# Patient Record
Sex: Male | Born: 1940 | Race: Black or African American | Hispanic: No | Marital: Single | Smoking: Never smoker
Health system: Southern US, Community
[De-identification: ages and names within clinical notes are randomized; demographics above are authoritative.]

## PROBLEM LIST (undated history)

## (undated) DIAGNOSIS — I10 Essential (primary) hypertension: Secondary | ICD-10-CM

## (undated) DIAGNOSIS — K3184 Gastroparesis: Secondary | ICD-10-CM

## (undated) DIAGNOSIS — E1143 Type 2 diabetes mellitus with diabetic autonomic (poly)neuropathy: Secondary | ICD-10-CM

## (undated) DIAGNOSIS — K5790 Diverticulosis of intestine, part unspecified, without perforation or abscess without bleeding: Secondary | ICD-10-CM

## (undated) DIAGNOSIS — K219 Gastro-esophageal reflux disease without esophagitis: Secondary | ICD-10-CM

## (undated) DIAGNOSIS — I864 Gastric varices: Secondary | ICD-10-CM

## (undated) DIAGNOSIS — K859 Acute pancreatitis without necrosis or infection, unspecified: Secondary | ICD-10-CM

## (undated) DIAGNOSIS — K2970 Gastritis, unspecified, without bleeding: Secondary | ICD-10-CM

## (undated) DIAGNOSIS — B1920 Unspecified viral hepatitis C without hepatic coma: Secondary | ICD-10-CM

## (undated) DIAGNOSIS — C189 Malignant neoplasm of colon, unspecified: Secondary | ICD-10-CM

## (undated) DIAGNOSIS — D649 Anemia, unspecified: Secondary | ICD-10-CM

## (undated) DIAGNOSIS — B370 Candidal stomatitis: Secondary | ICD-10-CM

## (undated) DIAGNOSIS — K635 Polyp of colon: Secondary | ICD-10-CM

## (undated) DIAGNOSIS — C801 Malignant (primary) neoplasm, unspecified: Secondary | ICD-10-CM

## (undated) DIAGNOSIS — H269 Unspecified cataract: Secondary | ICD-10-CM

## (undated) DIAGNOSIS — K7460 Unspecified cirrhosis of liver: Secondary | ICD-10-CM

## (undated) DIAGNOSIS — K469 Unspecified abdominal hernia without obstruction or gangrene: Secondary | ICD-10-CM

## (undated) DIAGNOSIS — K8020 Calculus of gallbladder without cholecystitis without obstruction: Secondary | ICD-10-CM

## (undated) DIAGNOSIS — K922 Gastrointestinal hemorrhage, unspecified: Secondary | ICD-10-CM

## (undated) HISTORY — DX: Polyp of colon: K63.5

## (undated) HISTORY — DX: Unspecified abdominal hernia without obstruction or gangrene: K46.9

## (undated) HISTORY — DX: Acute pancreatitis without necrosis or infection, unspecified: K85.90

## (undated) HISTORY — DX: Gastroparesis: K31.84

## (undated) HISTORY — DX: Candidal stomatitis: B37.0

## (undated) HISTORY — PX: HERNIA REPAIR: SHX51

## (undated) HISTORY — DX: Malignant neoplasm of colon, unspecified: C18.9

## (undated) HISTORY — DX: Type 2 diabetes mellitus with diabetic autonomic (poly)neuropathy: E11.43

## (undated) HISTORY — DX: Unspecified cirrhosis of liver: K74.60

## (undated) HISTORY — DX: Gastrointestinal hemorrhage, unspecified: K92.2

## (undated) HISTORY — DX: Gastro-esophageal reflux disease without esophagitis: K21.9

## (undated) HISTORY — PX: COLON SURGERY: SHX602

## (undated) HISTORY — DX: Gastritis, unspecified, without bleeding: K29.70

## (undated) HISTORY — DX: Diverticulosis of intestine, part unspecified, without perforation or abscess without bleeding: K57.90

## (undated) HISTORY — DX: Gastric varices: I86.4

## (undated) HISTORY — DX: Essential (primary) hypertension: I10

## (undated) HISTORY — DX: Calculus of gallbladder without cholecystitis without obstruction: K80.20

## (undated) HISTORY — DX: Unspecified cataract: H26.9

## (undated) HISTORY — DX: Unspecified viral hepatitis C without hepatic coma: B19.20

## (undated) HISTORY — DX: Anemia, unspecified: D64.9

---

## 1998-04-25 ENCOUNTER — Emergency Department (HOSPITAL_COMMUNITY): Admission: EM | Admit: 1998-04-25 | Discharge: 1998-04-25 | Payer: Self-pay | Admitting: Emergency Medicine

## 1998-05-04 ENCOUNTER — Encounter: Admission: RE | Admit: 1998-05-04 | Discharge: 1998-05-04 | Payer: Self-pay | Admitting: Hematology and Oncology

## 1998-06-15 ENCOUNTER — Encounter: Admission: RE | Admit: 1998-06-15 | Discharge: 1998-06-15 | Payer: Self-pay | Admitting: Internal Medicine

## 1998-09-26 ENCOUNTER — Emergency Department (HOSPITAL_COMMUNITY): Admission: EM | Admit: 1998-09-26 | Discharge: 1998-09-26 | Payer: Self-pay | Admitting: Emergency Medicine

## 1998-10-20 ENCOUNTER — Encounter: Admission: RE | Admit: 1998-10-20 | Discharge: 1998-10-20 | Payer: Self-pay | Admitting: Internal Medicine

## 1999-01-16 ENCOUNTER — Encounter: Admission: RE | Admit: 1999-01-16 | Discharge: 1999-01-16 | Payer: Self-pay | Admitting: Internal Medicine

## 1999-01-19 ENCOUNTER — Ambulatory Visit (HOSPITAL_COMMUNITY): Admission: RE | Admit: 1999-01-19 | Discharge: 1999-01-19 | Payer: Self-pay | Admitting: Internal Medicine

## 1999-02-10 ENCOUNTER — Ambulatory Visit (HOSPITAL_BASED_OUTPATIENT_CLINIC_OR_DEPARTMENT_OTHER): Admission: RE | Admit: 1999-02-10 | Discharge: 1999-02-10 | Payer: Self-pay | Admitting: Surgery

## 1999-05-08 ENCOUNTER — Encounter: Admission: RE | Admit: 1999-05-08 | Discharge: 1999-05-08 | Payer: Self-pay | Admitting: Internal Medicine

## 1999-11-28 ENCOUNTER — Encounter: Admission: RE | Admit: 1999-11-28 | Discharge: 1999-11-28 | Payer: Self-pay | Admitting: Internal Medicine

## 1999-12-11 ENCOUNTER — Encounter: Payer: Self-pay | Admitting: Emergency Medicine

## 1999-12-11 ENCOUNTER — Encounter: Payer: Self-pay | Admitting: *Deleted

## 1999-12-11 ENCOUNTER — Encounter (INDEPENDENT_AMBULATORY_CARE_PROVIDER_SITE_OTHER): Payer: Self-pay | Admitting: *Deleted

## 1999-12-11 ENCOUNTER — Inpatient Hospital Stay (HOSPITAL_COMMUNITY): Admission: EM | Admit: 1999-12-11 | Discharge: 1999-12-14 | Payer: Self-pay | Admitting: Emergency Medicine

## 1999-12-19 ENCOUNTER — Encounter: Admission: RE | Admit: 1999-12-19 | Discharge: 1999-12-19 | Payer: Self-pay | Admitting: Internal Medicine

## 2000-01-18 ENCOUNTER — Encounter: Admission: RE | Admit: 2000-01-18 | Discharge: 2000-01-18 | Payer: Self-pay | Admitting: Internal Medicine

## 2000-10-07 ENCOUNTER — Encounter: Admission: RE | Admit: 2000-10-07 | Discharge: 2000-10-07 | Payer: Self-pay | Admitting: Internal Medicine

## 2001-02-01 ENCOUNTER — Emergency Department (HOSPITAL_COMMUNITY): Admission: EM | Admit: 2001-02-01 | Discharge: 2001-02-01 | Payer: Self-pay | Admitting: Emergency Medicine

## 2001-02-03 ENCOUNTER — Encounter: Admission: RE | Admit: 2001-02-03 | Discharge: 2001-02-03 | Payer: Self-pay | Admitting: Internal Medicine

## 2001-02-06 ENCOUNTER — Emergency Department (HOSPITAL_COMMUNITY): Admission: EM | Admit: 2001-02-06 | Discharge: 2001-02-06 | Payer: Self-pay | Admitting: Emergency Medicine

## 2001-02-25 ENCOUNTER — Encounter: Payer: Self-pay | Admitting: Urology

## 2001-02-25 ENCOUNTER — Ambulatory Visit: Admission: RE | Admit: 2001-02-25 | Discharge: 2001-02-25 | Payer: Self-pay | Admitting: Urology

## 2001-03-13 ENCOUNTER — Encounter: Payer: Self-pay | Admitting: Oncology

## 2001-03-13 ENCOUNTER — Ambulatory Visit (HOSPITAL_COMMUNITY): Admission: RE | Admit: 2001-03-13 | Discharge: 2001-03-13 | Payer: Self-pay | Admitting: Oncology

## 2001-04-06 ENCOUNTER — Encounter: Payer: Self-pay | Admitting: Emergency Medicine

## 2001-04-06 ENCOUNTER — Emergency Department (HOSPITAL_COMMUNITY): Admission: EM | Admit: 2001-04-06 | Discharge: 2001-04-06 | Payer: Self-pay | Admitting: *Deleted

## 2001-04-07 ENCOUNTER — Inpatient Hospital Stay (HOSPITAL_COMMUNITY): Admission: AD | Admit: 2001-04-07 | Discharge: 2001-04-11 | Payer: Self-pay | Admitting: Internal Medicine

## 2001-04-07 ENCOUNTER — Encounter: Admission: RE | Admit: 2001-04-07 | Discharge: 2001-04-07 | Payer: Self-pay | Admitting: Internal Medicine

## 2001-04-07 ENCOUNTER — Encounter: Payer: Self-pay | Admitting: Internal Medicine

## 2001-04-08 ENCOUNTER — Encounter: Payer: Self-pay | Admitting: Internal Medicine

## 2001-04-29 ENCOUNTER — Inpatient Hospital Stay (HOSPITAL_COMMUNITY): Admission: RE | Admit: 2001-04-29 | Discharge: 2001-04-30 | Payer: Self-pay | Admitting: Urology

## 2001-04-29 ENCOUNTER — Encounter (INDEPENDENT_AMBULATORY_CARE_PROVIDER_SITE_OTHER): Payer: Self-pay

## 2001-05-06 ENCOUNTER — Encounter: Admission: RE | Admit: 2001-05-06 | Discharge: 2001-05-06 | Payer: Self-pay | Admitting: Internal Medicine

## 2001-05-07 ENCOUNTER — Encounter: Admission: RE | Admit: 2001-05-07 | Discharge: 2001-05-07 | Payer: Self-pay | Admitting: Hematology and Oncology

## 2001-05-08 ENCOUNTER — Encounter: Admission: RE | Admit: 2001-05-08 | Discharge: 2001-05-08 | Payer: Self-pay | Admitting: Hematology and Oncology

## 2001-11-12 ENCOUNTER — Encounter: Admission: RE | Admit: 2001-11-12 | Discharge: 2001-11-12 | Payer: Self-pay

## 2001-12-14 ENCOUNTER — Encounter (INDEPENDENT_AMBULATORY_CARE_PROVIDER_SITE_OTHER): Payer: Self-pay | Admitting: *Deleted

## 2001-12-14 ENCOUNTER — Inpatient Hospital Stay (HOSPITAL_COMMUNITY): Admission: EM | Admit: 2001-12-14 | Discharge: 2001-12-16 | Payer: Self-pay | Admitting: Emergency Medicine

## 2001-12-18 ENCOUNTER — Emergency Department (HOSPITAL_COMMUNITY): Admission: EM | Admit: 2001-12-18 | Discharge: 2001-12-18 | Payer: Self-pay

## 2001-12-19 ENCOUNTER — Emergency Department (HOSPITAL_COMMUNITY): Admission: EM | Admit: 2001-12-19 | Discharge: 2001-12-19 | Payer: Self-pay

## 2001-12-20 ENCOUNTER — Emergency Department (HOSPITAL_COMMUNITY): Admission: EM | Admit: 2001-12-20 | Discharge: 2001-12-20 | Payer: Self-pay | Admitting: Emergency Medicine

## 2001-12-22 ENCOUNTER — Encounter (INDEPENDENT_AMBULATORY_CARE_PROVIDER_SITE_OTHER): Payer: Self-pay | Admitting: *Deleted

## 2001-12-23 ENCOUNTER — Inpatient Hospital Stay (HOSPITAL_COMMUNITY): Admission: EM | Admit: 2001-12-23 | Discharge: 2001-12-24 | Payer: Self-pay | Admitting: Emergency Medicine

## 2001-12-23 ENCOUNTER — Encounter (INDEPENDENT_AMBULATORY_CARE_PROVIDER_SITE_OTHER): Payer: Self-pay | Admitting: *Deleted

## 2002-01-22 ENCOUNTER — Encounter: Admission: RE | Admit: 2002-01-22 | Discharge: 2002-01-22 | Payer: Self-pay | Admitting: Internal Medicine

## 2002-03-03 ENCOUNTER — Encounter: Admission: RE | Admit: 2002-03-03 | Discharge: 2002-03-03 | Payer: Self-pay | Admitting: Internal Medicine

## 2002-03-06 ENCOUNTER — Encounter: Admission: RE | Admit: 2002-03-06 | Discharge: 2002-03-06 | Payer: Self-pay

## 2002-04-16 ENCOUNTER — Encounter: Admission: RE | Admit: 2002-04-16 | Discharge: 2002-04-16 | Payer: Self-pay

## 2002-05-24 ENCOUNTER — Inpatient Hospital Stay (HOSPITAL_COMMUNITY): Admission: EM | Admit: 2002-05-24 | Discharge: 2002-05-30 | Payer: Self-pay

## 2002-05-25 ENCOUNTER — Encounter (INDEPENDENT_AMBULATORY_CARE_PROVIDER_SITE_OTHER): Payer: Self-pay | Admitting: *Deleted

## 2002-06-04 ENCOUNTER — Emergency Department (HOSPITAL_COMMUNITY): Admission: EM | Admit: 2002-06-04 | Discharge: 2002-06-04 | Payer: Self-pay | Admitting: Emergency Medicine

## 2002-06-05 ENCOUNTER — Encounter: Payer: Self-pay | Admitting: Emergency Medicine

## 2002-06-06 ENCOUNTER — Inpatient Hospital Stay (HOSPITAL_COMMUNITY): Admission: EM | Admit: 2002-06-06 | Discharge: 2002-06-09 | Payer: Self-pay | Admitting: Emergency Medicine

## 2002-06-06 ENCOUNTER — Encounter (INDEPENDENT_AMBULATORY_CARE_PROVIDER_SITE_OTHER): Payer: Self-pay | Admitting: *Deleted

## 2002-06-12 ENCOUNTER — Encounter: Admission: RE | Admit: 2002-06-12 | Discharge: 2002-06-12 | Payer: Self-pay | Admitting: Internal Medicine

## 2002-07-17 ENCOUNTER — Encounter: Admission: RE | Admit: 2002-07-17 | Discharge: 2002-07-17 | Payer: Self-pay | Admitting: Internal Medicine

## 2002-10-30 ENCOUNTER — Encounter: Admission: RE | Admit: 2002-10-30 | Discharge: 2002-10-30 | Payer: Self-pay | Admitting: Internal Medicine

## 2003-03-27 ENCOUNTER — Emergency Department (HOSPITAL_COMMUNITY): Admission: EM | Admit: 2003-03-27 | Discharge: 2003-03-27 | Payer: Self-pay | Admitting: Emergency Medicine

## 2003-03-27 ENCOUNTER — Encounter: Payer: Self-pay | Admitting: Emergency Medicine

## 2003-04-06 ENCOUNTER — Encounter: Admission: RE | Admit: 2003-04-06 | Discharge: 2003-04-06 | Payer: Self-pay | Admitting: Internal Medicine

## 2003-05-19 ENCOUNTER — Encounter: Admission: RE | Admit: 2003-05-19 | Discharge: 2003-05-19 | Payer: Self-pay | Admitting: Internal Medicine

## 2003-05-26 ENCOUNTER — Encounter: Admission: RE | Admit: 2003-05-26 | Discharge: 2003-05-26 | Payer: Self-pay | Admitting: Internal Medicine

## 2004-03-11 ENCOUNTER — Inpatient Hospital Stay (HOSPITAL_COMMUNITY): Admission: EM | Admit: 2004-03-11 | Discharge: 2004-03-13 | Payer: Self-pay

## 2004-03-13 ENCOUNTER — Encounter: Payer: Self-pay | Admitting: Internal Medicine

## 2004-04-18 ENCOUNTER — Encounter: Admission: RE | Admit: 2004-04-18 | Discharge: 2004-04-18 | Payer: Self-pay | Admitting: Internal Medicine

## 2004-04-26 ENCOUNTER — Ambulatory Visit (HOSPITAL_COMMUNITY): Admission: RE | Admit: 2004-04-26 | Discharge: 2004-04-26 | Payer: Self-pay | Admitting: Nephrology

## 2004-04-26 ENCOUNTER — Encounter (INDEPENDENT_AMBULATORY_CARE_PROVIDER_SITE_OTHER): Payer: Self-pay | Admitting: *Deleted

## 2004-04-27 ENCOUNTER — Encounter: Admission: RE | Admit: 2004-04-27 | Discharge: 2004-04-27 | Payer: Self-pay | Admitting: Internal Medicine

## 2004-05-09 ENCOUNTER — Encounter: Admission: RE | Admit: 2004-05-09 | Discharge: 2004-05-09 | Payer: Self-pay | Admitting: Internal Medicine

## 2004-05-10 ENCOUNTER — Encounter: Admission: RE | Admit: 2004-05-10 | Discharge: 2004-05-10 | Payer: Self-pay | Admitting: Internal Medicine

## 2004-05-15 ENCOUNTER — Encounter: Admission: RE | Admit: 2004-05-15 | Discharge: 2004-08-13 | Payer: Self-pay | Admitting: Hospitalist

## 2004-05-23 ENCOUNTER — Encounter: Admission: RE | Admit: 2004-05-23 | Discharge: 2004-05-23 | Payer: Self-pay | Admitting: Internal Medicine

## 2004-10-29 ENCOUNTER — Ambulatory Visit: Payer: Self-pay | Admitting: Internal Medicine

## 2004-10-29 ENCOUNTER — Inpatient Hospital Stay (HOSPITAL_COMMUNITY): Admission: EM | Admit: 2004-10-29 | Discharge: 2004-11-01 | Payer: Self-pay | Admitting: *Deleted

## 2004-10-30 ENCOUNTER — Encounter (INDEPENDENT_AMBULATORY_CARE_PROVIDER_SITE_OTHER): Payer: Self-pay | Admitting: *Deleted

## 2004-10-31 ENCOUNTER — Encounter (INDEPENDENT_AMBULATORY_CARE_PROVIDER_SITE_OTHER): Payer: Self-pay | Admitting: *Deleted

## 2004-11-01 ENCOUNTER — Encounter (INDEPENDENT_AMBULATORY_CARE_PROVIDER_SITE_OTHER): Payer: Self-pay | Admitting: *Deleted

## 2004-11-08 ENCOUNTER — Ambulatory Visit: Payer: Self-pay | Admitting: Internal Medicine

## 2004-11-17 ENCOUNTER — Ambulatory Visit: Payer: Self-pay | Admitting: Internal Medicine

## 2004-12-14 ENCOUNTER — Ambulatory Visit: Payer: Self-pay | Admitting: Internal Medicine

## 2005-04-12 ENCOUNTER — Inpatient Hospital Stay (HOSPITAL_COMMUNITY): Admission: EM | Admit: 2005-04-12 | Discharge: 2005-04-14 | Payer: Self-pay | Admitting: Emergency Medicine

## 2005-04-12 ENCOUNTER — Ambulatory Visit: Payer: Self-pay | Admitting: Internal Medicine

## 2005-04-14 ENCOUNTER — Encounter (INDEPENDENT_AMBULATORY_CARE_PROVIDER_SITE_OTHER): Payer: Self-pay | Admitting: *Deleted

## 2005-04-25 ENCOUNTER — Ambulatory Visit: Payer: Self-pay | Admitting: Internal Medicine

## 2005-05-15 ENCOUNTER — Emergency Department (HOSPITAL_COMMUNITY): Admission: EM | Admit: 2005-05-15 | Discharge: 2005-05-16 | Payer: Self-pay | Admitting: Emergency Medicine

## 2005-05-15 ENCOUNTER — Encounter (INDEPENDENT_AMBULATORY_CARE_PROVIDER_SITE_OTHER): Payer: Self-pay | Admitting: *Deleted

## 2005-05-17 ENCOUNTER — Ambulatory Visit: Payer: Self-pay | Admitting: Internal Medicine

## 2005-05-17 ENCOUNTER — Inpatient Hospital Stay (HOSPITAL_COMMUNITY): Admission: AD | Admit: 2005-05-17 | Discharge: 2005-05-20 | Payer: Self-pay | Admitting: Infectious Diseases

## 2005-05-17 ENCOUNTER — Ambulatory Visit: Payer: Self-pay | Admitting: Infectious Diseases

## 2005-06-13 ENCOUNTER — Ambulatory Visit: Payer: Self-pay | Admitting: Internal Medicine

## 2005-07-14 ENCOUNTER — Encounter (INDEPENDENT_AMBULATORY_CARE_PROVIDER_SITE_OTHER): Payer: Self-pay | Admitting: *Deleted

## 2005-11-20 ENCOUNTER — Ambulatory Visit: Payer: Self-pay | Admitting: Internal Medicine

## 2006-02-15 ENCOUNTER — Ambulatory Visit: Payer: Self-pay | Admitting: Internal Medicine

## 2006-02-21 ENCOUNTER — Ambulatory Visit: Payer: Self-pay | Admitting: Internal Medicine

## 2006-03-27 ENCOUNTER — Ambulatory Visit: Payer: Self-pay | Admitting: Internal Medicine

## 2006-10-31 ENCOUNTER — Ambulatory Visit: Payer: Self-pay | Admitting: Internal Medicine

## 2006-11-07 ENCOUNTER — Ambulatory Visit: Payer: Self-pay | Admitting: Hospitalist

## 2006-11-18 ENCOUNTER — Ambulatory Visit: Payer: Self-pay | Admitting: Hospitalist

## 2006-12-02 ENCOUNTER — Ambulatory Visit: Payer: Self-pay | Admitting: Internal Medicine

## 2006-12-02 ENCOUNTER — Inpatient Hospital Stay (HOSPITAL_COMMUNITY): Admission: EM | Admit: 2006-12-02 | Discharge: 2006-12-04 | Payer: Self-pay | Admitting: Emergency Medicine

## 2006-12-04 ENCOUNTER — Encounter (INDEPENDENT_AMBULATORY_CARE_PROVIDER_SITE_OTHER): Payer: Self-pay | Admitting: *Deleted

## 2006-12-06 ENCOUNTER — Ambulatory Visit: Payer: Self-pay | Admitting: Internal Medicine

## 2006-12-08 ENCOUNTER — Inpatient Hospital Stay (HOSPITAL_COMMUNITY): Admission: EM | Admit: 2006-12-08 | Discharge: 2006-12-16 | Payer: Self-pay | Admitting: Emergency Medicine

## 2006-12-09 ENCOUNTER — Encounter (INDEPENDENT_AMBULATORY_CARE_PROVIDER_SITE_OTHER): Payer: Self-pay | Admitting: *Deleted

## 2006-12-12 ENCOUNTER — Encounter: Payer: Self-pay | Admitting: Internal Medicine

## 2006-12-12 ENCOUNTER — Encounter (INDEPENDENT_AMBULATORY_CARE_PROVIDER_SITE_OTHER): Payer: Self-pay | Admitting: Specialist

## 2006-12-13 ENCOUNTER — Encounter (INDEPENDENT_AMBULATORY_CARE_PROVIDER_SITE_OTHER): Payer: Self-pay | Admitting: *Deleted

## 2006-12-16 ENCOUNTER — Encounter (INDEPENDENT_AMBULATORY_CARE_PROVIDER_SITE_OTHER): Payer: Self-pay | Admitting: *Deleted

## 2006-12-18 ENCOUNTER — Ambulatory Visit: Payer: Self-pay | Admitting: Internal Medicine

## 2006-12-19 ENCOUNTER — Encounter (INDEPENDENT_AMBULATORY_CARE_PROVIDER_SITE_OTHER): Payer: Self-pay | Admitting: Internal Medicine

## 2006-12-19 ENCOUNTER — Ambulatory Visit: Payer: Self-pay | Admitting: *Deleted

## 2006-12-19 LAB — CONVERTED CEMR LAB
BUN: 2 mg/dL — ABNORMAL LOW (ref 6–23)
Chloride: 106 meq/L (ref 96–112)
Creatinine, Ser: 1 mg/dL (ref 0.40–1.50)
HCT: 34 % — ABNORMAL LOW (ref 41.0–49.0)
Hemoglobin: 11.3 g/dL — ABNORMAL LOW (ref 13.9–16.8)
MCHC: 33.3 g/dL (ref 33.1–35.4)
MCV: 102.2 fL — ABNORMAL HIGH (ref 78.8–100.0)
RDW: 14.4 % (ref 11.5–15.3)

## 2007-01-08 ENCOUNTER — Encounter (INDEPENDENT_AMBULATORY_CARE_PROVIDER_SITE_OTHER): Payer: Self-pay | Admitting: Internal Medicine

## 2007-01-08 ENCOUNTER — Ambulatory Visit: Payer: Self-pay | Admitting: Internal Medicine

## 2007-01-08 LAB — CONVERTED CEMR LAB
Calcium: 9.6 mg/dL (ref 8.4–10.5)
Glucose, Bld: 249 mg/dL — ABNORMAL HIGH (ref 70–99)
Magnesium: 1.4 mg/dL — ABNORMAL LOW (ref 1.5–2.5)
Sodium: 132 meq/L — ABNORMAL LOW (ref 135–145)

## 2007-01-17 ENCOUNTER — Ambulatory Visit: Payer: Self-pay | Admitting: Internal Medicine

## 2007-01-17 ENCOUNTER — Encounter (INDEPENDENT_AMBULATORY_CARE_PROVIDER_SITE_OTHER): Payer: Self-pay | Admitting: Internal Medicine

## 2007-01-27 ENCOUNTER — Encounter (INDEPENDENT_AMBULATORY_CARE_PROVIDER_SITE_OTHER): Payer: Self-pay | Admitting: Internal Medicine

## 2007-02-13 ENCOUNTER — Encounter (INDEPENDENT_AMBULATORY_CARE_PROVIDER_SITE_OTHER): Payer: Self-pay | Admitting: Internal Medicine

## 2007-02-13 ENCOUNTER — Ambulatory Visit: Payer: Self-pay | Admitting: Hospitalist

## 2007-03-25 ENCOUNTER — Encounter (INDEPENDENT_AMBULATORY_CARE_PROVIDER_SITE_OTHER): Payer: Self-pay | Admitting: *Deleted

## 2007-03-25 ENCOUNTER — Ambulatory Visit: Payer: Self-pay | Admitting: Infectious Diseases

## 2007-03-25 ENCOUNTER — Inpatient Hospital Stay (HOSPITAL_COMMUNITY): Admission: EM | Admit: 2007-03-25 | Discharge: 2007-03-27 | Payer: Self-pay | Admitting: Emergency Medicine

## 2007-03-26 ENCOUNTER — Encounter (INDEPENDENT_AMBULATORY_CARE_PROVIDER_SITE_OTHER): Payer: Self-pay | Admitting: *Deleted

## 2007-03-27 ENCOUNTER — Encounter (INDEPENDENT_AMBULATORY_CARE_PROVIDER_SITE_OTHER): Payer: Self-pay | Admitting: Internal Medicine

## 2007-03-27 ENCOUNTER — Encounter (INDEPENDENT_AMBULATORY_CARE_PROVIDER_SITE_OTHER): Payer: Self-pay | Admitting: *Deleted

## 2007-03-27 ENCOUNTER — Telehealth: Payer: Self-pay | Admitting: *Deleted

## 2007-03-28 DIAGNOSIS — E1149 Type 2 diabetes mellitus with other diabetic neurological complication: Secondary | ICD-10-CM

## 2007-03-28 DIAGNOSIS — B1710 Acute hepatitis C without hepatic coma: Secondary | ICD-10-CM

## 2007-03-28 DIAGNOSIS — K7460 Unspecified cirrhosis of liver: Secondary | ICD-10-CM | POA: Insufficient documentation

## 2007-03-28 DIAGNOSIS — F1010 Alcohol abuse, uncomplicated: Secondary | ICD-10-CM | POA: Insufficient documentation

## 2007-03-28 DIAGNOSIS — K861 Other chronic pancreatitis: Secondary | ICD-10-CM

## 2007-03-30 ENCOUNTER — Emergency Department (HOSPITAL_COMMUNITY): Admission: EM | Admit: 2007-03-30 | Discharge: 2007-03-30 | Payer: Self-pay | Admitting: Emergency Medicine

## 2007-04-17 ENCOUNTER — Ambulatory Visit: Payer: Self-pay | Admitting: Internal Medicine

## 2007-04-17 DIAGNOSIS — K8020 Calculus of gallbladder without cholecystitis without obstruction: Secondary | ICD-10-CM | POA: Insufficient documentation

## 2007-04-17 DIAGNOSIS — K2990 Gastroduodenitis, unspecified, without bleeding: Secondary | ICD-10-CM

## 2007-04-17 DIAGNOSIS — K2970 Gastritis, unspecified, without bleeding: Secondary | ICD-10-CM | POA: Insufficient documentation

## 2007-05-15 ENCOUNTER — Telehealth (INDEPENDENT_AMBULATORY_CARE_PROVIDER_SITE_OTHER): Payer: Self-pay | Admitting: *Deleted

## 2007-07-21 ENCOUNTER — Telehealth (INDEPENDENT_AMBULATORY_CARE_PROVIDER_SITE_OTHER): Payer: Self-pay | Admitting: *Deleted

## 2007-08-07 ENCOUNTER — Ambulatory Visit: Payer: Self-pay | Admitting: Internal Medicine

## 2007-09-08 ENCOUNTER — Ambulatory Visit: Payer: Self-pay | Admitting: Internal Medicine

## 2007-09-08 ENCOUNTER — Encounter (INDEPENDENT_AMBULATORY_CARE_PROVIDER_SITE_OTHER): Payer: Self-pay | Admitting: *Deleted

## 2007-09-08 LAB — CONVERTED CEMR LAB: Vitamin B-12: 514 pg/mL (ref 211–911)

## 2007-09-19 ENCOUNTER — Telehealth: Payer: Self-pay | Admitting: *Deleted

## 2007-10-10 ENCOUNTER — Telehealth: Payer: Self-pay | Admitting: *Deleted

## 2007-10-22 ENCOUNTER — Telehealth: Payer: Self-pay | Admitting: *Deleted

## 2007-10-23 ENCOUNTER — Encounter (INDEPENDENT_AMBULATORY_CARE_PROVIDER_SITE_OTHER): Payer: Self-pay | Admitting: *Deleted

## 2007-10-23 ENCOUNTER — Ambulatory Visit: Payer: Self-pay | Admitting: Internal Medicine

## 2007-10-23 LAB — CONVERTED CEMR LAB
BUN: 10 mg/dL (ref 6–23)
Chloride: 101 meq/L (ref 96–112)
Glucose, Bld: 270 mg/dL — ABNORMAL HIGH (ref 70–99)
Potassium: 4.2 meq/L (ref 3.5–5.3)

## 2008-01-05 ENCOUNTER — Encounter (INDEPENDENT_AMBULATORY_CARE_PROVIDER_SITE_OTHER): Payer: Self-pay | Admitting: Internal Medicine

## 2008-01-05 ENCOUNTER — Ambulatory Visit: Payer: Self-pay | Admitting: Internal Medicine

## 2008-01-05 LAB — CONVERTED CEMR LAB: Hgb A1c MFr Bld: 8.6 %

## 2008-01-13 ENCOUNTER — Encounter (INDEPENDENT_AMBULATORY_CARE_PROVIDER_SITE_OTHER): Payer: Self-pay | Admitting: Internal Medicine

## 2008-01-13 ENCOUNTER — Ambulatory Visit: Payer: Self-pay | Admitting: Internal Medicine

## 2008-01-13 LAB — CONVERTED CEMR LAB: Hgb A1c MFr Bld: 8.5 %

## 2008-01-14 ENCOUNTER — Ambulatory Visit (HOSPITAL_COMMUNITY): Admission: RE | Admit: 2008-01-14 | Discharge: 2008-01-14 | Payer: Self-pay | Admitting: Internal Medicine

## 2008-01-14 ENCOUNTER — Encounter (INDEPENDENT_AMBULATORY_CARE_PROVIDER_SITE_OTHER): Payer: Self-pay | Admitting: *Deleted

## 2008-01-14 LAB — CONVERTED CEMR LAB
AST: 46 units/L — ABNORMAL HIGH (ref 0–37)
Albumin: 3.5 g/dL (ref 3.5–5.2)
BUN: 8 mg/dL (ref 6–23)
Calcium: 9.4 mg/dL (ref 8.4–10.5)
Chloride: 104 meq/L (ref 96–112)
Eosinophils Relative: 5 % (ref 0–5)
Glucose, Bld: 222 mg/dL — ABNORMAL HIGH (ref 70–99)
HCT: 40.2 % (ref 39.0–52.0)
Hemoglobin: 13.3 g/dL (ref 13.0–17.0)
Lymphocytes Relative: 34 % (ref 12–46)
Lymphs Abs: 2 10*3/uL (ref 0.7–4.0)
Monocytes Absolute: 0.6 10*3/uL (ref 0.1–1.0)
Monocytes Relative: 10 % (ref 3–12)
Potassium: 4.6 meq/L (ref 3.5–5.3)
WBC: 5.9 10*3/uL (ref 4.0–10.5)

## 2008-01-15 ENCOUNTER — Ambulatory Visit: Payer: Self-pay | Admitting: Internal Medicine

## 2008-01-15 ENCOUNTER — Encounter (INDEPENDENT_AMBULATORY_CARE_PROVIDER_SITE_OTHER): Payer: Self-pay | Admitting: Internal Medicine

## 2008-01-15 ENCOUNTER — Encounter (INDEPENDENT_AMBULATORY_CARE_PROVIDER_SITE_OTHER): Payer: Self-pay | Admitting: Otolaryngology

## 2008-01-15 ENCOUNTER — Other Ambulatory Visit: Admission: RE | Admit: 2008-01-15 | Discharge: 2008-01-15 | Payer: Self-pay | Admitting: Otolaryngology

## 2008-01-15 DIAGNOSIS — R220 Localized swelling, mass and lump, head: Secondary | ICD-10-CM

## 2008-01-15 DIAGNOSIS — R221 Localized swelling, mass and lump, neck: Secondary | ICD-10-CM

## 2008-01-20 ENCOUNTER — Ambulatory Visit: Admission: RE | Admit: 2008-01-20 | Discharge: 2008-04-19 | Payer: Self-pay | Admitting: Radiation Oncology

## 2008-01-21 ENCOUNTER — Encounter (INDEPENDENT_AMBULATORY_CARE_PROVIDER_SITE_OTHER): Payer: Self-pay | Admitting: *Deleted

## 2008-01-23 ENCOUNTER — Ambulatory Visit (HOSPITAL_COMMUNITY): Admission: RE | Admit: 2008-01-23 | Discharge: 2008-01-23 | Payer: Self-pay | Admitting: Family Medicine

## 2008-01-26 ENCOUNTER — Ambulatory Visit: Payer: Self-pay | Admitting: Dentistry

## 2008-01-26 ENCOUNTER — Encounter: Admission: AD | Admit: 2008-01-26 | Discharge: 2008-01-26 | Payer: Self-pay | Admitting: Dentistry

## 2008-01-26 ENCOUNTER — Encounter (INDEPENDENT_AMBULATORY_CARE_PROVIDER_SITE_OTHER): Payer: Self-pay | Admitting: *Deleted

## 2008-01-27 LAB — COMPREHENSIVE METABOLIC PANEL
Alkaline Phosphatase: 146 U/L — ABNORMAL HIGH (ref 39–117)
BUN: 13 mg/dL (ref 6–23)
CO2: 23 mEq/L (ref 19–32)
Creatinine, Ser: 1.08 mg/dL (ref 0.40–1.50)
Glucose, Bld: 340 mg/dL — ABNORMAL HIGH (ref 70–99)
Sodium: 130 mEq/L — ABNORMAL LOW (ref 135–145)
Total Bilirubin: 0.7 mg/dL (ref 0.3–1.2)

## 2008-01-27 LAB — CBC WITH DIFFERENTIAL/PLATELET
Basophils Absolute: 0 10*3/uL (ref 0.0–0.1)
Eosinophils Absolute: 0.2 10*3/uL (ref 0.0–0.5)
HCT: 39.3 % (ref 38.7–49.9)
LYMPH%: 26.6 % (ref 14.0–48.0)
MCV: 90.2 fL (ref 81.6–98.0)
MONO#: 0.5 10*3/uL (ref 0.1–0.9)
MONO%: 10.3 % (ref 0.0–13.0)
NEUT#: 2.8 10*3/uL (ref 1.5–6.5)
NEUT%: 57.9 % (ref 40.0–75.0)
Platelets: 116 10*3/uL — ABNORMAL LOW (ref 145–400)
RBC: 4.35 10*6/uL (ref 4.20–5.71)

## 2008-01-27 LAB — LACTATE DEHYDROGENASE: LDH: 135 U/L (ref 94–250)

## 2008-01-30 ENCOUNTER — Ambulatory Visit: Payer: Self-pay | Admitting: Internal Medicine

## 2008-01-30 ENCOUNTER — Encounter (INDEPENDENT_AMBULATORY_CARE_PROVIDER_SITE_OTHER): Payer: Self-pay | Admitting: Internal Medicine

## 2008-01-30 DIAGNOSIS — C090 Malignant neoplasm of tonsillar fossa: Secondary | ICD-10-CM | POA: Insufficient documentation

## 2008-02-05 ENCOUNTER — Ambulatory Visit (HOSPITAL_COMMUNITY): Admission: RE | Admit: 2008-02-05 | Discharge: 2008-02-05 | Payer: Self-pay | Admitting: Radiation Oncology

## 2008-02-05 ENCOUNTER — Encounter (INDEPENDENT_AMBULATORY_CARE_PROVIDER_SITE_OTHER): Payer: Self-pay | Admitting: *Deleted

## 2008-02-06 ENCOUNTER — Encounter (INDEPENDENT_AMBULATORY_CARE_PROVIDER_SITE_OTHER): Payer: Self-pay | Admitting: *Deleted

## 2008-02-06 LAB — COMPREHENSIVE METABOLIC PANEL
ALT: 27 U/L (ref 0–53)
AST: 24 U/L (ref 0–37)
CO2: 23 mEq/L (ref 19–32)
Calcium: 9.2 mg/dL (ref 8.4–10.5)
Chloride: 99 mEq/L (ref 96–112)
Potassium: 4.6 mEq/L (ref 3.5–5.3)
Sodium: 134 mEq/L — ABNORMAL LOW (ref 135–145)
Total Protein: 8 g/dL (ref 6.0–8.3)

## 2008-02-06 LAB — CBC WITH DIFFERENTIAL/PLATELET
BASO%: 0.2 % (ref 0.0–2.0)
EOS%: 1 % (ref 0.0–7.0)
HCT: 41.1 % (ref 38.7–49.9)
MCH: 29.2 pg (ref 28.0–33.4)
MCHC: 32.1 g/dL (ref 32.0–35.9)
MONO#: 0.6 10*3/uL (ref 0.1–0.9)
NEUT%: 71.1 % (ref 40.0–75.0)
RBC: 4.53 10*6/uL (ref 4.20–5.71)
RDW: 14.1 % (ref 11.2–14.6)
WBC: 9.8 10*3/uL (ref 4.0–10.0)
lymph#: 2.1 10*3/uL (ref 0.9–3.3)

## 2008-02-06 LAB — MAGNESIUM: Magnesium: 1.6 mg/dL (ref 1.5–2.5)

## 2008-02-16 LAB — COMPREHENSIVE METABOLIC PANEL
Albumin: 3.8 g/dL (ref 3.5–5.2)
BUN: 10 mg/dL (ref 6–23)
Calcium: 9.4 mg/dL (ref 8.4–10.5)
Chloride: 100 mEq/L (ref 96–112)
Glucose, Bld: 262 mg/dL — ABNORMAL HIGH (ref 70–99)
Potassium: 4.5 mEq/L (ref 3.5–5.3)
Total Protein: 7.5 g/dL (ref 6.0–8.3)

## 2008-02-16 LAB — CBC WITH DIFFERENTIAL/PLATELET
BASO%: 1.4 % (ref 0.0–2.0)
Basophils Absolute: 0.1 10*3/uL (ref 0.0–0.1)
EOS%: 1.2 % (ref 0.0–7.0)
HGB: 13.3 g/dL (ref 13.0–17.1)
MCH: 30.9 pg (ref 28.0–33.4)
MCHC: 34.3 g/dL (ref 32.0–35.9)
MCV: 90 fL (ref 81.6–98.0)
MONO%: 9.3 % (ref 0.0–13.0)
NEUT%: 61.5 % (ref 40.0–75.0)
RDW: 12.7 % (ref 11.2–14.6)
lymph#: 1.7 10*3/uL (ref 0.9–3.3)

## 2008-02-19 ENCOUNTER — Encounter (INDEPENDENT_AMBULATORY_CARE_PROVIDER_SITE_OTHER): Payer: Self-pay | Admitting: *Deleted

## 2008-02-23 ENCOUNTER — Encounter (INDEPENDENT_AMBULATORY_CARE_PROVIDER_SITE_OTHER): Payer: Self-pay | Admitting: *Deleted

## 2008-02-23 LAB — COMPREHENSIVE METABOLIC PANEL
AST: 40 U/L — ABNORMAL HIGH (ref 0–37)
Albumin: 3.8 g/dL (ref 3.5–5.2)
Alkaline Phosphatase: 124 U/L — ABNORMAL HIGH (ref 39–117)
BUN: 16 mg/dL (ref 6–23)
Potassium: 4.4 mEq/L (ref 3.5–5.3)
Sodium: 138 mEq/L (ref 135–145)
Total Bilirubin: 0.9 mg/dL (ref 0.3–1.2)

## 2008-02-23 LAB — CBC WITH DIFFERENTIAL/PLATELET
BASO%: 0.3 % (ref 0.0–2.0)
Basophils Absolute: 0 10*3/uL (ref 0.0–0.1)
EOS%: 0.6 % (ref 0.0–7.0)
HCT: 36.2 % — ABNORMAL LOW (ref 38.7–49.9)
HGB: 12.4 g/dL — ABNORMAL LOW (ref 13.0–17.1)
LYMPH%: 14.6 % (ref 14.0–48.0)
MCH: 30.7 pg (ref 28.0–33.4)
MCHC: 34.2 g/dL (ref 32.0–35.9)
MONO#: 0.5 10*3/uL (ref 0.1–0.9)
NEUT%: 75.5 % — ABNORMAL HIGH (ref 40.0–75.0)
Platelets: 114 10*3/uL — ABNORMAL LOW (ref 145–400)

## 2008-02-26 ENCOUNTER — Ambulatory Visit: Payer: Self-pay | Admitting: Internal Medicine

## 2008-03-08 ENCOUNTER — Telehealth (INDEPENDENT_AMBULATORY_CARE_PROVIDER_SITE_OTHER): Payer: Self-pay | Admitting: *Deleted

## 2008-03-08 LAB — CBC WITH DIFFERENTIAL/PLATELET
BASO%: 0.7 % (ref 0.0–2.0)
LYMPH%: 20.4 % (ref 14.0–48.0)
MCHC: 34.8 g/dL (ref 32.0–35.9)
MONO#: 0.4 10*3/uL (ref 0.1–0.9)
Platelets: 48 10*3/uL — ABNORMAL LOW (ref 145–400)
RBC: 4 10*6/uL — ABNORMAL LOW (ref 4.20–5.71)
WBC: 3.3 10*3/uL — ABNORMAL LOW (ref 4.0–10.0)

## 2008-03-11 LAB — CBC WITH DIFFERENTIAL/PLATELET
BASO%: 0.1 % (ref 0.0–2.0)
HCT: 34.8 % — ABNORMAL LOW (ref 38.7–49.9)
MCHC: 34.3 g/dL (ref 32.0–35.9)
MONO#: 0.3 10*3/uL (ref 0.1–0.9)
NEUT%: 77.8 % — ABNORMAL HIGH (ref 40.0–75.0)
RBC: 3.86 10*6/uL — ABNORMAL LOW (ref 4.20–5.71)
RDW: 13.7 % (ref 11.2–14.6)
WBC: 3.9 10*3/uL — ABNORMAL LOW (ref 4.0–10.0)
lymph#: 0.6 10*3/uL — ABNORMAL LOW (ref 0.9–3.3)

## 2008-03-11 LAB — COMPREHENSIVE METABOLIC PANEL
Albumin: 3.8 g/dL (ref 3.5–5.2)
BUN: 18 mg/dL (ref 6–23)
CO2: 33 mEq/L — ABNORMAL HIGH (ref 19–32)
Calcium: 7.8 mg/dL — ABNORMAL LOW (ref 8.4–10.5)
Chloride: 90 mEq/L — ABNORMAL LOW (ref 96–112)
Glucose, Bld: 337 mg/dL — ABNORMAL HIGH (ref 70–99)
Potassium: 3.5 mEq/L (ref 3.5–5.3)
Total Protein: 6.6 g/dL (ref 6.0–8.3)

## 2008-03-11 LAB — MAGNESIUM: Magnesium: 1 mg/dL — ABNORMAL LOW (ref 1.5–2.5)

## 2008-03-12 ENCOUNTER — Encounter (INDEPENDENT_AMBULATORY_CARE_PROVIDER_SITE_OTHER): Payer: Self-pay | Admitting: *Deleted

## 2008-03-16 ENCOUNTER — Encounter (INDEPENDENT_AMBULATORY_CARE_PROVIDER_SITE_OTHER): Payer: Self-pay | Admitting: *Deleted

## 2008-03-17 ENCOUNTER — Ambulatory Visit: Payer: Self-pay | Admitting: Internal Medicine

## 2008-03-17 ENCOUNTER — Inpatient Hospital Stay (HOSPITAL_COMMUNITY): Admission: EM | Admit: 2008-03-17 | Discharge: 2008-03-19 | Payer: Self-pay | Admitting: Emergency Medicine

## 2008-03-22 ENCOUNTER — Encounter (INDEPENDENT_AMBULATORY_CARE_PROVIDER_SITE_OTHER): Payer: Self-pay | Admitting: *Deleted

## 2008-03-22 LAB — CBC WITH DIFFERENTIAL/PLATELET
BASO%: 4.3 % — ABNORMAL HIGH (ref 0.0–2.0)
Eosinophils Absolute: 0 10*3/uL (ref 0.0–0.5)
MCHC: 32.7 g/dL (ref 32.0–35.9)
MONO#: 0.5 10*3/uL (ref 0.1–0.9)
MONO%: 13 % (ref 0.0–13.0)
NEUT#: 2.8 10*3/uL (ref 1.5–6.5)
RBC: 3.38 10*6/uL — ABNORMAL LOW (ref 4.20–5.71)
RDW: 15.4 % — ABNORMAL HIGH (ref 11.2–14.6)
WBC: 4.2 10*3/uL (ref 4.0–10.0)

## 2008-03-22 LAB — COMPREHENSIVE METABOLIC PANEL
ALT: 33 U/L (ref 0–53)
AST: 29 U/L (ref 0–37)
CO2: 25 mEq/L (ref 19–32)
Chloride: 98 mEq/L (ref 96–112)
Sodium: 136 mEq/L (ref 135–145)
Total Bilirubin: 0.6 mg/dL (ref 0.3–1.2)
Total Protein: 6.7 g/dL (ref 6.0–8.3)

## 2008-03-22 LAB — TECHNOLOGIST REVIEW: Technologist Review: 1

## 2008-03-23 ENCOUNTER — Telehealth (INDEPENDENT_AMBULATORY_CARE_PROVIDER_SITE_OTHER): Payer: Self-pay | Admitting: *Deleted

## 2008-03-23 LAB — BASIC METABOLIC PANEL
Calcium: 8.6 mg/dL (ref 8.4–10.5)
Creatinine, Ser: 1.48 mg/dL (ref 0.40–1.50)
Glucose, Bld: 457 mg/dL — ABNORMAL HIGH (ref 70–99)
Sodium: 134 mEq/L — ABNORMAL LOW (ref 135–145)

## 2008-03-25 ENCOUNTER — Telehealth (INDEPENDENT_AMBULATORY_CARE_PROVIDER_SITE_OTHER): Payer: Self-pay | Admitting: *Deleted

## 2008-03-26 ENCOUNTER — Telehealth (INDEPENDENT_AMBULATORY_CARE_PROVIDER_SITE_OTHER): Payer: Self-pay | Admitting: Hospitalist

## 2008-03-26 ENCOUNTER — Ambulatory Visit: Payer: Self-pay | Admitting: Hospitalist

## 2008-03-26 ENCOUNTER — Inpatient Hospital Stay (HOSPITAL_COMMUNITY): Admission: AD | Admit: 2008-03-26 | Discharge: 2008-03-31 | Payer: Self-pay | Admitting: Internal Medicine

## 2008-03-26 ENCOUNTER — Ambulatory Visit: Payer: Self-pay | Admitting: Internal Medicine

## 2008-03-26 DIAGNOSIS — T50901A Poisoning by unspecified drugs, medicaments and biological substances, accidental (unintentional), initial encounter: Secondary | ICD-10-CM | POA: Insufficient documentation

## 2008-03-26 LAB — CONVERTED CEMR LAB: Blood Glucose, Fingerstick: 257

## 2008-03-28 ENCOUNTER — Encounter (INDEPENDENT_AMBULATORY_CARE_PROVIDER_SITE_OTHER): Payer: Self-pay | Admitting: *Deleted

## 2008-03-31 ENCOUNTER — Encounter (INDEPENDENT_AMBULATORY_CARE_PROVIDER_SITE_OTHER): Payer: Self-pay | Admitting: Internal Medicine

## 2008-03-31 ENCOUNTER — Encounter (INDEPENDENT_AMBULATORY_CARE_PROVIDER_SITE_OTHER): Payer: Self-pay | Admitting: *Deleted

## 2008-04-08 ENCOUNTER — Encounter (INDEPENDENT_AMBULATORY_CARE_PROVIDER_SITE_OTHER): Payer: Self-pay | Admitting: *Deleted

## 2008-04-13 ENCOUNTER — Encounter (INDEPENDENT_AMBULATORY_CARE_PROVIDER_SITE_OTHER): Payer: Self-pay | Admitting: *Deleted

## 2008-04-13 ENCOUNTER — Ambulatory Visit: Payer: Self-pay | Admitting: Internal Medicine

## 2008-04-13 LAB — CONVERTED CEMR LAB: Hgb A1c MFr Bld: 9.7 %

## 2008-04-14 ENCOUNTER — Encounter (INDEPENDENT_AMBULATORY_CARE_PROVIDER_SITE_OTHER): Payer: Self-pay | Admitting: *Deleted

## 2008-04-15 LAB — CONVERTED CEMR LAB
BUN: 28 mg/dL — ABNORMAL HIGH (ref 6–23)
Chloride: 95 meq/L — ABNORMAL LOW (ref 96–112)
Creatinine, Ser: 1.07 mg/dL (ref 0.40–1.50)
Glucose, Bld: 251 mg/dL — ABNORMAL HIGH (ref 70–99)

## 2008-04-19 ENCOUNTER — Encounter (INDEPENDENT_AMBULATORY_CARE_PROVIDER_SITE_OTHER): Payer: Self-pay | Admitting: *Deleted

## 2008-04-21 ENCOUNTER — Encounter (INDEPENDENT_AMBULATORY_CARE_PROVIDER_SITE_OTHER): Payer: Self-pay | Admitting: *Deleted

## 2008-04-28 ENCOUNTER — Encounter (INDEPENDENT_AMBULATORY_CARE_PROVIDER_SITE_OTHER): Payer: Self-pay | Admitting: *Deleted

## 2008-04-28 ENCOUNTER — Ambulatory Visit: Payer: Self-pay | Admitting: Infectious Disease

## 2008-04-29 ENCOUNTER — Telehealth (INDEPENDENT_AMBULATORY_CARE_PROVIDER_SITE_OTHER): Payer: Self-pay | Admitting: *Deleted

## 2008-04-29 DIAGNOSIS — E871 Hypo-osmolality and hyponatremia: Secondary | ICD-10-CM | POA: Insufficient documentation

## 2008-04-29 LAB — CONVERTED CEMR LAB
Calcium: 9.7 mg/dL (ref 8.4–10.5)
Creatinine, Ser: 1.08 mg/dL (ref 0.40–1.50)
Glucose, Bld: 303 mg/dL — ABNORMAL HIGH (ref 70–99)
Magnesium: 1.5 mg/dL (ref 1.5–2.5)
Sodium: 134 meq/L — ABNORMAL LOW (ref 135–145)

## 2008-05-03 ENCOUNTER — Encounter (INDEPENDENT_AMBULATORY_CARE_PROVIDER_SITE_OTHER): Payer: Self-pay | Admitting: *Deleted

## 2008-05-03 ENCOUNTER — Ambulatory Visit: Payer: Self-pay | Admitting: Internal Medicine

## 2008-05-03 DIAGNOSIS — B3781 Candidal esophagitis: Secondary | ICD-10-CM

## 2008-05-04 ENCOUNTER — Telehealth (INDEPENDENT_AMBULATORY_CARE_PROVIDER_SITE_OTHER): Payer: Self-pay | Admitting: *Deleted

## 2008-05-17 ENCOUNTER — Ambulatory Visit: Payer: Self-pay | Admitting: Dentistry

## 2008-05-19 ENCOUNTER — Telehealth (INDEPENDENT_AMBULATORY_CARE_PROVIDER_SITE_OTHER): Payer: Self-pay | Admitting: *Deleted

## 2008-05-20 ENCOUNTER — Ambulatory Visit: Payer: Self-pay | Admitting: Internal Medicine

## 2008-05-21 ENCOUNTER — Ambulatory Visit: Admission: RE | Admit: 2008-05-21 | Discharge: 2008-05-21 | Payer: Self-pay | Admitting: Radiation Oncology

## 2008-05-26 ENCOUNTER — Ambulatory Visit (HOSPITAL_COMMUNITY): Admission: RE | Admit: 2008-05-26 | Discharge: 2008-05-26 | Payer: Self-pay | Admitting: Internal Medicine

## 2008-05-27 ENCOUNTER — Ambulatory Visit: Payer: Self-pay | Admitting: Internal Medicine

## 2008-05-27 ENCOUNTER — Encounter (INDEPENDENT_AMBULATORY_CARE_PROVIDER_SITE_OTHER): Payer: Self-pay | Admitting: *Deleted

## 2008-05-27 LAB — CONVERTED CEMR LAB
AST: 58 units/L — ABNORMAL HIGH (ref 0–37)
Albumin: 3.9 g/dL (ref 3.5–5.2)
Alkaline Phosphatase: 143 units/L — ABNORMAL HIGH (ref 39–117)
BUN: 33 mg/dL — ABNORMAL HIGH (ref 6–23)
Cortisol, Plasma: 9.4 ug/dL
Creatinine, Ser: 1.17 mg/dL (ref 0.40–1.50)
Potassium: 5.9 meq/L — ABNORMAL HIGH (ref 3.5–5.3)
Total Bilirubin: 0.7 mg/dL (ref 0.3–1.2)

## 2008-05-30 LAB — CONVERTED CEMR LAB
CO2: 22 meq/L (ref 19–32)
Calcium: 9.6 mg/dL (ref 8.4–10.5)
Chloride: 101 meq/L (ref 96–112)
Creatinine, Ser: 1.21 mg/dL (ref 0.40–1.50)
Eosinophils Relative: 2 % (ref 0–5)
Glucose, Bld: 226 mg/dL — ABNORMAL HIGH (ref 70–99)
HCT: 34.4 % — ABNORMAL LOW (ref 39.0–52.0)
Hemoglobin: 10.9 g/dL — ABNORMAL LOW (ref 13.0–17.0)
Lymphocytes Relative: 28 % (ref 12–46)
Lymphs Abs: 1.3 10*3/uL (ref 0.7–4.0)
Monocytes Absolute: 0.7 10*3/uL (ref 0.1–1.0)
Monocytes Relative: 16 % — ABNORMAL HIGH (ref 3–12)
Neutro Abs: 2.5 10*3/uL (ref 1.7–7.7)
RBC: 3.41 M/uL — ABNORMAL LOW (ref 4.22–5.81)
WBC: 4.6 10*3/uL (ref 4.0–10.5)

## 2008-05-31 ENCOUNTER — Encounter (INDEPENDENT_AMBULATORY_CARE_PROVIDER_SITE_OTHER): Payer: Self-pay | Admitting: *Deleted

## 2008-06-10 ENCOUNTER — Encounter (INDEPENDENT_AMBULATORY_CARE_PROVIDER_SITE_OTHER): Payer: Self-pay | Admitting: *Deleted

## 2008-06-11 ENCOUNTER — Encounter (INDEPENDENT_AMBULATORY_CARE_PROVIDER_SITE_OTHER): Payer: Self-pay | Admitting: *Deleted

## 2008-07-01 ENCOUNTER — Encounter (INDEPENDENT_AMBULATORY_CARE_PROVIDER_SITE_OTHER): Payer: Self-pay | Admitting: *Deleted

## 2008-07-19 ENCOUNTER — Ambulatory Visit (HOSPITAL_COMMUNITY): Admission: RE | Admit: 2008-07-19 | Discharge: 2008-07-19 | Payer: Self-pay | Admitting: Internal Medicine

## 2008-07-21 ENCOUNTER — Encounter (INDEPENDENT_AMBULATORY_CARE_PROVIDER_SITE_OTHER): Payer: Self-pay | Admitting: *Deleted

## 2008-07-21 ENCOUNTER — Telehealth (INDEPENDENT_AMBULATORY_CARE_PROVIDER_SITE_OTHER): Payer: Self-pay | Admitting: *Deleted

## 2008-07-21 ENCOUNTER — Ambulatory Visit: Payer: Self-pay | Admitting: Internal Medicine

## 2008-07-22 ENCOUNTER — Encounter (INDEPENDENT_AMBULATORY_CARE_PROVIDER_SITE_OTHER): Payer: Self-pay | Admitting: *Deleted

## 2008-07-27 ENCOUNTER — Ambulatory Visit (HOSPITAL_COMMUNITY): Admission: RE | Admit: 2008-07-27 | Discharge: 2008-07-27 | Payer: Self-pay | Admitting: Urology

## 2008-08-02 ENCOUNTER — Encounter (INDEPENDENT_AMBULATORY_CARE_PROVIDER_SITE_OTHER): Payer: Self-pay | Admitting: *Deleted

## 2008-08-02 LAB — COMPREHENSIVE METABOLIC PANEL
ALT: 45 U/L (ref 0–53)
CO2: 27 mEq/L (ref 19–32)
Calcium: 9.9 mg/dL (ref 8.4–10.5)
Chloride: 103 mEq/L (ref 96–112)
Potassium: 4.8 mEq/L (ref 3.5–5.3)
Sodium: 137 mEq/L (ref 135–145)
Total Protein: 7.7 g/dL (ref 6.0–8.3)

## 2008-08-02 LAB — CBC WITH DIFFERENTIAL/PLATELET
BASO%: 0.3 % (ref 0.0–2.0)
MCHC: 33.7 g/dL (ref 32.0–35.9)
MONO#: 0.8 10*3/uL (ref 0.1–0.9)
RBC: 3.57 10*6/uL — ABNORMAL LOW (ref 4.20–5.71)
WBC: 4.5 10*3/uL (ref 4.0–10.0)
lymph#: 0.8 10*3/uL — ABNORMAL LOW (ref 0.9–3.3)

## 2008-08-05 ENCOUNTER — Telehealth (INDEPENDENT_AMBULATORY_CARE_PROVIDER_SITE_OTHER): Payer: Self-pay | Admitting: *Deleted

## 2008-08-17 ENCOUNTER — Ambulatory Visit: Admission: RE | Admit: 2008-08-17 | Discharge: 2008-11-15 | Payer: Self-pay | Admitting: Radiation Oncology

## 2008-08-17 ENCOUNTER — Telehealth (INDEPENDENT_AMBULATORY_CARE_PROVIDER_SITE_OTHER): Payer: Self-pay | Admitting: *Deleted

## 2008-08-18 ENCOUNTER — Encounter (INDEPENDENT_AMBULATORY_CARE_PROVIDER_SITE_OTHER): Payer: Self-pay | Admitting: *Deleted

## 2008-09-01 ENCOUNTER — Telehealth (INDEPENDENT_AMBULATORY_CARE_PROVIDER_SITE_OTHER): Payer: Self-pay | Admitting: *Deleted

## 2008-09-09 ENCOUNTER — Encounter (INDEPENDENT_AMBULATORY_CARE_PROVIDER_SITE_OTHER): Payer: Self-pay | Admitting: *Deleted

## 2008-09-29 ENCOUNTER — Telehealth (INDEPENDENT_AMBULATORY_CARE_PROVIDER_SITE_OTHER): Payer: Self-pay | Admitting: *Deleted

## 2008-10-14 ENCOUNTER — Encounter (INDEPENDENT_AMBULATORY_CARE_PROVIDER_SITE_OTHER): Payer: Self-pay | Admitting: *Deleted

## 2008-10-18 ENCOUNTER — Ambulatory Visit: Payer: Self-pay | Admitting: Dentistry

## 2008-10-20 ENCOUNTER — Encounter (INDEPENDENT_AMBULATORY_CARE_PROVIDER_SITE_OTHER): Payer: Self-pay | Admitting: *Deleted

## 2008-10-22 ENCOUNTER — Ambulatory Visit: Payer: Self-pay | Admitting: Internal Medicine

## 2008-10-26 ENCOUNTER — Ambulatory Visit (HOSPITAL_COMMUNITY): Admission: RE | Admit: 2008-10-26 | Discharge: 2008-10-26 | Payer: Self-pay | Admitting: Internal Medicine

## 2008-10-26 LAB — CBC WITH DIFFERENTIAL/PLATELET
BASO%: 0.5 % (ref 0.0–2.0)
Eosinophils Absolute: 0.2 10*3/uL (ref 0.0–0.5)
HCT: 36.2 % — ABNORMAL LOW (ref 38.7–49.9)
MCHC: 33.4 g/dL (ref 32.0–35.9)
MONO#: 0.6 10*3/uL (ref 0.1–0.9)
NEUT#: 2.5 10*3/uL (ref 1.5–6.5)
NEUT%: 61.5 % (ref 40.0–75.0)
WBC: 4.1 10*3/uL (ref 4.0–10.0)
lymph#: 0.7 10*3/uL — ABNORMAL LOW (ref 0.9–3.3)

## 2008-10-26 LAB — COMPREHENSIVE METABOLIC PANEL
ALT: 64 U/L — ABNORMAL HIGH (ref 0–53)
CO2: 26 mEq/L (ref 19–32)
Calcium: 9.9 mg/dL (ref 8.4–10.5)
Chloride: 100 mEq/L (ref 96–112)
Creatinine, Ser: 1.11 mg/dL (ref 0.40–1.50)
Glucose, Bld: 170 mg/dL — ABNORMAL HIGH (ref 70–99)
Sodium: 135 mEq/L (ref 135–145)
Total Protein: 7.8 g/dL (ref 6.0–8.3)

## 2008-11-08 ENCOUNTER — Telehealth (INDEPENDENT_AMBULATORY_CARE_PROVIDER_SITE_OTHER): Payer: Self-pay | Admitting: *Deleted

## 2008-11-11 ENCOUNTER — Telehealth (INDEPENDENT_AMBULATORY_CARE_PROVIDER_SITE_OTHER): Payer: Self-pay | Admitting: *Deleted

## 2008-11-16 ENCOUNTER — Ambulatory Visit: Admission: RE | Admit: 2008-11-16 | Discharge: 2008-12-20 | Payer: Self-pay | Admitting: Radiation Oncology

## 2008-11-17 ENCOUNTER — Telehealth (INDEPENDENT_AMBULATORY_CARE_PROVIDER_SITE_OTHER): Payer: Self-pay | Admitting: *Deleted

## 2008-11-23 ENCOUNTER — Ambulatory Visit: Payer: Self-pay | Admitting: Dentistry

## 2008-11-24 DIAGNOSIS — C61 Malignant neoplasm of prostate: Secondary | ICD-10-CM

## 2008-12-08 ENCOUNTER — Telehealth (INDEPENDENT_AMBULATORY_CARE_PROVIDER_SITE_OTHER): Payer: Self-pay | Admitting: *Deleted

## 2008-12-10 ENCOUNTER — Encounter (INDEPENDENT_AMBULATORY_CARE_PROVIDER_SITE_OTHER): Payer: Self-pay | Admitting: *Deleted

## 2008-12-14 ENCOUNTER — Telehealth (INDEPENDENT_AMBULATORY_CARE_PROVIDER_SITE_OTHER): Payer: Self-pay | Admitting: *Deleted

## 2008-12-14 ENCOUNTER — Ambulatory Visit (HOSPITAL_COMMUNITY): Admission: RE | Admit: 2008-12-14 | Discharge: 2008-12-14 | Payer: Self-pay | Admitting: Radiation Oncology

## 2008-12-14 ENCOUNTER — Encounter (INDEPENDENT_AMBULATORY_CARE_PROVIDER_SITE_OTHER): Payer: Self-pay | Admitting: *Deleted

## 2009-01-02 ENCOUNTER — Encounter (INDEPENDENT_AMBULATORY_CARE_PROVIDER_SITE_OTHER): Payer: Self-pay | Admitting: *Deleted

## 2009-01-03 ENCOUNTER — Ambulatory Visit: Payer: Self-pay | Admitting: Internal Medicine

## 2009-01-03 LAB — CONVERTED CEMR LAB: Blood Glucose, Fingerstick: 94

## 2009-01-06 ENCOUNTER — Encounter (INDEPENDENT_AMBULATORY_CARE_PROVIDER_SITE_OTHER): Payer: Self-pay | Admitting: *Deleted

## 2009-01-06 ENCOUNTER — Ambulatory Visit: Payer: Self-pay | Admitting: Internal Medicine

## 2009-01-06 LAB — CONVERTED CEMR LAB
ALT: 52 units/L (ref 0–53)
CO2: 22 meq/L (ref 19–32)
Calcium: 9.4 mg/dL (ref 8.4–10.5)
Chloride: 105 meq/L (ref 96–112)
Cholesterol: 167 mg/dL (ref 0–200)
Glucose, Bld: 128 mg/dL — ABNORMAL HIGH (ref 70–99)
HCT: 36.6 % — ABNORMAL LOW (ref 39.0–52.0)
MCV: 97.1 fL (ref 78.0–100.0)
Microalb Creat Ratio: 8.1 mg/g (ref 0.0–30.0)
Platelets: 67 10*3/uL — ABNORMAL LOW (ref 150–400)
RBC: 3.77 M/uL — ABNORMAL LOW (ref 4.22–5.81)
Sodium: 141 meq/L (ref 135–145)
Total Bilirubin: 0.7 mg/dL (ref 0.3–1.2)
Total Protein: 7.2 g/dL (ref 6.0–8.3)
Triglycerides: 77 mg/dL (ref <150)
WBC: 2.6 10*3/uL — ABNORMAL LOW (ref 4.0–10.5)

## 2009-01-12 ENCOUNTER — Ambulatory Visit: Payer: Self-pay | Admitting: Dentistry

## 2009-01-13 ENCOUNTER — Telehealth (INDEPENDENT_AMBULATORY_CARE_PROVIDER_SITE_OTHER): Payer: Self-pay | Admitting: *Deleted

## 2009-01-13 ENCOUNTER — Encounter (INDEPENDENT_AMBULATORY_CARE_PROVIDER_SITE_OTHER): Payer: Self-pay | Admitting: *Deleted

## 2009-01-17 ENCOUNTER — Ambulatory Visit: Payer: Self-pay | Admitting: Internal Medicine

## 2009-01-17 DIAGNOSIS — E162 Hypoglycemia, unspecified: Secondary | ICD-10-CM

## 2009-01-17 LAB — CONVERTED CEMR LAB: Blood Glucose, Fingerstick: 100

## 2009-02-24 ENCOUNTER — Encounter (INDEPENDENT_AMBULATORY_CARE_PROVIDER_SITE_OTHER): Payer: Self-pay | Admitting: *Deleted

## 2009-03-08 ENCOUNTER — Telehealth (INDEPENDENT_AMBULATORY_CARE_PROVIDER_SITE_OTHER): Payer: Self-pay | Admitting: *Deleted

## 2009-03-20 ENCOUNTER — Encounter (INDEPENDENT_AMBULATORY_CARE_PROVIDER_SITE_OTHER): Payer: Self-pay | Admitting: *Deleted

## 2009-03-29 ENCOUNTER — Encounter (INDEPENDENT_AMBULATORY_CARE_PROVIDER_SITE_OTHER): Payer: Self-pay | Admitting: *Deleted

## 2009-03-29 ENCOUNTER — Ambulatory Visit: Payer: Self-pay | Admitting: Internal Medicine

## 2009-03-29 LAB — CONVERTED CEMR LAB
Blood Glucose, Fingerstick: 66
Hgb A1c MFr Bld: 4.9 %

## 2009-04-13 ENCOUNTER — Encounter (INDEPENDENT_AMBULATORY_CARE_PROVIDER_SITE_OTHER): Payer: Self-pay | Admitting: *Deleted

## 2009-04-14 ENCOUNTER — Encounter (INDEPENDENT_AMBULATORY_CARE_PROVIDER_SITE_OTHER): Payer: Self-pay | Admitting: *Deleted

## 2009-04-26 ENCOUNTER — Ambulatory Visit: Payer: Self-pay | Admitting: Internal Medicine

## 2009-04-28 ENCOUNTER — Ambulatory Visit (HOSPITAL_COMMUNITY): Admission: RE | Admit: 2009-04-28 | Discharge: 2009-04-28 | Payer: Self-pay | Admitting: Internal Medicine

## 2009-04-28 LAB — COMPREHENSIVE METABOLIC PANEL
ALT: 44 U/L (ref 0–53)
BUN: 11 mg/dL (ref 6–23)
CO2: 26 mEq/L (ref 19–32)
Calcium: 9.3 mg/dL (ref 8.4–10.5)
Chloride: 106 mEq/L (ref 96–112)
Creatinine, Ser: 1.03 mg/dL (ref 0.40–1.50)
Glucose, Bld: 184 mg/dL — ABNORMAL HIGH (ref 70–99)
Total Bilirubin: 1.1 mg/dL (ref 0.3–1.2)

## 2009-04-28 LAB — CBC WITH DIFFERENTIAL/PLATELET
BASO%: 0.4 % (ref 0.0–2.0)
Basophils Absolute: 0 10*3/uL (ref 0.0–0.1)
Eosinophils Absolute: 0.1 10*3/uL (ref 0.0–0.5)
HCT: 34.5 % — ABNORMAL LOW (ref 38.4–49.9)
HGB: 11.6 g/dL — ABNORMAL LOW (ref 13.0–17.1)
LYMPH%: 19 % (ref 14.0–49.0)
MONO#: 0.3 10*3/uL (ref 0.1–0.9)
NEUT%: 61 % (ref 39.0–75.0)
Platelets: 68 10*3/uL — ABNORMAL LOW (ref 140–400)
WBC: 2.3 10*3/uL — ABNORMAL LOW (ref 4.0–10.3)
lymph#: 0.4 10*3/uL — ABNORMAL LOW (ref 0.9–3.3)

## 2009-05-04 ENCOUNTER — Telehealth (INDEPENDENT_AMBULATORY_CARE_PROVIDER_SITE_OTHER): Payer: Self-pay | Admitting: *Deleted

## 2009-06-06 ENCOUNTER — Encounter (INDEPENDENT_AMBULATORY_CARE_PROVIDER_SITE_OTHER): Payer: Self-pay | Admitting: *Deleted

## 2009-06-23 ENCOUNTER — Telehealth (INDEPENDENT_AMBULATORY_CARE_PROVIDER_SITE_OTHER): Payer: Self-pay | Admitting: *Deleted

## 2009-06-27 ENCOUNTER — Encounter (INDEPENDENT_AMBULATORY_CARE_PROVIDER_SITE_OTHER): Payer: Self-pay | Admitting: *Deleted

## 2009-07-06 ENCOUNTER — Telehealth: Payer: Self-pay | Admitting: *Deleted

## 2009-07-11 ENCOUNTER — Ambulatory Visit: Payer: Self-pay | Admitting: Infectious Diseases

## 2009-07-11 ENCOUNTER — Encounter (INDEPENDENT_AMBULATORY_CARE_PROVIDER_SITE_OTHER): Payer: Self-pay | Admitting: Internal Medicine

## 2009-07-11 LAB — CONVERTED CEMR LAB
Blood Glucose, Fingerstick: 172
Hgb A1c MFr Bld: 5.1 %

## 2009-07-13 ENCOUNTER — Telehealth (INDEPENDENT_AMBULATORY_CARE_PROVIDER_SITE_OTHER): Payer: Self-pay | Admitting: Internal Medicine

## 2009-07-13 LAB — CONVERTED CEMR LAB
ALT: 35 units/L (ref 0–53)
AST: 50 units/L — ABNORMAL HIGH (ref 0–37)
Alkaline Phosphatase: 79 units/L (ref 39–117)
Creatinine, Ser: 1.09 mg/dL (ref 0.40–1.50)
HCT: 34.3 % — ABNORMAL LOW (ref 39.0–52.0)
MCHC: 32.1 g/dL (ref 30.0–36.0)
MCV: 96.9 fL (ref 78.0–100.0)
Platelets: 103 10*3/uL — ABNORMAL LOW (ref 150–400)
RDW: 14.3 % (ref 11.5–15.5)
Sodium: 140 meq/L (ref 135–145)
Total Bilirubin: 0.8 mg/dL (ref 0.3–1.2)
Total Protein: 7.3 g/dL (ref 6.0–8.3)
WBC: 3.1 10*3/uL — ABNORMAL LOW (ref 4.0–10.5)

## 2009-07-27 ENCOUNTER — Telehealth: Payer: Self-pay | Admitting: Internal Medicine

## 2009-08-15 ENCOUNTER — Ambulatory Visit: Payer: Self-pay | Admitting: Dentistry

## 2009-09-01 ENCOUNTER — Ambulatory Visit: Payer: Self-pay | Admitting: Internal Medicine

## 2009-09-01 DIAGNOSIS — K219 Gastro-esophageal reflux disease without esophagitis: Secondary | ICD-10-CM

## 2009-09-12 ENCOUNTER — Encounter: Payer: Self-pay | Admitting: Internal Medicine

## 2009-09-12 ENCOUNTER — Ambulatory Visit: Payer: Self-pay | Admitting: Internal Medicine

## 2009-09-12 ENCOUNTER — Ambulatory Visit (HOSPITAL_COMMUNITY): Admission: RE | Admit: 2009-09-12 | Discharge: 2009-09-12 | Payer: Self-pay | Admitting: Internal Medicine

## 2009-09-13 ENCOUNTER — Encounter: Payer: Self-pay | Admitting: Internal Medicine

## 2009-09-13 DIAGNOSIS — C184 Malignant neoplasm of transverse colon: Secondary | ICD-10-CM

## 2009-09-14 ENCOUNTER — Ambulatory Visit: Payer: Self-pay | Admitting: Internal Medicine

## 2009-09-15 ENCOUNTER — Ambulatory Visit (HOSPITAL_COMMUNITY): Admission: RE | Admit: 2009-09-15 | Discharge: 2009-09-15 | Payer: Self-pay | Admitting: Internal Medicine

## 2009-09-23 ENCOUNTER — Encounter: Payer: Self-pay | Admitting: Internal Medicine

## 2009-09-30 ENCOUNTER — Encounter: Payer: Self-pay | Admitting: Internal Medicine

## 2009-10-19 ENCOUNTER — Encounter: Payer: Self-pay | Admitting: Internal Medicine

## 2009-11-02 ENCOUNTER — Ambulatory Visit: Payer: Self-pay | Admitting: Internal Medicine

## 2009-11-02 LAB — CONVERTED CEMR LAB: Hgb A1c MFr Bld: 6 %

## 2009-11-17 ENCOUNTER — Encounter: Payer: Self-pay | Admitting: Internal Medicine

## 2009-11-21 ENCOUNTER — Ambulatory Visit: Admission: RE | Admit: 2009-11-21 | Discharge: 2009-12-14 | Payer: Self-pay | Admitting: Radiation Oncology

## 2009-11-28 ENCOUNTER — Telehealth (INDEPENDENT_AMBULATORY_CARE_PROVIDER_SITE_OTHER): Payer: Self-pay | Admitting: Internal Medicine

## 2009-12-14 ENCOUNTER — Encounter: Payer: Self-pay | Admitting: Internal Medicine

## 2009-12-21 ENCOUNTER — Encounter: Payer: Self-pay | Admitting: Internal Medicine

## 2010-01-04 ENCOUNTER — Inpatient Hospital Stay (HOSPITAL_COMMUNITY): Admission: EM | Admit: 2010-01-04 | Discharge: 2010-01-06 | Payer: Self-pay | Admitting: Emergency Medicine

## 2010-01-04 ENCOUNTER — Ambulatory Visit: Payer: Self-pay | Admitting: Internal Medicine

## 2010-01-04 ENCOUNTER — Encounter (INDEPENDENT_AMBULATORY_CARE_PROVIDER_SITE_OTHER): Payer: Self-pay | Admitting: Internal Medicine

## 2010-01-05 ENCOUNTER — Ambulatory Visit: Payer: Self-pay | Admitting: Internal Medicine

## 2010-01-05 ENCOUNTER — Encounter: Payer: Self-pay | Admitting: *Deleted

## 2010-01-05 ENCOUNTER — Encounter: Payer: Self-pay | Admitting: Internal Medicine

## 2010-01-05 LAB — CONVERTED CEMR LAB
Cholesterol: 116 mg/dL
Hgb A1c MFr Bld: 5.5 %
Triglycerides: 66 mg/dL

## 2010-01-06 ENCOUNTER — Encounter (INDEPENDENT_AMBULATORY_CARE_PROVIDER_SITE_OTHER): Payer: Self-pay | Admitting: Internal Medicine

## 2010-01-18 ENCOUNTER — Encounter (INDEPENDENT_AMBULATORY_CARE_PROVIDER_SITE_OTHER): Payer: Self-pay | Admitting: Internal Medicine

## 2010-01-23 ENCOUNTER — Ambulatory Visit: Payer: Self-pay | Admitting: Internal Medicine

## 2010-01-23 ENCOUNTER — Encounter (INDEPENDENT_AMBULATORY_CARE_PROVIDER_SITE_OTHER): Payer: Self-pay | Admitting: Internal Medicine

## 2010-01-23 ENCOUNTER — Encounter: Payer: Self-pay | Admitting: *Deleted

## 2010-01-23 DIAGNOSIS — E1165 Type 2 diabetes mellitus with hyperglycemia: Secondary | ICD-10-CM

## 2010-01-23 DIAGNOSIS — E1121 Type 2 diabetes mellitus with diabetic nephropathy: Secondary | ICD-10-CM

## 2010-01-23 LAB — CONVERTED CEMR LAB
Blood Glucose, Fingerstick: 144
Creatinine, Urine: 180.9 mg/dL
HCT: 32.6 % — ABNORMAL LOW (ref 39.0–52.0)
Hemoglobin: 11 g/dL — ABNORMAL LOW (ref 13.0–17.0)
MCHC: 33.8 g/dL (ref 30.0–36.0)
MCV: 91.2 fL (ref >=78.0)
RBC: 3.58 M/uL — ABNORMAL LOW (ref 4.22–5.81)

## 2010-03-02 ENCOUNTER — Telehealth (INDEPENDENT_AMBULATORY_CARE_PROVIDER_SITE_OTHER): Payer: Self-pay | Admitting: Internal Medicine

## 2010-03-23 ENCOUNTER — Telehealth (INDEPENDENT_AMBULATORY_CARE_PROVIDER_SITE_OTHER): Payer: Self-pay | Admitting: Internal Medicine

## 2010-03-28 ENCOUNTER — Telehealth (INDEPENDENT_AMBULATORY_CARE_PROVIDER_SITE_OTHER): Payer: Self-pay | Admitting: Internal Medicine

## 2010-05-01 ENCOUNTER — Ambulatory Visit: Payer: Self-pay | Admitting: Internal Medicine

## 2010-05-01 DIAGNOSIS — R1033 Periumbilical pain: Secondary | ICD-10-CM | POA: Insufficient documentation

## 2010-05-01 LAB — CONVERTED CEMR LAB: Blood Glucose, Fingerstick: 169

## 2010-07-07 ENCOUNTER — Encounter: Payer: Self-pay | Admitting: Ophthalmology

## 2010-07-10 ENCOUNTER — Telehealth: Payer: Self-pay | Admitting: *Deleted

## 2010-08-02 ENCOUNTER — Ambulatory Visit: Payer: Self-pay | Admitting: Internal Medicine

## 2010-08-02 LAB — CONVERTED CEMR LAB
BUN: 13 mg/dL (ref 6–23)
Creatinine, Ser: 1.1 mg/dL (ref 0.4–1.5)

## 2010-08-04 ENCOUNTER — Encounter: Payer: Self-pay | Admitting: Internal Medicine

## 2010-08-07 ENCOUNTER — Ambulatory Visit: Payer: Self-pay | Admitting: Cardiovascular Disease

## 2010-08-09 ENCOUNTER — Ambulatory Visit: Payer: Self-pay | Admitting: Internal Medicine

## 2010-08-09 DIAGNOSIS — I10 Essential (primary) hypertension: Secondary | ICD-10-CM | POA: Insufficient documentation

## 2010-08-10 ENCOUNTER — Ambulatory Visit: Payer: Self-pay | Admitting: Internal Medicine

## 2010-08-17 ENCOUNTER — Encounter: Payer: Self-pay | Admitting: Ophthalmology

## 2010-08-17 ENCOUNTER — Encounter: Payer: Self-pay | Admitting: Internal Medicine

## 2010-08-17 LAB — CBC WITH DIFFERENTIAL/PLATELET
BASO%: 1.2 % (ref 0.0–2.0)
EOS%: 9 % — ABNORMAL HIGH (ref 0.0–7.0)
HCT: 37.3 % — ABNORMAL LOW (ref 38.4–49.9)
LYMPH%: 23.7 % (ref 14.0–49.0)
MCH: 33 pg (ref 27.2–33.4)
MCHC: 33.7 g/dL (ref 32.0–36.0)
MCV: 97.8 fL (ref 79.3–98.0)
MONO#: 0.4 10*3/uL (ref 0.1–0.9)
MONO%: 14.2 % — ABNORMAL HIGH (ref 0.0–14.0)
NEUT%: 51.9 % (ref 39.0–75.0)
Platelets: 71 10*3/uL — ABNORMAL LOW (ref 140–400)

## 2010-08-17 LAB — COMPREHENSIVE METABOLIC PANEL
ALT: 42 U/L (ref 0–53)
CO2: 24 mEq/L (ref 19–32)
Creatinine, Ser: 1.17 mg/dL (ref 0.40–1.50)
Total Bilirubin: 0.8 mg/dL (ref 0.3–1.2)

## 2010-08-17 LAB — LACTATE DEHYDROGENASE: LDH: 196 U/L (ref 94–250)

## 2010-08-21 ENCOUNTER — Ambulatory Visit: Payer: Self-pay | Admitting: Dentistry

## 2010-08-22 ENCOUNTER — Encounter: Payer: Self-pay | Admitting: Internal Medicine

## 2010-08-22 ENCOUNTER — Ambulatory Visit: Payer: Self-pay | Admitting: Internal Medicine

## 2010-08-22 LAB — CONVERTED CEMR LAB
Blood Glucose, Fingerstick: 162
Hgb A1c MFr Bld: 6.9 %

## 2010-08-30 ENCOUNTER — Ambulatory Visit: Payer: Self-pay | Admitting: Internal Medicine

## 2010-08-31 ENCOUNTER — Ambulatory Visit: Payer: Self-pay | Admitting: Internal Medicine

## 2010-09-15 ENCOUNTER — Telehealth: Payer: Self-pay | Admitting: Internal Medicine

## 2010-09-18 ENCOUNTER — Encounter: Payer: Self-pay | Admitting: Internal Medicine

## 2010-09-18 ENCOUNTER — Telehealth: Payer: Self-pay | Admitting: Internal Medicine

## 2010-10-05 ENCOUNTER — Telehealth: Payer: Self-pay | Admitting: Internal Medicine

## 2010-10-06 ENCOUNTER — Ambulatory Visit: Payer: Self-pay | Admitting: Internal Medicine

## 2010-10-06 DIAGNOSIS — K439 Ventral hernia without obstruction or gangrene: Secondary | ICD-10-CM | POA: Insufficient documentation

## 2010-10-06 LAB — CONVERTED CEMR LAB
AST: 52 units/L — ABNORMAL HIGH (ref 0–37)
BUN: 16 mg/dL (ref 6–23)
Calcium: 9.5 mg/dL (ref 8.4–10.5)
Chloride: 103 meq/L (ref 96–112)
Creatinine, Ser: 1.13 mg/dL (ref 0.40–1.50)
HCT: 39.7 % (ref 39.0–52.0)
Hemoglobin: 12.9 g/dL — ABNORMAL LOW (ref 13.0–17.0)
MCV: 95.4 fL (ref >=78.0)
RDW: 14.3 % (ref 11.5–15.5)
Total Bilirubin: 0.9 mg/dL (ref 0.3–1.2)

## 2010-10-12 ENCOUNTER — Telehealth (INDEPENDENT_AMBULATORY_CARE_PROVIDER_SITE_OTHER): Payer: Self-pay | Admitting: *Deleted

## 2010-10-16 ENCOUNTER — Telehealth: Payer: Self-pay | Admitting: Internal Medicine

## 2010-11-01 ENCOUNTER — Telehealth: Payer: Self-pay | Admitting: Internal Medicine

## 2010-11-02 ENCOUNTER — Ambulatory Visit: Payer: Self-pay | Admitting: Internal Medicine

## 2011-01-19 ENCOUNTER — Other Ambulatory Visit: Payer: Self-pay | Admitting: Internal Medicine

## 2011-01-19 DIAGNOSIS — C61 Malignant neoplasm of prostate: Secondary | ICD-10-CM

## 2011-02-01 NOTE — Assessment & Plan Note (Signed)
Summary: PNEUM VAC/MHH  Nurse Visit   Allergies: No Known Drug Allergies  Immunizations Administered:  Pneumonia Vaccine:    Vaccine Type: Pneumovax    Site: left deltoid    Mfr: Merck    Dose: 0.5 ml    Route: IM    Given by: Clarise Cruz (AAMA)    Exp. Date: 12/15/2010    Lot #: [id number]  Orders Added: 1)  State-Pneumococcal Vaccine [90732S] 2)  Admin 1st Vaccine [60454]

## 2011-02-01 NOTE — Medication Information (Signed)
Summary: [facility]: RX History  [facility]: RX History   Imported By: Florinda Marker 01/26/2010 12:02:44  _____________________________________________________________________  External Attachment:    Type:   Image     Comment:   External Document

## 2011-02-01 NOTE — Progress Notes (Signed)
Summary: prior authorization-Omeprazole/gp  Phone Note Outgoing Call   Summary of Call: Received fax from [facility] pharmacy stating the pt. has met  the allowed quantity for Omeprazole 40mg  which is #90/365 days. I called [employer] [phone]) and received approval for Omeprazole 40mg  daily x 1 yr; from  07/10/2010 to 07/112012.  [facility] made awared. Initial call taken by: Chinita Pester RN,  July 10, 2010 10:45 AM

## 2011-02-01 NOTE — Procedures (Signed)
Summary: Upper Endoscopy  Patient: [name] Note: All result statuses are Final unless otherwise noted.  Tests: (1) Upper Endoscopy (EGD)   EGD Upper Endoscopy       DONE     [facility]     [address]     [place], [place]  [address]           ENDOSCOPY PROCEDURE REPORT           PATIENT:  [name], Eddie  MR#:  [id number]     BIRTHDATE:  09/21/1941, 68 yrs. old  GENDER:  male           ENDOSCOPIST:  Iva Boop, MD, [facility]           PROCEDURE DATE:  01/05/2010     PROCEDURE:  EGD with lesion ablation, EGD with snare polypectomy,     EGD with Submucosal Injection     ASA CLASS:  Class III     INDICATIONS:  hematemesis           MEDICATIONS:   Fentanyl 60 mcg IV, Versed mg IV     TOPICAL ANESTHETIC:  Cetacaine Spray           DESCRIPTION OF PROCEDURE:   After the risks benefits and     alternatives of the procedure were thoroughly explained, informed     consent was obtained.  The EG-[id number] ([id number]) endoscope was     introduced through the mouth and advanced to the second portion of     the duodenum, without limitations.  The instrument was slowly     withdrawn as the mucosa was fully examined.     <<PROCEDUREIMAGES>>           Multiple erosions were found in the distal esophagus.large (1-2 cm     long), from  30-36 cm.  A hiatal hernia was found. It was 4 cm in     size. 36-40 cm  A sessile polyp was found in the antrum. It was 1     cm in size.After  epinephrine injection the polyp was snared, then     cauterized with monopolar cautery. Polyp was retrieved with Lucina Mellow     net and sent to pathology. two clips applied at base to reduce     risk of bleeding afterwards.  AVM in the antrum. It was 2 mm in     size. Argon plasma coagulation ablation was performed to ablate     it.  Otherwise the examination was normal.    Retroflexed views     revealed a hiatal hernia.    The scope was then withdrawn from the     patient and the procedure  completed.           COMPLICATIONS:  None           ENDOSCOPIC IMPRESSION:     1) Erosions, multiple in the distal esophagus - GERD from 30-36     cm     2) 4 cm hiatal hernia     3) 1 cm sessile polyp in the antrum     4) 2 mm AVM in the antrum     5) Otherwise normal examination - no stignata of portal     hypertension seen     RECOMMENDATIONS:     bid PPI (was not on prior to admit and has known recurrent GERD           I will follow-up  pathology and coordinate GI follow-up after dc           if ok tomorrow could go home           REPEAT EXAM:: given cirrhosis, consider repeat EGD in 1 year to     screen for varices (he has well-compensated disease). Will defer     to Dr. Marina Goodell.           Iva Boop, MD, [name]           CC:  Yancey Flemings, MD           n.     Rosalie Doctor:   Iva Boop at 01/05/2010 05:59 PM           Marquita Palms, [id number]  Note: An exclamation mark (!) indicates a result that was not dispersed into the flowsheet. Document Creation Date: 01/05/2010 6:01 PM _______________________________________________________________________  (1) Order result status: Final Collection or observation date-time: 01/05/2010 17:43 Requested date-time:  Receipt date-time:  Reported date-time:  Referring Physician:   Ordering Physician: Stan Head [id number]) Specimen Source:  Source: Launa Grill Order Number: [id number] Lab site:

## 2011-02-01 NOTE — Progress Notes (Signed)
Summary: Colonoscopy questions  Phone Note Other Incoming Call back at [phone]   Caller: Marquita Palms sister-Thomasena Moat-Woods Reason for Call: Confirm/change Appt Summary of Call: Mr. Copeman Procedure was changed to 11/02/10.  His instructions were mailed to his home address and his sister states she will go over with patient to be sure he understands.  She was concerned about pt. having a Ventral Hernia and wants to know if it was still ok for patient to have colonoscopy.  I stated I would ask Dr. Marina Goodell to confirm it is ok and call her back.   Initial call taken by: Milford Cage [place],  October 16, 2010 2:45 PM  Follow-up for Phone Call        yes it is fine. make sure that he takes only 1/2 his pm lantus insulin the night before. make sure no ther meds for DM since his August appt Follow-up by: Hilarie Fredrickson MD,  October 16, 2010 8:39 PM  Additional Follow-up for Phone Call Additional follow up Details #1::        Called patient's sister and left message on voicemail that it is ok to continue with the colonoscopy as scheduled.  Asked her to call me if she has any questions regarding the procedure.   Additional Follow-up by: Milford Cage NCMA,  October 17, 2010 8:04 AM

## 2011-02-01 NOTE — Letter (Signed)
Summary: Diabetic Instructions  [facility]  [address] [place], [place] [address]   Phone: [phone]  Fax: [phone]    SHI BLANKENSHIP November 07, 1941 MRN: [id number]   _  _   ORAL DIABETIC MEDICATION INSTRUCTIONS  The day before your procedure:   Take your diabetic pill as you do normally  The day of your procedure:   Do not take your diabetic pill    We will check your blood sugar levels during the admission process and again in Recovery before discharging you home  ________________________________________________________________________  X  INSULIN (LONG ACTING) MEDICATION INSTRUCTIONS (Lantus, NPH, 70/30, Humulin, Novolin-N)   The day before your procedure:   Take  your regular evening dose    The day of your procedure:   Do not take your morning dose    _  _   INSULIN (SHORT ACTING) MEDICATION INSTRUCTIONS (Regular, Humulog, Novolog)   The day before your procedure:   Do not take your evening dose   The day of your procedure:   Do not take your morning dose   _  _   INSULIN PUMP MEDICATION INSTRUCTIONS  We will contact the physician managing your diabetic care for written dosage instructions for the day before your procedure and the day of your procedure.  Once we have received the instructions, we will contact you.

## 2011-02-01 NOTE — Progress Notes (Signed)
Summary: resend rx  Phone Note Call from Patient Call back at Home Phone [phone]   Caller: Patient Call For: Dr Marina Goodell Reason for Call: Talk to Nurse Summary of Call: Patient states that the movi prep is not in his pharmacy [facility] and he is scheduled for his procedure Monday. Initial call taken by: Tawni Levy,  September 15, 2010 3:08 PM    New/Updated Medications: MOVIPREP 100 GM  SOLR (PEG-KCL-NACL-NASULF-NA ASC-C) As per prep instructions. Prescriptions: MOVIPREP 100 GM  SOLR (PEG-KCL-NACL-NASULF-NA ASC-C) As per prep instructions.  #1 x 0   Entered by:   Milford Cage NCMA   Authorized by:   Hilarie Fredrickson MD   Signed by:   Milford Cage NCMA on 09/15/2010   Method used:   Electronically to        [facility] (retail)       [address]       [place], [place]  [id number]       Ph: [phone]       Fax: [phone]   RxID:   [id number]

## 2011-02-01 NOTE — Medication Information (Signed)
Summary: [facility]: Diabetic Testing Supplies  [facility]: Diabetic Testing Supplies   Imported By: Florinda Marker 01/24/2010 14:33:26  _____________________________________________________________________  External Attachment:    Type:   Image     Comment:   External Document

## 2011-02-01 NOTE — Letter (Signed)
Summary: [facility] Instructions  [facility]  [address] [place], [place] [address]   Phone: [phone]  Fax: [phone]       Eddie [name]    Apr 17, 1941    MRN: [id number]        Procedure Day /Date:MONDAY, 09/18/10     Arrival Time:10:00 AM     Procedure Time:11:00 AM     Location of Procedure:                    X  [facility] (4th Floor)                        PREPARATION FOR COLONOSCOPY WITH MOVIPREP   Starting 5 days prior to your procedure 9/14/11do not eat nuts, seeds, popcorn, corn, beans, peas,  salads, or any raw vegetables.  Do not take any fiber supplements (e.g. Metamucil, Citrucel, and Benefiber).  THE DAY BEFORE YOUR PROCEDURE         DATE: 09/17/10  DAY: SUNDAY  1.  Drink clear liquids the entire day-NO SOLID FOOD  2.  Do not drink anything colored red or purple.  Avoid juices with pulp.  No orange juice.  3.  Drink at least 64 oz. (8 glasses) of fluid/clear liquids during the day to prevent dehydration and help the prep work efficiently.  CLEAR LIQUIDS INCLUDE: Water Jello Ice Popsicles Tea (sugar ok, no milk/cream) Powdered fruit flavored drinks Coffee (sugar ok, no milk/cream) Gatorade Juice: apple, white grape, white cranberry  Lemonade Clear bullion, consomm, broth Carbonated beverages (any kind) Strained chicken noodle soup Hard Candy                             4.  In the morning, mix first dose of MoviPrep solution:    Empty 1 Pouch A and 1 Pouch B into the disposable container    Add lukewarm drinking water to the top line of the container. Mix to dissolve    Refrigerate (mixed solution should be used within 24 hrs)  5.  Begin drinking the prep at 5:00 p.m. The MoviPrep container is divided by 4 marks.   Every 15 minutes drink the solution down to the next mark (approximately 8 oz) until the full liter is complete.   6.  Follow completed prep with 16 oz of clear liquid of your choice (Nothing red  or purple).  Continue to drink clear liquids until bedtime.  7.  Before going to bed, mix second dose of MoviPrep solution:    Empty 1 Pouch A and 1 Pouch B into the disposable container    Add lukewarm drinking water to the top line of the container. Mix to dissolve    Refrigerate  THE DAY OF YOUR PROCEDURE      DATE: 09/18/10 DAY: MONDAY Beginning at 6:00 a.m. (5 hours before procedure):         1. Every 15 minutes, drink the solution down to the next mark (approx 8 oz) until the full liter is complete.  2. Follow completed prep with 16 oz. of clear liquid of your choice.    3. You may drink clear liquids until 9:00 AM (2 HOURS BEFORE PROCEDURE).   MEDICATION INSTRUCTIONS  Unless otherwise instructed, you should take regular prescription medications with a small sip of water   as early as possible the morning of your procedure.  Diabetic patients -  see separate instructions.         OTHER INSTRUCTIONS  You will need a responsible adult at least 68 years of age to accompany you and drive you home.   This person must remain in the waiting room during your procedure.  Wear loose fitting clothing that is easily removed.  Leave jewelry and other valuables at home.  However, you may wish to bring a book to read or  an iPod/MP3 player to listen to music as you wait for your procedure to start.  Remove all body piercing jewelry and leave at home.  Total time from sign-in until discharge is approximately 2-3 hours.  You should go home directly after your procedure and rest.  You can resume normal activities the  day after your procedure.  The day of your procedure you should not:   Drive   Make legal decisions   Operate machinery   Drink alcohol   Return to work  You will receive specific instructions about eating, activities and medications before you leave.    The above instructions have been reviewed and explained to me by   _______________________    I  fully understand and can verbalize these instructions _____________________________ Date _________

## 2011-02-01 NOTE — Assessment & Plan Note (Signed)
Summary: twinrix #2/bs  Nurse Visit   Allergies: No Known Drug Allergies  Immunizations Administered:  TwinRix # 2:    Vaccine Type: TwinRix    Site: left deltoid    Mfr: GlaxoSmithKline    Dose: 1.0 ml    Route: IM    Given by: Merri Ray CMA (AAMA)    Exp. Date: 10/06/2012    Lot #: [id number]    VIS given: 09/18/07 version given August 09, 2010.  Orders Added: 1)  TwinRix 1ml ( Hep A&B Adult dose) [90636] 2)  Admin 1st Vaccine [90471]

## 2011-02-01 NOTE — Letter (Signed)
Summary[facility]  [facility]   Imported By: Margie Billet 08/31/2010 15:03:07  _____________________________________________________________________  External Attachment:    Type:   Image     Comment:   External Document

## 2011-02-01 NOTE — Assessment & Plan Note (Signed)
Summary: EST-CK/FU/MEDS/CFB   Vital Signs:  Patient profile:   70 year old male Height:      72 inches Weight:      174.6 pounds BMI:     23.77 Temp:     97.7 degrees F oral Pulse rate:   69 / minute BP sitting:   110 / 66  Vitals Entered By: Filomena Jungling NT II (May 01, 2010 8:39 AM) CC: checkup/  need to talk about his little stomach pain doe not hurt Is Patient Diabetic? Yes Nutritional Status BMI of 19 -24 = normal CBG Result 169  Have you ever been in a relationship where you felt threatened, hurt or afraid?No   Does patient need assistance? Functional Status Self care Ambulation Normal   Primary Care Provider:  Elby Showers, MD  CC:  checkup/  need to talk about his little stomach pain doe not hurt.  History of Present Illness: 69yom with pmh as outlined in chart here for diabetes check up.  He reports that he has been feeling  well.  He does have a little needling pain in the left abdomen and wonders if he could have some cancer in his stomach.  No no n/v/d/constipation.  Never any hematochezia, occasional dark black stools.  No night sweats, engergy level is unchanged.  no pain anywhere else.  Pain is not realy bad,  just occasional, not assciated with food or bowel movement.  Preventive Screening-Counseling & Management  Alcohol-Tobacco     Alcohol drinks/day: 0     Alcohol type: beer,wine,liquor     Smoking Status: never  Caffeine-Diet-Exercise     Does Patient Exercise: no  Current Medications (verified): 1)  Lantus 100 Unit/ml  Soln (Insulin Glargine) .... Take 20 Units of Lantus Each Night 2)  Omeprazole 40 Mg Cpdr (Omeprazole) .... Take One Tablet Daily For Reflux. 3)  Monoject Insulin Safety Syr 29g X 1/2" 1 Ml Misc (Insulin Syringe-Needle U-100) .... Use As Directed. 4)  Folic Acid 1 Mg  Tabs (Folic Acid) .... Take 1 Tablet By Mouth Once A Day 5)  Lancets   Misc (Lancets) .... Use To Test Blood Glucose 3x Daily 6)  Accu-Chek Aviva  Strp (Glucose Blood)  .... To Test Blood Glucose 3x  Daily 7)  Propranolol Hcl 10 Mg Tabs (Propranolol Hcl) .... Take 1 Tablet By Mouth Three Times A Day  Allergies (verified): No Known Drug Allergies  Past History:  Past Medical History:   Hospitalized for hematemesis in Jan. 2011 - negative EGD. On two times a day PPI now.    History of thrombocytopenia.   Diabetes.  Hypertension.   History of tonsillar cancer.   History of colon cancer status post left hemicolectomy at [facility] in November 2010.   History of cirrhosis, diagnosed in 2009.   Anemia, baseline hemoglobin 11.   History of gastroparesis.  History of hepatitis C.  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  Cholelithiasis with chronic thickening of gallbladder.  History of alcoholism.  History of inguinal hernia repair.  History of tuberculosis, treated for 6 months back in 1997.  History of thrush.  History of esophageal stricture.   history of hiatal hernia.   Review of Systems       per hpi  Physical Exam  General:  alert and underweight appearing.   Head:  normocephalic and atraumatic.   Eyes:  vision grossly intact, pupils equal, pupils round, and pupils reactive to light.   Mouth:  pharynx pink and moist.  Lungs:  normal respiratory effort and normal breath sounds.   Heart:  normal rate, regular rhythm, and no murmur.   Abdomen:  soft, non-tender, normal bowel sounds, no distention, no masses, no guarding, no rigidity, no hepatomegaly, and no splenomegaly.   Pulses:  2+ Extremities:  no edema Neurologic:  alert & oriented X3, cranial nerves II-XII intact, strength normal in all extremities, and gait normal.   Cervical Nodes:  no anterior cervical adenopathy and no posterior cervical adenopathy.   Psych:  Oriented X3, memory intact for recent and remote, good eye contact, and not anxious appearing.     Impression & Recommendations:  Problem # 1:  ABDOMINAL PAIN, PERIUMBILIC (ICD-789.05) New periumbelical pain, to the  left of the umbelicus.  Concerning with hx of colon cancer, but discription is not as worrysome (occasional, fleeting sharp pains).  May be gas.  No other gi symptoms, no night sweats or weight loss (has acutally gained).  Will get records from [facility] to see where he is in  treatment/followup for colon cancer. Have reviewed the gi note from 1/11 regarding his surgery by GI and seems that the plan is for a CT in the near future.  I'd like to be sure that he has a fu apt with them in the near future.  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  No changes to day.  Discussed signs of hypoglycemia, he denied having any events.  Asked him to check his cbg's once in a while and if he notices that they get <70 a lot he could consider titrating down on the lantus.   His updated medication list for this problem includes:    Lantus 100 Unit/ml Soln (Insulin glargine) .Marland Kitchen... Take 20 units of lantus each night  Orders: T- Capillary Blood Glucose (30865) T-Hgb A1C (in-house) (78469GE)  Labs Reviewed: Creat: 1.1 (09/14/2009)    Reviewed HgBA1c results: 6.1 (05/01/2010)  5.5 (01/05/2010)  Problem # 3:  GERD (ICD-530.81) Assessment: Unchanged Reports that symptoms are controled with omeprazole.  His updated medication list for this problem includes:    Omeprazole 40 Mg Cpdr (Omeprazole) .Marland Kitchen... Take one tablet daily for reflux.  Complete Medication List: 1)  Lantus 100 Unit/ml Soln (Insulin glargine) .... Take 20 units of lantus each night 2)  Omeprazole 40 Mg Cpdr (Omeprazole) .... Take one tablet daily for reflux. 3)  Monoject Insulin Safety Syr 29g X 1/2" 1 Ml Misc (Insulin syringe-needle u-100) .... Use as directed. 4)  Folic Acid 1 Mg Tabs (Folic acid) .... Take 1 tablet by mouth once a day 5)  Lancets Misc (Lancets) .... Use to test blood glucose 3x daily 6)  Accu-chek Aviva Strp (Glucose blood) .... To test blood glucose 3x  daily 7)  Propranolol Hcl 10  Mg Tabs (Propranolol hcl) .... Take 1 tablet by mouth three times a day  Patient Instructions: 1)  We will get your records from [facility] to see if more testing is needed for your stomach pain. 2)  Your diabetes is very well controled.  If you are having low blood sugars, you could decrease your lantus dose to 15 units daily. 3)  Please schedule a follow-up appointment in 3 months.    Prevention & Chronic Care Immunizations   Influenza vaccine: Fluvax Non-MCR  (10/23/2007)   Influenza vaccine deferral: Not indicated  (01/23/2010)    Tetanus booster: Not documented   Td booster deferral: Not indicated  (11/02/2009)  Pneumococcal vaccine: Not documented   Pneumococcal vaccine deferral: Not indicated  (01/23/2010)    H. zoster vaccine: Not documented   H. zoster vaccine deferral: Not indicated  (11/02/2009)  Colorectal Screening   Hemoccult: Not documented   Hemoccult action/deferral: Not indicated  (11/02/2009)    Colonoscopy: Location:  [facility].    (09/12/2009)  Other Screening   PSA: Not documented   PSA action/deferral: Not indicated  (11/02/2009)   Smoking status: never  (05/01/2010)  Diabetes Mellitus   HgbA1C: 6.1  (05/01/2010)    Eye exam: Not documented   Diabetic eye exam action/deferral: Not indicated  (01/23/2010)   Eye exam due: 03/31/2010    Foot exam: yes  (11/02/2009)   High risk foot: Not documented   Foot care education: Not documented   Foot exam due: 11/02/2010    Urine microalbumin/creatinine ratio: 11.4  (01/23/2010)   Urine microalbumin action/deferral: Ordered    Diabetes flowsheet reviewed?: Yes   Progress toward A1C goal: Unchanged  Lipids   Total Cholesterol: 116  (01/05/2010)   LDL: 60  (01/05/2010)   LDL Direct: Not documented   HDL: 43  (01/05/2010)   Triglycerides: 66  (01/05/2010)   Lipid panel due: 02/06/2009  Self-Management Support :   Personal Goals (by the next clinic visit) :     Personal A1C goal: 7   (01/23/2010)     Personal blood pressure goal: 130/80  (01/23/2010)     Personal LDL goal: 100  (01/23/2010)    Patient will work on the following items until the next clinic visit to reach self-care goals:     Medications and monitoring: take my medicines every day, check my blood sugar, examine my feet every day  (05/01/2010)     Eating: drink diet soda or water instead of juice or soda, eat fruit for snacks and desserts  (05/01/2010)     Other: trying to gain weight so eats "what I can get or feel like eating - do avoid sweets" - does not want to start any exercise - does walk up and down 3 flights of stairs where he lives - did not bring meter but willing to do so next visit  (01/23/2010)    Diabetes self-management support: Education handout, Resources for patients handout  (05/01/2010)   Diabetes education handout printed      Resource handout printed.  Laboratory Results   Blood Tests   Date/Time Received: May 01, 2010 9:07 AM Date/Time Reported: Alric Quan  May 01, 2010 9:08 AM   HGBA1C: 6.1%   (Normal Range: Non-Diabetic - 3-6%   Control Diabetic - 6-8%) CBG Random:: 169mg /dL

## 2011-02-01 NOTE — Miscellaneous (Signed)
Summary: Hospital D/C: Hematemesis  Hospital Discharge  Date of admission: 01/04/2010  Date of discharge: 01/06/2010  Brief reason for admission/active problems: Was admitted with history of hematemesis 1 week prior to presentation and then recurrence of the hematemesis the day he presented to the ED. He was transfused 2 units intially and responded appropriately. He had no further episodes during hospitalization and was on PPI therapy two times a day. [facility] performed an EGD and recommended outpatient PPI twice daily.  Followup needed: 1. Please ask if patient has had any more episodes of hematemesis since d/c and if tolerating PPI two times a day. Please check repeat CBC to evaluate Hgb. 2. Please ask about appetite and intake by mouth. 3. He missed an appointment with Jamison Neighbor in December, please have him set another appointment.  The medication and problem lists have been updated.  Please see the dictated discharge summary for details.  Medications: Changed medication from OMEPRAZOLE 20 MG CPDR (OMEPRAZOLE) Take 1 tablet by mouth once a day to PROTONIX 40 MG PACK (PANTOPRAZOLE SODIUM) Take 1 tablet by mouth two times a day - Signed Removed medication of MAGNESIUM OXIDE 400 MG  CAPS (MAGNESIUM OXIDE) Take 1 tablet by mouth three times a day Removed medication of REGLAN 10 MG TABS (METOCLOPRAMIDE HCL) Take one tablet before a large meal. Removed medication of MULTIVITAMINS  TABS (MULTIPLE VITAMIN) Take 1 tablet by mouth once a day Removed medication of GLIPIZIDE 10 MG TABS (GLIPIZIDE) Take one tablet two times a day Added new medication of PROPRANOLOL HCL 10 MG TABS (PROPRANOLOL HCL) Take 1 tablet by mouth three times a day Added new medication of SENOKOT 8.6 MG TABS (SENNOSIDES) Take 1 tablet by mouth two times a day as needed. Changed medication from LANTUS 100 UNIT/ML  SOLN (INSULIN GLARGINE) Take 40 units of lantus each night to LANTUS 100 UNIT/ML  SOLN (INSULIN GLARGINE) Take  20 units of lantus each night Rx of PROTONIX 40 MG PACK (PANTOPRAZOLE SODIUM) Take 1 tablet by mouth two times a day;  #60 x 5;  Signed;  Entered by: Nilda Riggs MD;  Authorized by: Nilda Riggs MD;  Method used: Handwritten    Prescriptions: PROTONIX 40 MG PACK (PANTOPRAZOLE SODIUM) Take 1 tablet by mouth two times a day  #60 x 5   Entered and Authorized by:   Nilda Riggs MD   Signed by:   Nilda Riggs MD on 01/06/2010   Method used:   Handwritten   RxID:   [id number]    Patient Instructions: 1)  You have a follow-up appointment with Dr. Logan Bores on January 24th, 2011 at 2:00 pm. 2)  Please return to the hospital if your symptoms return. 3)  Please take your new medication, Protonix, as ordered. 4)  Continue to check your blood sugars and keep a diary of your dosing and blood sugar checks from now until your appointment on the 24th of January. 5)  Remember to bring all your medications with you to your appointment.

## 2011-02-01 NOTE — Progress Notes (Signed)
Summary: prep ?'s  Phone Note Call from Patient Call back at [facility] Phone [phone]   Caller: Patient Call For: Dr. Marina Goodell Reason for Call: Talk to Nurse Summary of Call: prep ?'s Initial call taken by: Vallarie Mare,  November 01, 2010 10:58 AM  Follow-up for Phone Call        Returned pts. phone call and answered questions reguarding prep. Follow-up by: Jennye Boroughs RN,  November 01, 2010 11:02 AM

## 2011-02-01 NOTE — Assessment & Plan Note (Signed)
Summary: Cirrhosis, Colon Cancer, GERD (establish)   History of Present Illness Visit Type: Follow-up Visit Primary GI MD: Yancey Flemings MD Primary Provider: Elby Showers, MD Requesting Provider: na Chief Complaint: Patient was sent back here from [facility] to follow up on his liver disease and Hepatitis. The letter states that it is hard for him to get to his appts at [facility] but the patient states that his sister takes him to all of his appts and he has not missed or no showed any of them. He denies any problems and says he feels good.  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 Not felt he would benefit from chemotherapy. Presents today to establish local care for his GI problems. Had been followed in [facility]. Not deemed a canidate for Hep C therapy. Note from liver clinic w/ labs and some general recommendations from clinic provided by patient.  He is due for follow up colonoscopy. No active GI c/o. Takes omeprazole for GERD and was on propranolol 10mg  three times a day. Nothing to suggest GI bleed,ascites, or encephalopathy. Recent labs w/ normal hg, bili, INR, Albumin. Plts 76 and AFP 13.7. Hep A and B reactive abs. He had and EGD at [facility] that was said to show gatric varices and esophagitis. Most recent EGD with Dr Leone Payor 12-2009 for hematemesis showed erosive esophagitis and H/H. No varices seen.   GI Review of Systems      Denies abdominal pain, acid reflux, belching, bloating, chest pain, dysphagia with liquids, dysphagia with solids, heartburn, loss of appetite, nausea, vomiting, vomiting blood, weight loss, and  weight gain.        Denies anal fissure, black tarry stools, change in bowel habit,  constipation, diarrhea, diverticulosis, fecal incontinence, heme positive stool, hemorrhoids, irritable bowel syndrome, jaundice, light color stool, liver problems, rectal bleeding, and  rectal pain.    Current Medications (verified): 1)  Lantus 100 Unit/ml  Soln (Insulin Glargine) .... Take 20 Units of Lantus Each Night 2)  Omeprazole 40 Mg Cpdr (Omeprazole) .... Take One Tablet Daily For Reflux. 3)  Monoject Insulin Safety Syr 29g X 1/2" 1 Ml Misc (Insulin Syringe-Needle U-100) .... Use As Directed. 4)  Folic Acid 800 Mcg Tabs (Folic Acid) .... Take One By Mouth Once Daily 5)  Lancets   Misc (Lancets) .... Use To Test Blood Glucose 3x Daily 6)  Accu-Chek Aviva  Strp (Glucose Blood) .... To Test Blood Glucose 3x  Daily 7)  Propranolol Hcl 10 Mg Tabs (Propranolol Hcl) .... Take 1 Tablet By Mouth Three Times A Day (Out For About A Week) 8)  Multivitamins  Tabs (Multiple Vitamin) .... Take One By Mouth Once Daily  Allergies (verified): No Known Drug Allergies  Past History:  Past Medical History: Reviewed history from 05/01/2010 and no changes required.   Hospitalized for hematemesis in Jan. 2011 - negative EGD. On two times a day PPI now.    History of thrombocytopenia.   Diabetes.  Hypertension.   History of tonsillar cancer.   History of colon cancer status post left hemicolectomy at [facility] in November 2010.   History of cirrhosis, diagnosed in 2009.   Anemia, baseline hemoglobin 11.   History of gastroparesis.  History of hepatitis C.  Chronic  pancreatitis secondary to alcohol abuse.  Cholelithiasis with chronic thickening of gallbladder.  History of alcoholism.  History of inguinal hernia repair.  History of tuberculosis, treated for 6 months back in 1997.  History of thrush.  History of esophageal stricture.   history of hiatal hernia.   Past Surgical History: Reviewed history from 01/23/2010 and no changes required. S/p hemicolectomy. S/p throat surgery for cancer. Hernia  Surgery  Family History: Mother has breast cancer and alzhemiers No FH of Colon Cancer:  Social History: Reviewed history from 01/23/2010 and no changes required. 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  Alcohol Use - none in 2 years Daily Caffeine Use Single, never married. No relationship currently.  Review of Systems  The patient denies allergy/sinus, anemia, anxiety-new, arthritis/joint pain, back pain, blood in urine, breast changes/lumps, change in vision, confusion, cough, coughing up blood, depression-new, fainting, fatigue, fever, headaches-new, hearing problems, heart murmur, heart rhythm changes, itching, menstrual pain, muscle pains/cramps, night sweats, nosebleeds, pregnancy symptoms, shortness of breath, skin rash, sleeping problems, sore throat, swelling of feet/legs, swollen lymph glands, thirst - excessive , urination - excessive , urination changes/pain, urine leakage, vision changes, and voice change.    Vital Signs:  Patient profile:   70 year old male Height:      72 inches Weight:      179.4 pounds BMI:     24.42 Pulse rate:   92 / minute Pulse rhythm:   regular BP sitting:   120 / 70  (left arm) Cuff size:   regular  Vitals Entered By: Harlow Mares CMA Duncan Dull) (August 02, 2010 9:29 AM)  Physical Exam  General:  Thin, chronically  ill appearring and older than stated age. no acute distress. Head:  Normocephalic and atraumatic. Eyes:  PERRLA, no icterus. Nose:  No deformity, discharge,  or lesions. Mouth:  No deformity or lesions Lungs:  Clear throughout to auscultation. Heart:  Regular rate and rhythm; no murmurs, rubs,  or bruits. Abdomen:  Soft, nontender and nondistended. No masses, hepatosplenomegaly or hernias noted. Normal bowel sounds. Msk:  Symmetrical with no gross deformities. Normal posture. Pulses:  Normal pulses noted. Extremities:  No clubbing, cyanosis, edema or  deformities noted. Neurologic:  Alert and  oriented x4;  grossly normal neurologically.No asterixis Skin:  Intact without significant lesions or rashes. Psych:  Alert and cooperative. Normal mood and affect.   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 Currently compensated with normal labs except thrombocytopenia. No hx esophageal varices as recent as 12-2009 and ? gastric varices prior to that.Marland KitchenMarland KitchenNo hx ascites or encephalopathy. Not drinking since 2008. Plans for monitoring and care as outlined in [facility] correspondance.  Plan: 1.Twinrix and pneumococcal vaccinations 2.Annual CT to screen for HCC 3.Probably should resume Propranolol 4.Continue no Etoh 5. low sodium diet 6.Relook EGD for variceal screening arounf 12-2010 7.Routine GI checkups every 6 months 8. 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 Due for follow up colonoscopy.  Plan 1. Colonoscopy. Discussed NP,R,B,A w/ patient 2. Movi prep prescribed 3. Establish w/ oncology in [place] for longitudinal follow up (also hx ENT and prostate Cancers)  Problem # 3:  GERD (ICD-530.81) Complicated by erosive esophagitis with GI bleed and stricture.  Problem #  4:  CHOLELITHIASIS (ICD-574.20) Assessment: Comment Only  Other Orders: Pneumococcal Vaccine (09811) Pneumococcal Vaccine (91478) TwinRix 1ml ( Hep A&B Adult dose) (29562) Admin 1st Vaccine (13086) Admin of Any Addtl Vaccine (57846) Admin 1st Vaccine (State) [phone]) Admin of Any Addtl Vaccine (State) (84132G) Colonoscopy (Colon) CT Abdomen (CT Abd) TLB-BUN (Urea Nitrogen) (84520-BUN) TLB-Creatinine, Blood (82565-CREA)  Patient Instructions: 1)  Twinrix Vacc. given today.  Next vaccine scheduled for 08/09/10 9:30 am. 2)  Pneumoccocal  Vaccination will be given to you today on second floor at Pulmonary. 3)  We will schedule your appt. with Oncology and call you with appt. time and date. 4)  CT with and w/o constrast scheduled at [facility] CT 08/07/10 10:30 am   you need to arrive at 10:15 am 5)  Contrast given to you with instructions. 6)  Colon [facility] 09/18/10 11:00 am arrive at 10:00 am 7)  Movi prep instructions given Movi prep Rx. sent to your pharmacy for you to pick up. 8)  Colonoscopy and Flexible Sigmoidoscopy brochure given.  9)  The medication list was reviewed and reconciled.  All changed / newly prescribed medications were explained.  A complete medication list was provided to the patient / caregiver. 10)  Copy: Dr. Elby Showers Prescriptions: MOVIPREP 100 GM  SOLR (PEG-KCL-NACL-NASULF-NA ASC-C) As per prep instructions.  #1 x 0   Entered by:   Milford Cage NCMA   Authorized by:   Hilarie Fredrickson MD   Signed by:   Milford Cage NCMA on 08/02/2010   Method used:   Electronically to        [facility] (retail)       [address]       [place], [place]  [id number]       Ph: [phone]       Fax: [phone]   RxID:   [id number]    TwinRix # 1    Vaccine Type: TwinRix    Site: left deltoid    Mfr: GlaxoSmithKline    Dose: 1.0 ml    Route: IM    Given by: Harlow Mares CMA (AAMA)    Lot #: [id number]    VIS given: Hep A 03/20/2005, Hep B 07/17/2006

## 2011-02-01 NOTE — Consult Note (Signed)
[facility]   Imported By: Lennie Odor 08/30/2010 14:54:28  _____________________________________________________________________  External Attachment:    Type:   Image     Comment:   External Document

## 2011-02-01 NOTE — Progress Notes (Signed)
Summary: appt, abd distention/ hla  Phone Note Call from Patient   Summary of Call: pt calls to request appt c/o abd distention x 2 mon, denies pain, states he has had cancer 3 times and his family is worried about him also says it is "lopsided". and it feels like there is a knot in it. appt given 10/7 Initial call taken by: Marin Roberts RN,  October 05, 2010 4:29 PM  Follow-up for Phone Call        agree. thanks Follow-up by: Bethel Born MD,  October 05, 2010 5:01 PM

## 2011-02-01 NOTE — Procedures (Signed)
Summary: Colonoscopy  Patient: Eddie [name] Note: All result statuses are Final unless otherwise noted.  Tests: (1) Colonoscopy (COL)   COL Colonoscopy           DONE     [facility]     [address]     [place], [place]  [address]           COLONOSCOPY PROCEDURE REPORT           PATIENT:  Eddie [name], Eddie [name]  MR#:  [id number]     BIRTHDATE:  1941-09-20, 69 yrs. old  GENDER:  male     ENDOSCOPIST:  Wilhemina Bonito. Eda Keys, MD     REF. BY:  Office     PROCEDURE DATE:  11/02/2010     PROCEDURE:  Surveillance Colonoscopy     ASA CLASS:  Class III     INDICATIONS:  history of colon cancer, surveillance and high-risk     screening ; dx 08-2009 and surgery 10-2009 (T3,N0,M0)     MEDICATIONS:   Fentanyl 50 mcg IV, Versed 5 mg IV           DESCRIPTION OF PROCEDURE:   After the risks benefits and     alternatives of the procedure were thoroughly explained, informed     consent was obtained.  Digital rectal exam was performed and     revealed no abnormalities.   The LB [id number] endoscope     was introduced through the anus and advanced to the cecum, which     was identified by both the appendix and ileocecal valve, without     limitations.Time to cecum = 1:36 min.  The quality of the prep was     excellent, using MoviPrep.  The instrument was then slowly     withdrawn (time = 7:25 min) as the colon was fully examined.     <<PROCEDUREIMAGES>>           FINDINGS:  There was a end-side surgical anastomosis in the     descending colon at 40cm.  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  Scattered diverticula were found as well.  No polyps or     cancers were seen.   Retroflexed views in the rectum revealed     moderate internal hemorrhoids.    The scope was then withdrawn     from the patient and the procedure completed.           COMPLICATIONS:  None     ENDOSCOPIC IMPRESSION:     1) Anastomosis in the descending colon     2) Melanosis throughout the colon  3) Diverticula, scattered     4) No polyps or cancers     5) Internal hemorrhoids           RECOMMENDATIONS:     1) Follow up colonoscopy in 2 years           ______________________________     Wilhemina Bonito. Eda Keys, MD           CC:  Elby Showers, MD;Mohamed Arbutus Ped, MD;The Patient           n.     Rosalie DoctorWilhemina Bonito. Eda Keys at 11/02/2010 11:06 AM           Marquita Palms, [id number]  Note: An exclamation mark (!) indicates a result that was not dispersed into the flowsheet. Document Creation Date: 11/02/2010 11:06 AM _______________________________________________________________________  (1)  Order result status: Final Collection or observation date-time: 11/02/2010 10:57 Requested date-time:  Receipt date-time:  Reported date-time:  Referring Physician:   Ordering Physician: Fransico Setters [phone]) Specimen Source:  Source: Launa Grill Order Number: [id number] Lab site:   Appended Document: Colonoscopy    Clinical Lists Changes  Observations: Added new observation of COLONNXTDUE: 10/2012 (11/02/2010 14:38)

## 2011-02-01 NOTE — Letter (Signed)
Summary: Diabetic Instructions  [facility]  [address] [place], [place] [address]   Phone: [phone]  Fax: [phone]    Eddie [name] November 25, 1941 MRN: [id number]     ________________________________________________________________________  X   INSULIN (LONG ACTING) MEDICATION INSTRUCTIONS (Lantus)   The day before your procedure:   Take  your regular evening dose    The day of your procedure:   Do not take your morning dose

## 2011-02-01 NOTE — Progress Notes (Signed)
Summary: Refill/gh  Phone Note Refill Request Message from:  Fax from Pharmacy on September 18, 2010 5:04 PM  Refills Requested: Medication #1:  Polyethylene Glycol SPN GM # [id number] Last labs and visit were 07/2010   Method Requested: Electronic Initial call taken by: Angelina Ok RN,  September 18, 2010 5:05 PM  Follow-up for Phone Call        denied -this is a one time only script for colonoscopy prep. Follow-up by: Julaine Fusi  DO,  September 18, 2010 5:10 PM

## 2011-02-01 NOTE — Assessment & Plan Note (Signed)
Summary: CHECKUP/SB.   Vital Signs:  Patient profile:   70 year old male Height:      72 inches (182.88 cm) Weight:      181.1 pounds (82.32 kg) BMI:     24.65 Pulse rate:   80 / minute BP sitting:   130 / 77  (left arm) Cuff size:   regular  Vitals Entered By: Theotis Barrio NT II (August 09, 2010 2:33 PM) CC: ROUTINE OFFICE VISIT WITH MEDICATION REFILL  /  NO CONCERNS NOR COMPLAINTS Is Patient Diabetic? Yes Did you bring your meter with you today? No Pain Assessment Patient in pain? no      Nutritional Status BMI of 19 -24 = normal  Have you ever been in a relationship where you felt threatened, hurt or afraid?No   Does patient need assistance? Functional Status Self care Ambulation Normal   Primary Care Provider:  Elby Showers, MD  CC:  ROUTINE OFFICE VISIT WITH MEDICATION REFILL  /  NO CONCERNS NOR COMPLAINTS.  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 The patients diabetes has been well controled in the past (last A1C- 6.1 5/11), he does not check CBGs at home but he is not missing any doses of his Lantus.  The patient does check his feet daily and had his anual dialated eye exam 7-8 months ago.  The patient has not had a recent foot exam.  As far as his GI tract goes, the patient denies any blood in stool or changes in his BM, however, 1 month ago he had a black stool and and some constipation which resolved with milk of magnesia. The patient denies any recent GI symptoms.  The patient also denies any Gerd symptoms, nausea or vomiting, or changes in his vision.  He does have some ocationional periumbilical tenderness unchanged since his surgery.  Lastly, the patient has been approched by a representitive of medicare regarding eligability for a new  glucose meter but is concerned about giveing away his personal info.          Preventive Screening-Counseling & Management  Alcohol-Tobacco     Alcohol drinks/day: 0     Alcohol type: beer,wine,liquor     Smoking Status: never  Caffeine-Diet-Exercise     Does Patient Exercise: no  Problems Prior to Update: 1)  Adenocarcinoma, Colon, Cecum, Hx of  (ICD-V10.05) 2)  Abdominal Pain, Periumbilic  (ICD-789.05) 3)  Malignant Neoplasm of Transverse Colon  (ICD-153.1) 4)  Gerd  (ICD-530.81) 5)  Special Screening For Malignant Neoplasms Colon  (ICD-V76.51) 6)  Hypoglycemia  (ICD-251.2) 7)  Adenocarcinoma, Prostate  (ICD-185) 8)  Thrush  (ICD-112.0) 9)  Hyponatremia  (ICD-276.1) 10)  Hyperkalemia  (ICD-276.7) 11)  Poisoning Unspecified Drug/medicinal Substance  (ICD-977.9) 12)  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 Take 20 Units of Lantus Each Night 2)  Omeprazole 40 Mg Cpdr (Omeprazole) .Marland KitchenMarland KitchenMarland Kitchen  Take One Tablet Daily For Reflux. 3)  Monoject Insulin Safety Syr 29g X 1/2" 1 Ml Misc (Insulin Syringe-Needle U-100) .... Use As Directed. 4)  Folic Acid 800 Mcg Tabs (Folic Acid) .... Take One By Mouth Once Daily 5)  Lancets   Misc (Lancets) .... Use To Test Blood Glucose 3x Daily 6)  Accu-Chek Aviva  Strp (Glucose Blood) .... To Test Blood Glucose 3x  Daily 7)  Propranolol Hcl 10 Mg Tabs (Propranolol Hcl) .... Take 1 Tablet By Mouth Three Times A Day (Out For About A Week) 8)  Multivitamins  Tabs (Multiple Vitamin) .... Take One By Mouth  Once Daily 9)  Moviprep 100 Gm  Solr (Peg-Kcl-Nacl-Nasulf-Na Asc-C) .... As Per Prep Instructions.  Allergies: No Known Drug Allergies  Past History:  Past Medical History: Last updated: 05/01/2010   Hospitalized for hematemesis in Jan. 2011 - negative EGD. On two times a day PPI now.    History of thrombocytopenia.   Diabetes.  Hypertension.   History of tonsillar cancer.   History of colon cancer status post left hemicolectomy at [facility] in November 2010.   History of cirrhosis, diagnosed in 2009.   Anemia, baseline hemoglobin 11.   History of gastroparesis.  History of hepatitis C.  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  Cholelithiasis with chronic thickening of gallbladder.  History of alcoholism.  History of inguinal hernia repair.  History of tuberculosis, treated for 6 months back in 1997.  History of thrush.  History of esophageal stricture.   history of hiatal hernia.   Past Surgical History: Last updated: 01/23/2010 S/p hemicolectomy. S/p throat surgery for cancer. Hernia Surgery  Family History: Last updated: 08/02/2010 Mother has breast cancer and alzhemiers No FH of Colon Cancer:  Social History: Last updated: 01/23/2010 Alcohol use-yes, history if abuse (none since 2008) Retired Warden/ranger - played piano Patient has never smoked.  Alcohol Use - none in 2 years Daily Caffeine Use Single, never married. No relationship currently.  Risk Factors: Alcohol Use: 0 (08/09/2010) Exercise: no (08/09/2010)  Risk Factors: Smoking Status: never (08/09/2010)  Review of Systems       All systems reviewed and negative as per HPI  Physical Exam  General:  alert, well-nourished, well-hydrated, and normal appearance.  alert, well-nourished, well-hydrated, and normal appearance.   Head:  normocephalic and atraumatic.  normocephalic and atraumatic.   Eyes:  pupils equal, pupils round, pupils reactive to light, corneas and lenses clear, and no  injection.  pupils equal, pupils round, pupils reactive to light, corneas and lenses clear, and no injection.   Ears:  R ear normal and L ear normal.  R ear normal and L ear normal.   Nose:  no external deformity, no nasal discharge, and no mucosal pallor.  no external deformity, no nasal discharge, and no mucosal pallor.   Mouth:  pharynx pink and moist, no erythema, and no exudates.  pharynx pink and moist, no erythema, and no exudates.   Neck:  supple, full ROM, and no masses.  supple, full ROM, and no masses.   Chest Wall:  no deformities, no tenderness, and no masses.  no deformities, no tenderness, and no masses.   Lungs:  normal respiratory effort, normal breath sounds, no crackles, and no wheezes.  normal respiratory effort, normal breath sounds, no crackles, and no wheezes.   Heart:  2/6 systolic murmu, rregular rhythm, no gallop, and no rub.  regular rhythm, no gallop, and no rub.   Abdomen:  soft, non-tender,  normal bowel sounds, no distention, no masses, and no guarding.  soft, non-tender, normal bowel sounds, no distention, no masses, and no guarding.   Rectal:  Normal rectal  tone,  no prostatic masses noted, heme  + stool.  Genitalia:  Defered Prostate:  no nodules and no asymmetry.  no nodules and no asymmetry.   Msk:  normal ROM, no joint tenderness, no joint swelling, and no joint warmth.  normal ROM, no joint tenderness, no joint swelling, and no joint warmth.   Pulses:  R radial normal, R posterior tibial normal, R dorsalis pedis normal, L radial normal, L posterior tibial normal, and L dorsalis pedis normal.  R radial normal, R posterior tibial normal, R dorsalis pedis normal, L radial normal, L posterior tibial normal, and L dorsalis pedis normal.   Extremities:  No edema Neurologic:  alert & oriented X3, cranial nerves II-XII intact, strength normal in all extremities, and sensation intact to light touch.  alert & oriented X3, cranial nerves II-XII intact, strength normal in all  extremities, and sensation intact to light touch.   Skin:  turgor normal, color normal, and no rashes.  turgor normal, color normal, and no rashes.   Cervical Nodes:  no anterior cervical adenopathy.  no anterior cervical adenopathy.   Psych:  Oriented X3.  Oriented X3.      Impression & Recommendations:  Problem # 1:  ADENOCARCINOMA, COLON, CECUM, HX OF (ICD-V10.05) Stools were heme positive today, however, patient is scheduled for colonoscopy on 9/14.  Patient will continue to be followed by Dr. Marina Goodell with GI and already has a future follow up appointment with him.    Problem # 2:  DIABETES MELLITUS, TYPE II, CONTROLLED (ICD-250.00)  Last A1C was in 5/11 and was 6.1. Will recheck today and get CBG.  Patient is up to date on his eye exam and foot exam was normal on this visit.  Will continue 20 U lantus at night as patient has not had any hypoglycemic episodes.  Patient is encouraged to continue to check his feet daily and to set up a dilated eye exam in about 3 months at his convenience.   With regards to the medicare representitive.  Patient was instructed not to give any personal information to individuals over the phone and to let us know if he needed a new glucose monitor.    His updated medication list for this problem includes:    Lantus 100 Unit/ml Soln (Insulin glargine) .Marland Kitchen... Take 20 units of lantus each night    Aspirin 81 Mg Tbec (Aspirin) .Marland Kitchen... Take 1 tablet by mouth once a day  Orders: T- Capillary Blood Glucose (82948) T-Hgb A1C (in-house) ([id number])  Problem # 3:  Hx of ESSENTIAL HYPERTENSION, BENIGN (ICD-401.1)  Patient's blood pressure has remained stable.  Will continue to monitor.  Should further intervention be needed consider ACEI given the slightly elevated microalbumin.  Will not initiate therapy at this point given that creatinine to microalbumin ratio is within normal limits.    Problem # 4:  CIRRHOSIS (ICD-571.5) Will follow Dr. Lamar Sprinkles recommendations.   Recent CT showed no signs of HCC.  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  Patient will have follow up with GI in about 6 months with EGD for variceal screening around January of next year.  Patient complained of an in ability to pay for propranolol at this visit so the prescription was sent to [facility] so that patient can get it off thier discounted prescription list.  His updated medication list for this problem includes:    Propranolol Hcl 10 Mg Tabs (Propranolol hcl) .Marland Kitchen... Take 1 tablet by mouth three times a day (out for about a week)  Problem # 5:  GERD (ICD-530.81) No active symptoms.  Patient will continue taking omeprazole.  His updated medication list for this problem includes:    Omeprazole 40 Mg Cpdr (Omeprazole) .Marland Kitchen... Take one tablet daily for reflux.  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   Complete Medication List: 1)  Lantus 100 Unit/ml Soln (Insulin glargine) .... Take 20 units of lantus each night 2)  Omeprazole 40 Mg Cpdr (Omeprazole) .... Take one tablet daily for reflux. 3)  Monoject Insulin Safety Syr 29g X 1/2" 1 Ml Misc (Insulin syringe-needle u-100) .... Use as directed. 4)  Folic Acid 800 Mcg Tabs (Folic acid) .... Take one by mouth once daily 5)  Lancets Misc (Lancets) .... Use to test blood glucose 3x daily 6)  Accu-chek Aviva Strp (Glucose blood) .... To test blood glucose 3x  daily 7)  Propranolol Hcl 10 Mg Tabs (Propranolol hcl) .... Take 1 tablet by mouth three times a day (out for about a week) 8)  Multivitamins Tabs (Multiple vitamin) .... Take one by mouth once daily 9)  Moviprep 100 Gm Solr (Peg-kcl-nacl-nasulf-na asc-c) .... As per prep instructions. 10)  Aspirin 81 Mg Tbec (Aspirin) .... Take 1 tablet by mouth once a day  Other Orders: T-PSA ([id number])  Patient Instructions: 1)  Continue to  take medications as prescribed and go to scheduled colonoscopy next month.  Prescriptions: OMEPRAZOLE 40 MG CPDR (OMEPRAZOLE) Take one tablet daily for reflux.  #32 x 6   Entered and Authorized by:   Sinda Du MD   Signed by:   Sinda Du MD on 08/09/2010   Method used:   Electronically to        [facility] (retail)       [address]       [place], [place]  [address]       Ph: [phone]       Fax: [phone]   RxID:   [id number] PROPRANOLOL HCL 10 MG TABS (PROPRANOLOL HCL) Take 1 tablet by mouth three times a day (out for about a week)  #90 x 3   Entered and Authorized by:   Sinda Du MD   Signed by:   Sinda Du MD on 08/09/2010   Method used:   Electronically to        [facility] (retail)       [address]       [place], [place]  [address]       Ph: [phone]       Fax: [phone]   RxID:   [id number]  Process Orders Check Orders Results:     [facility]: Check successful Tests Sent for requisitioning (August 09, 2010 4:37 PM):     08/09/2010: [facility] -- T-PSA [id number] (signed)     Prevention & Chronic Care Immunizations   Influenza vaccine: Fluvax Non-MCR  (10/23/2007)   Influenza vaccine deferral: Not indicated  (01/23/2010)    Tetanus booster: Not documented   Td booster deferral: Not indicated  (11/02/2009)  Pneumococcal vaccine: Pneumovax  (08/02/2010)   Pneumococcal vaccine deferral: Not indicated  (01/23/2010)    H. zoster vaccine: Not documented   H. zoster vaccine deferral: Not indicated  (11/02/2009)  Colorectal Screening   Hemoccult: Not documented   Hemoccult action/deferral: Not indicated  (11/02/2009)    Colonoscopy: Location:  [facility].    (09/12/2009)  Other Screening   PSA: Not documented   PSA ordered.   PSA action/deferral: Not indicated  (11/02/2009)   Smoking status: never   (08/09/2010)  Diabetes Mellitus   HgbA1C: 6.1  (05/01/2010)    Eye exam: Not documented   Diabetic eye exam action/deferral: Not indicated  (01/23/2010)   Eye exam due: 03/31/2010    Foot exam: yes  (11/02/2009)   High risk foot: Not documented   Foot care education: Not documented   Foot exam due: 11/02/2010    Urine microalbumin/creatinine ratio: 11.4  (01/23/2010)   Urine microalbumin action/deferral: Ordered  Lipids   Total Cholesterol: 116  (01/05/2010)   LDL: 60  (01/05/2010)   LDL Direct: Not documented   HDL: 43  (01/05/2010)   Triglycerides: 66  (01/05/2010)   Lipid panel due: 02/06/2009  Hypertension   Last Blood Pressure: 130 / 77  (08/09/2010)   Serum creatinine: 1.1  (08/02/2010)   Serum potassium 5.2  (07/11/2009)  Self-Management Support :   Personal Goals (by the next clinic visit) :     Personal A1C goal: 7  (01/23/2010)     Personal blood pressure goal: 130/80  (01/23/2010)     Personal LDL goal: 100  (01/23/2010)    Patient will work on the following items until the next clinic visit to reach self-care goals:     Medications and monitoring: take my medicines every day, check my blood sugar, bring all of my medications to every visit, examine my feet every day  (08/09/2010)     Eating: drink diet soda or water instead of juice or soda, eat more vegetables, use fresh or frozen vegetables, eat foods that are low in salt, eat baked foods instead of fried foods, eat fruit for snacks and desserts, limit or avoid alcohol  (08/09/2010)     Other: trying to gain weight so eats "what I can get or feel like eating - do avoid sweets" - does not want to start any exercise - does walk up and down 3 flights of stairs where he lives - did not bring meter but willing to do so next visit  (01/23/2010)    Diabetes self-management support: Resources for patients handout  (08/09/2010)    Hypertension self-management support: Resources for patients handout  (08/09/2010)       Resource handout printed.

## 2011-02-01 NOTE — Assessment & Plan Note (Signed)
Summary: hfu [mkj]   Vital Signs:  Patient profile:   70 year old male Height:      72 inches Weight:      159.1 pounds BMI:     21.66 O2 Sat:      99 % on Room air Temp:     97.4 degrees F Pulse rate:   86 / minute BP sitting:   115 / 74  (left arm) BP standing:   124 / 75  (left arm) Cuff size:   regular  Vitals Entered By: Dorie Rank RN (January 23, 2010 1:59 PM)  O2 Flow:  Room air CC: hospital follow up - has only been taking Protonix since discharge (pills) - has been taking insulin Is Patient Diabetic? Yes Did you bring your meter with you today? No Pain Assessment Patient in pain? no      Nutritional Status BMI of 19 -24 = normal CBG Result 144  Have you ever been in a relationship where you felt threatened, hurt or afraid?No   Does patient need assistance? Functional Status Self care Ambulation Normal   Primary Care Provider:  Elby Showers, MD  CC:  hospital follow up - has only been taking Protonix since discharge (pills) - has been taking insulin.  History of Present Illness: No complaints at this visit. No nausea, no vomiting, no abd pain. No GERD sxs, no consitpation, no diarrhea, no feelings of early satiety. No report of bloody stools or dark, tarry stools. He has a followup appointment with Dr. Marina Goodell, GI, already scheduled as well as Dr. Teressa Lower, urology. He was contemplating canceling those appointments, but decided to keep them after a brief discussion regarding his medical conditions. He states that he is feeling so well that he would have never come today - but the appointment was scheduled so he came.  Preventive Screening-Counseling & Management  Alcohol-Tobacco     Alcohol drinks/day: 0     Alcohol type: beer,wine,liquor     Smoking Status: never  Caffeine-Diet-Exercise     Does Patient Exercise: no  Current Medications (verified): 1)  Lantus 100 Unit/ml  Soln (Insulin Glargine) .... Take 22 Units of Lantus Each Night 2)  Protonix 40 Mg  Pack (Pantoprazole Sodium) .... Take 1 Tablet By Mouth Two Times A Day 3)  Monoject Insulin Safety Syr 29g X 1/2" 1 Ml Misc (Insulin Syringe-Needle U-100) 4)  Folic Acid 1 Mg  Tabs (Folic Acid) .... Take 1 Tablet By Mouth Once A Day 5)  Lancets   Misc (Lancets) .... Use To Test Blood Glucose 3x Daily 6)  Accu-Chek Aviva  Strp (Glucose Blood) .... To Test Blood Glucose 3x  Daily 7)  Propranolol Hcl 10 Mg Tabs (Propranolol Hcl) .... Take 1 Tablet By Mouth Three Times A Day  Allergies (verified): No Known Drug Allergies  Past History:  Family History: Last updated: 03/26/2008 Mother has breast cancer and alzhemiers  Social History: Last updated: 01/23/2010 Alcohol use-yes, history if abuse (none since 2008) Retired Warden/ranger - played piano Patient has never smoked.  Alcohol Use - none in 2 years Daily Caffeine Use Single, never married. No relationship currently.  Risk Factors: Alcohol Use: 0 (01/23/2010) Exercise: no (01/23/2010)  Risk Factors: Smoking Status: never (01/23/2010)  Past Medical History: Hospitalized for hematemesis in Jan. 2011 - negative EGD. On two times a day PPI now. S/p chemoradiation for prostate cancer. 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 S/p throat surgery for cancer. Hernia Surgery  Social History: Alcohol use-yes, history if abuse (none since 2008) Retired Warden/ranger - played piano Patient has never smoked.  Alcohol Use - none in 2 years Daily Caffeine Use Single, never married. No relationship currently.  Review of Systems      See HPI GI:  Denies abdominal pain, bloody stools, change in bowel habits,  constipation, dark tarry stools, diarrhea, excessive appetite, indigestion, loss of appetite, nausea, vomiting, and vomiting blood.  Physical Exam  General:  alert, well-developed, well-nourished, well-hydrated, appropriate dress, normal appearance, and healthy-appearing.   Head:  normocephalic and atraumatic.   Eyes:  vision grossly intact, pupils equal, pupils round, and pupils reactive to light, arcus senilis present bilaterally.   Ears:  no external deformities.   Nose:  no external deformity, no external erythema, and no nasal discharge.   Mouth:  pharynx pink and moist, no erythema, and no exudates.   Neck:  supple, full ROM, no masses, no thyromegaly, no thyroid nodules or tenderness, no cervical lymphadenopathy, and no neck tenderness.   Lungs:  normal respiratory effort, no intercostal retractions, no accessory muscle use, normal breath sounds, no crackles, and no wheezes.   Heart:  normal rate, regular rhythm, no murmur, no gallop, and no rub.   Abdomen:  soft, non-tender, normal bowel sounds, no distention, no guarding, no rigidity, and no rebound tenderness.   Msk:  normal ROM, no joint deformities, and no joint instability.   Neurologic:  alert & oriented X3, cranial nerves II-XII intact, strength normal in all extremities, and gait normal.   Skin:  turgor normal, color normal, and no rashes.   Cervical Nodes:  no anterior cervical adenopathy.   Psych:  Oriented X3, memory intact for recent and remote, normally interactive, good eye contact, not anxious appearing, and not depressed appearing.     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 STOMACH, POLYP, BIOPSY: HYPERPLASTIC GASTRIC MUCOSA WITH ASSOCIATED MILD CHRONIC INFLAMMATION AND SURFACE EROSION, NO EVIDENCE OF HELICOBACTER PYLORI, INTESTINAL METAPLASIA, DYSPLASIA OR MALIGNANCY IDENTIFIED.  At this time, patient has a follow up appointment with Dr. Marina Goodell which he was encouraged to keep. No further symptoms of hematemesis, bowel changes, nausea, or abdominal pain since d/c. Repeat Hgb today at 11.0, up from 10.6 at discharge. Continue protonix.  His updated medication list for this problem includes:    Protonix 40 Mg Pack (Pantoprazole sodium) .Marland Kitchen... Take 1 tablet by mouth two times a day  Orders: T-CBC No Diff ([id number])  Problem # 2:  CIRRHOSIS (ICD-571.5) A physician named Dr. Cleopatra Cedar had been prescribing some medications for the patient in the past, including propranolol. He is on the lowest dose at this time - likely for portal hypertension, which  stigmata was not seen on recent EGD. Patient wished to continue his propranolol after discussion of risks/benefits and recent EGD results. Will refill at this visit.  Problem # 3:  DIABETES MELLITUS, TYPE II, CONTROLLED (ICD-250.00) Patient with last several HgbA1c levels at goal. Patient appears to have titrated his lantus dose as we had him (in the EMR) at 40 units daily prior to hospitalization in Jan. 2011 and he reported taking only 22 units daily. Will check microalbumin/Cr ratio today and patient due for eye appointment in April per his report. Please refer to ophthalmology at his next appointment. Due date scheduled in EMR as reminder.   His updated medication list for this problem includes:    Lantus 100 Unit/ml Soln (Insulin glargine) .Marland Kitchen... Take 22 units of lantus each night  Orders: Capillary Blood Glucose/CBG  ([address])  Complete Medication List: 1)  Lantus 100 Unit/ml Soln (Insulin glargine) .... Take 22 units of lantus each night 2)  Protonix 40 Mg Pack (Pantoprazole sodium) .... Take 1 tablet by mouth two times a day 3)  Monoject Insulin Safety Syr 29g X 1/2" 1 Ml Misc (Insulin syringe-needle u-100) 4)  Folic Acid 1 Mg Tabs (Folic acid) .... Take 1 tablet by mouth once a day 5)  Lancets Misc (Lancets) .... Use to test blood glucose 3x daily 6)  Accu-chek Aviva Strp (Glucose blood) .... To test blood glucose 3x  daily 7)  Propranolol Hcl 10 Mg Tabs (Propranolol hcl) .... Take 1 tablet by mouth three times a day  Other Orders: T-Urine Microalbumin w/creat. ratio [id number])  Patient Instructions: 1)  Please schedule a follow-up appointment in 3 months. 2)  Please remember to bring your glucometer at your next visit. Prescriptions: PROPRANOLOL HCL 10 MG TABS (PROPRANOLOL HCL) Take 1 tablet by mouth three times a day  #90 x 3   Entered and Authorized by:   Nilda Riggs MD   Signed by:   Nilda Riggs MD on 01/23/2010   Method used:   Electronically to        [facility] (retail)       [address]       [place], [place]  [address]       Ph: [phone]       Fax: [phone]   RxID:   [id number]   Process Orders Check Orders Results:     [facility]: Check successful Tests Sent for requisitioning (January 28, 2010 6:52 PM):     01/23/2010: [facility] -- T-Urine Microalbumin w/creat. ratio [[id number]] (signed)     01/23/2010: [facility] -- T-CBC No Diff [[id number]] (signed)    Prevention & Chronic Care Immunizations   Influenza vaccine: Fluvax Non-MCR  (10/23/2007)   Influenza vaccine deferral: Not indicated  (01/23/2010)    Tetanus booster: Not documented   Td booster deferral: Not indicated  (11/02/2009)    Pneumococcal vaccine: Not documented   Pneumococcal vaccine deferral:  Not indicated  (01/23/2010)    H. zoster vaccine: Not documented   H. zoster vaccine deferral: Not indicated  (11/02/2009)    Immunization comments: Rec'd flu vax during hospitalization in Jan 2011. May have rec'd pneumovax at that time as well per history.  Colorectal Screening   Hemoccult: Not documented   Hemoccult action/deferral: Not indicated  (11/02/2009)    Colonoscopy: Location:  [facility].    (09/12/2009)  Other Screening   PSA: Not documented   PSA action/deferral: Not indicated  (11/02/2009)  Smoking status: never  (01/23/2010)  Diabetes Mellitus   HgbA1C: 5.5  (01/05/2010)    Eye exam: Not documented   Diabetic eye exam action/deferral: Not indicated  (01/23/2010)   Eye exam due: 03/31/2010    Foot exam: yes  (11/02/2009)   High risk foot: Not documented   Foot care education: Not documented   Foot exam due: 11/02/2010    Urine microalbumin/creatinine ratio: 8.1  (01/06/2009)   Urine microalbumin action/deferral: Ordered    Diabetes flowsheet reviewed?: Yes   Progress toward A1C goal: At goal  Lipids   Total Cholesterol: 116  (01/05/2010)   LDL: 60  (01/05/2010)   LDL Direct: Not documented   HDL: 43  (01/05/2010)   Triglycerides: 66  (01/05/2010)   Lipid panel due: 02/06/2009  Self-Management Support :   Personal Goals (by the next clinic visit) :     Personal A1C goal: 7  (01/23/2010)     Personal blood pressure goal: 130/80  (01/23/2010)     Personal LDL goal: 100  (01/23/2010)    Patient will work on the following items until the next clinic visit to reach self-care goals:     Medications and monitoring: take my medicines every day, check my blood sugar, bring all of my medications to every visit, examine my feet every day  (01/23/2010)     Eating: drink diet soda or water instead of juice or soda, eat fruit for snacks and desserts  (01/23/2010)     Other: trying to gain weight so eats "what I can get or feel like eating - do avoid  sweets" - does not want to start any exercise - does walk up and down 3 flights of stairs where he lives - did not bring meter but willing to do so next visit  (01/23/2010)    Diabetes self-management support: Written self-care plan  (01/23/2010)   Diabetes care plan printed

## 2011-02-01 NOTE — Letter (Signed)
Summary: [facility] Instructions  [facility]  [address] [place], [place] [address]   Phone: [phone]  Fax: [phone]       BROUGHTON EPPINGER    January 01, 1941    MRN: [id number]        Procedure Day /Date:Thursday, 11/02/10     Arrival Time:9:00 am     Procedure Time:10:00 am     Location of Procedure:                    x [facility] (4th Floor)   PREPARATION FOR COLONOSCOPY WITH MOVIPREP   Starting 5 days prior to your procedure 10/28/10 do not eat nuts, seeds, popcorn, corn, beans, peas,  salads, or any raw vegetables.  Do not take any fiber supplements (e.g. Metamucil, Citrucel, and Benefiber).  THE DAY BEFORE YOUR PROCEDURE         DATE: 11/01/10  DAY: WEDNESDAY  1.  Drink clear liquids the entire day-NO SOLID FOOD  2.  Do not drink anything colored red or purple.  Avoid juices with pulp.  No orange juice.  3.  Drink at least 64 oz. (8 glasses) of fluid/clear liquids during the day to prevent dehydration and help the prep work efficiently.  CLEAR LIQUIDS INCLUDE: Water Jello Ice Popsicles Tea (sugar ok, no milk/cream) Powdered fruit flavored drinks Coffee (sugar ok, no milk/cream) Gatorade Juice: apple, white grape, white cranberry  Lemonade Clear bullion, consomm, broth Carbonated beverages (any kind) Strained chicken noodle soup Hard Candy                             4.  In the morning, mix first dose of MoviPrep solution:    Empty 1 Pouch A and 1 Pouch B into the disposable container    Add lukewarm drinking water to the top line of the container. Mix to dissolve    Refrigerate (mixed solution should be used within 24 hrs)  5.  Begin drinking the prep at 5:00 p.m. The MoviPrep container is divided by 4 marks.   Every 15 minutes drink the solution down to the next mark (approximately 8 oz) until the full liter is complete.   6.  Follow completed prep with 16 oz of clear liquid of your choice (Nothing red or purple).   Continue to drink clear liquids until bedtime.  7.  Before going to bed, mix second dose of MoviPrep solution:    Empty 1 Pouch A and 1 Pouch B into the disposable container    Add lukewarm drinking water to the top line of the container. Mix to dissolve    Refrigerate  THE DAY OF YOUR PROCEDURE      DATE: 11/02/10 DAY: THURSDAY  Beginning at 5:00 a.m. (5 hours before procedure):         1. Every 15 minutes, drink the solution down to the next mark (approx 8 oz) until the full liter is complete.  2. Follow completed prep with 16 oz. of clear liquid of your choice.    3. You may drink clear liquids until 8:00 AM (2 HOURS BEFORE PROCEDURE).   MEDICATION INSTRUCTIONS  Unless otherwise instructed, you should take regular prescription medications with a small sip of water   as early as possible the morning of your procedure.         OTHER INSTRUCTIONS  You will need a responsible adult at least 68 years of age to  accompany you and drive you home.   This person must remain in the waiting room during your procedure.  Wear loose fitting clothing that is easily removed.  Leave jewelry and other valuables at home.  However, you may wish to bring a book to read or  an iPod/MP3 player to listen to music as you wait for your procedure to start.  Remove all body piercing jewelry and leave at home.  Total time from sign-in until discharge is approximately 2-3 hours.  You should go home directly after your procedure and rest.  You can resume normal activities the  day after your procedure.  The day of your procedure you should not:   Drive   Make legal decisions   Operate machinery   Drink alcohol   Return to work  You will receive specific instructions about eating, activities and medications before you leave.    The above instructions have been reviewed and explained to me by   _______________________    I fully understand and can verbalize these instructions  _____________________________ Date _________

## 2011-02-01 NOTE — Progress Notes (Signed)
Summary: Medication  Phone Note Call from Patient Call back at Home Phone [phone]   Caller: Patient Call For: Dr. Marina Goodell Reason for Call: Talk to Nurse Summary of Call: The Moviprep is too expensive...Marland Kitchenneeds an alternative Initial call taken by: Karna Christmas,  September 15, 2010 4:39 PM  Follow-up for Phone Call        rescheduled Colon for 11/02/10.  Patient will come by and get prep next week and new instructions for his procedure. Follow-up by: Milford Cage Chester,  September 15, 2010 4:52 PM

## 2011-02-01 NOTE — Progress Notes (Signed)
Summary: Prior Authorization for Pantoprazole-Denied  Phone Note Outgoing Call   Call placed by: Angelina Ok RN,  March 28, 2010 2:07 PM Call placed to: Insurer Summary of Call: Call to Medicare for Prior Authorization for pt's Pantoprazole.-Denied.  Pt will need a letter stating Medical Necessity .  Letter can be faxed to [phone]. Initial call taken by: Angelina Ok RN,  March 28, 2010 2:09 PM  Follow-up for Phone Call        will change to omeprazole.  do not worry about the authorization.  thanks.    New/Updated Medications: OMEPRAZOLE 40 MG CPDR (OMEPRAZOLE) Take one tablet daily for reflux. Prescriptions: OMEPRAZOLE 40 MG CPDR (OMEPRAZOLE) Take one tablet daily for reflux.  #32 x 6   Entered and Authorized by:   Elby Showers MD   Signed by:   Elby Showers MD on 04/11/2010   Method used:   Electronically to        [facility] (retail)       [address]       [place], [place]  [id number]       Ph: [phone]       Fax: [phone]   RxID:   [id number]

## 2011-02-01 NOTE — Assessment & Plan Note (Signed)
Summary: Pryor Montes # 3  Nurse Visit   Allergies: No Known Drug Allergies  Immunizations Administered:  TwinRix # 3:    Vaccine Type: TwinRix    Site: left deltoid    Mfr: GlaxoSmithKline    Dose: 1.0 ml    Route: IM    Given by: Chales Abrahams CMA (AAMA)    Exp. Date: 10/06/2012    Lot #: [id number]    VIS given: 09/18/07 version given August 31, 2010.  Orders Added: 1)  TwinRix 1ml ( Hep A&B Adult dose) [90636] 2)  Admin 1st Vaccine [90471]

## 2011-02-01 NOTE — Progress Notes (Signed)
Summary: med refill/gp  Phone Note Refill Request Message from:  Fax from Pharmacy on October 12, 2010 9:40 AM  Pt. request refill on Syringes 31gauge , 0.3cc, short needle  #100   Use as directed. He said the ones he received (filled by Dr. Cathey Endow) are too big.   Method Requested: Electronic Initial call taken by: Chinita Pester RN,  October 12, 2010 9:40 AM    New/Updated Medications: ELITE-THIN INSULIN SYRINGE 31G X 5/16" 0.5 ML MISC (INSULIN SYRINGE-NEEDLE U-100) use as directed Prescriptions: ELITE-THIN INSULIN SYRINGE 31G X 5/16" 0.5 ML MISC (INSULIN SYRINGE-NEEDLE U-100) use as directed  #100 x 11   Entered and Authorized by:   Zoila Shutter MD   Signed by:   Zoila Shutter MD on 10/12/2010   Method used:   Electronically to        [facility] (retail)       [address]       [place], [place]  [id number]       Ph: [phone]       Fax: [phone]   RxID:   [id number]

## 2011-02-01 NOTE — Progress Notes (Signed)
Summary: med refill/gp  Phone Note Refill Request Message from:  Fax from Pharmacy on March 23, 2010 11:01 AM  Request refill on Insulin Syringes 31G, 0.3cc Short needle    #100.  Use as directed.   Method Requested: Electronic Initial call taken by: Chinita Pester RN,  March 23, 2010 11:01 AM    New/Updated Medications: MONOJECT INSULIN SAFETY SYR 29G X 1/2" 1 ML MISC (INSULIN SYRINGE-NEEDLE U-100) Use as directed. Prescriptions: MONOJECT INSULIN SAFETY SYR 29G X 1/2" 1 ML MISC (INSULIN SYRINGE-NEEDLE U-100) Use as directed.  #100 x prn   Entered and Authorized by:   Elby Showers MD   Signed by:   Elby Showers MD on 03/23/2010   Method used:   Electronically to        [facility] (retail)       [address]       [place], [place]  [id number]       Ph: [phone]       Fax: [phone]   RxID:   [id number]

## 2011-02-01 NOTE — Letter (Signed)
Summary: [facility]  [facility]   Imported By: Sherian Rein 01/16/2010 10:22:59  _____________________________________________________________________  External Attachment:    Type:   Image     Comment:   External Document

## 2011-02-01 NOTE — Assessment & Plan Note (Signed)
Summary: problems, swallowing/cfb(bowen)   Vital Signs:  Patient profile:   70 year old male Height:      72 inches (182.88 cm) Weight:      178.0 pounds (80.91 kg) BMI:     24.23 Temp:     97.2 degrees F (36.22 degrees C) oral Pulse rate:   69 / minute BP sitting:   112 / 67  (right arm)  Vitals Entered By: Stanton Kidney Ditzler RN (August 22, 2010 10:50 AM) Is Patient Diabetic? Yes Did you bring your meter with you today? No Pain Assessment Patient in pain? yes     Location: throat Intensity: 8 Type: hurts Onset of pain  past 3 days Nutritional Status BMI of 19 -24 = normal Nutritional Status Detail appetite good CBG Result 162  Have you ever been in a relationship where you felt threatened, hurt or afraid?denies   Does patient need assistance? Functional Status Self care Ambulation Normal Comments Past 3 days throat hurts with eating and swollowing.   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 no fever, chills, sick contacts, it has been ongoing for 3 days. appetite preserved, eating well but has been having difficulty swallowing. sore throat pain is worse when pt swallows.  Patient is feeling well and denies CP, abdominal pain, nausea, vomiting, HA's, palpitations, blurred vision. fever, chills, diarrhea, constipation or SOB.         Depression History:      The patient denies a depressed mood most of the day and a diminished interest in his usual daily activities.         Preventive Screening-Counseling & Management  Alcohol-Tobacco     Alcohol drinks/day: 0     Alcohol type: beer,wine,liquor     Smoking Status: never  Caffeine-Diet-Exercise     Does Patient Exercise: no  Current Medications (verified): 1)  Lantus 100  Unit/ml  Soln (Insulin Glargine) .... Take 20 Units of Lantus Each Night 2)  Omeprazole 40 Mg Cpdr (Omeprazole) .... Take One Tablet Daily For Reflux. 3)  Monoject Insulin Safety Syr 29g X 1/2" 1 Ml Misc (Insulin Syringe-Needle U-100) .... Use As Directed. 4)  Folic Acid 800 Mcg Tabs (Folic Acid) .... Take One By Mouth Once Daily 5)  Lancets   Misc (Lancets) .... Use To Test Blood Glucose 3x Daily 6)  Accu-Chek Aviva  Strp (Glucose Blood) .... To Test Blood Glucose 3x  Daily 7)  Propranolol Hcl 10 Mg Tabs (Propranolol Hcl) .... Take 1 Tablet By Mouth Three Times A Day (Out For About A Week) 8)  Multivitamins  Tabs (Multiple Vitamin) .... Take One By Mouth Once Daily 9)  Moviprep 100 Gm  Solr (Peg-Kcl-Nacl-Nasulf-Na Asc-C) .... As Per Prep Instructions. 10)  Aspirin 81 Mg Tbec (Aspirin) .... Take 1 Tablet By Mouth Once A Day 11)  First-Dukes Mouthwash  Susp (Diphenhyd-Hydrocort-Nystatin) .Marland Kitchen.. 1 Table Spoon Swish and Swallow Every 6 Hours, Take For One Week.  Allergies (verified): No Known Drug Allergies  Review of Systems       As per HPI  Physical Exam  General:  alert, well-nourished, well-hydrated, and normal appearance.  alert, well-nourished, well-hydrated, and normal appearance.   Mouth:  pharynx pink and moist, mild erythema, and white plaque on mouth and tongue which can be  scratched off. Neck:  supple, full ROM, and no masses.  supple, full ROM, and no masses.   Lungs:  normal respiratory effort, normal breath sounds, no crackles, and no wheezes.  normal respiratory effort, normal breath sounds, no crackles, and no wheezes.   Heart:  2/6 systolic murmu, rregular rhythm, no gallop, and no rub.  regular rhythm, no gallop, and no rub.     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   Problem # 2:  Hx of ESSENTIAL HYPERTENSION, BENIGN (ICD-401.1) Assessment: Comment Only Well controlled on current treatment, No new changes made today, Will continue to monitor.   His updated medication list for this problem includes:    Propranolol Hcl 10 Mg Tabs (Propranolol hcl) .Marland Kitchen... 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 Take 20 units of lantus each night 2)  Omeprazole 40 Mg Cpdr (Omeprazole) .... Take one tablet daily for reflux. 3)  Monoject Insulin Safety Syr 29g X 1/2" 1 Ml Misc (Insulin syringe-needle u-100) .... Use as directed. 4)  Folic Acid 800 Mcg Tabs (Folic acid) .... Take one by mouth once daily 5)  Lancets Misc (Lancets) .... Use to test blood glucose 3x daily 6)  Accu-chek Aviva Strp (Glucose blood) .... To test blood glucose 3x  daily 7)  Propranolol Hcl 10 Mg Tabs (Propranolol hcl) .... Take 1 tablet by mouth three times a day (out for about a week) 8)  Multivitamins Tabs (Multiple vitamin) .... Take one by mouth once daily 9)  Moviprep 100 Gm Solr (Peg-kcl-nacl-nasulf-na asc-c) .... As per prep instructions. 10)  Aspirin 81 Mg Tbec (Aspirin) .... Take 1 tablet by mouth once a day 11)  First-dukes Mouthwash Susp (Diphenhyd-hydrocort-nystatin) .Marland Kitchen.. 1 table spoon swish and swallow every 6 hours, take for one week.  Other Orders: T- Capillary Blood Glucose (64403) T-Hgb A1C (in-house) ([id number])  Patient Instructions: 1)  Please schedule a follow-up appointment on august 31 with  Dr. Cathey Endow.  Prescriptions: FIRST-DUKES MOUTHWASH  SUSP (DIPHENHYD-HYDROCORT-NYSTATIN) 1 table spoon swish and swallow every 6 hours, take for one week.  #1 x 2   Entered and Authorized by:   Darnelle Maffucci MD   Signed by:   Darnelle Maffucci MD on 08/22/2010   Method used:   Electronically to        [facility] (retail)       [address]       [place], [place]  [id number]       Ph: [phone]       Fax: [phone]   RxID:   [id number]    Prevention & Chronic Care Immunizations   Influenza vaccine: Fluvax Non-MCR  (10/23/2007)   Influenza vaccine deferral: Not indicated  (01/23/2010)    Tetanus booster: Not documented   Td booster  deferral: Not indicated  (11/02/2009)    Pneumococcal vaccine: Pneumovax  (08/02/2010)   Pneumococcal vaccine deferral: Not indicated  (01/23/2010)    H. zoster vaccine: Not documented   H. zoster vaccine deferral: Not indicated  (11/02/2009)  Colorectal Screening   Hemoccult: Not documented   Hemoccult action/deferral: Not indicated  (11/02/2009)    Colonoscopy: Location:  [facility].    (09/12/2009)  Other Screening   PSA: Not documented   PSA action/deferral: Not indicated  (11/02/2009)   Smoking status: never  (08/22/2010)  Diabetes Mellitus   HgbA1C: 6.9  (08/22/2010)    Eye exam: Not documented   Diabetic eye exam action/deferral: Not indicated  (01/23/2010)   Eye exam due: 03/31/2010    Foot exam: yes  (11/02/2009)   High risk foot: Not documented   Foot care education: Not documented   Foot exam due: 11/02/2010    Urine microalbumin/creatinine ratio: 11.4  (01/23/2010)   Urine microalbumin action/deferral: Ordered  Lipids   Total Cholesterol: 116  (01/05/2010)   LDL: 60  (01/05/2010)   LDL Direct: Not documented   HDL: 43  (01/05/2010)   Triglycerides: 66  (01/05/2010)   Lipid panel due: 02/06/2009  Hypertension   Last Blood Pressure: 112 / 67  (08/22/2010)   Serum  creatinine: 1.1  (08/02/2010)   Serum potassium 5.2  (07/11/2009)  Self-Management Support :   Personal Goals (by the next clinic visit) :     Personal A1C goal: 7  (01/23/2010)     Personal blood pressure goal: 130/80  (01/23/2010)     Personal LDL goal: 100  (01/23/2010)    Patient will work on the following items until the next clinic visit to reach self-care goals:     Medications and monitoring: take my medicines every day, check my blood sugar, check my blood pressure, bring all of my medications to every visit, weigh myself weekly, examine my feet every day  (08/22/2010)     Eating: drink diet soda or water instead of juice or soda, eat more vegetables, use fresh or frozen vegetables, eat foods that are low in salt, eat baked foods instead of fried foods, eat fruit for snacks and desserts, limit or avoid alcohol  (08/22/2010)     Activity: take a 30 minute walk every day, park at the far end of the parking lot  (08/22/2010)     Other: trying to gain weight so eats "what I can get or feel like eating - do avoid sweets" - does not want to start any exercise - does walk up and down 3 flights of stairs where he lives - did not bring meter but willing to do so next visit  (01/23/2010)    Diabetes self-management support: Written self-care plan, Education handout, Resources for patients handout  (08/22/2010)   Diabetes care plan printed   Diabetes education handout printed    Hypertension self-management support: Written self-care plan, Education handout, Resources for patients handout  (08/22/2010)   Hypertension self-care plan printed.   Hypertension education handout printed      Resource handout printed.  Laboratory Results   Blood Tests   Date/Time Received: August 22, 2010 11:01 AM  Date/Time Reported: Burke Keels  August 22, 2010 11:01 AM   HGBA1C: 6.9%   (Normal Range: Non-Diabetic - 3-6%   Control Diabetic - 6-8%) CBG Random:: 162mg /dL

## 2011-02-01 NOTE — Progress Notes (Signed)
Summary: refill/ hla  Phone Note Refill Request Message from:  Fax from Pharmacy on March 02, 2010 2:37 PM  Refills Requested: Medication #1:  LANTUS 100 UNIT/ML  SOLN Take 22 units of lantus each night   Last Refilled: 11/26 last visit 1/24, labs 1/24  Initial call taken by: Marin Roberts RN,  March 02, 2010 2:39 PM    Prescriptions: LANTUS 100 UNIT/ML  SOLN (INSULIN GLARGINE) Take 22 units of lantus each night  #4 vials x 6   Entered and Authorized by:   Elby Showers MD   Signed by:   Elby Showers MD on 03/04/2010   Method used:   Electronically to        [facility] (retail)       [address]       [place], [place]  [id number]       Ph: [phone]       Fax: [phone]   RxID:   [id number]

## 2011-02-01 NOTE — Assessment & Plan Note (Signed)
Summary: RA/F/U VISIT PER TOBBIA/[facility]   Vital Signs:  Patient profile:   70 year old male Height:      72 inches (182.88 cm) Weight:      178.06 pounds (80.94 kg) BMI:     24.24 Temp:     98.4 degrees F (36.89 degrees C) oral Pulse rate:   73 / minute BP sitting:   125 / 78  (left arm)  Vitals Entered By: Angelina Ok RN (August 30, 2010 2:58 PM) CC: Ginette Pitman Is Patient Diabetic? Yes Did you bring your meter with you today? No Pain Assessment Patient in pain? no      Nutritional Status BMI of 19 -24 = normal CBG Result 153  Have you ever been in a relationship where you felt threatened, hurt or afraid?No   Does patient need assistance? Functional Status Self care Ambulation Normal Comments Recheck throat.  Has taken  meds for.  Needs refills on meds.     Primary Care Provider:  Elby Showers, MD  CC:  Ginette Pitman.  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 The patient was recently seen on 8/23 by Dr. Gilford Rile for pain with swallowing x 1 week and was given nystatin swish and swallow.  Upon initiation of nystatin, swallowing was improved within two days.  At this point, patient is no longer having any symptoms and he has noticed the whiteness on his tongue has resolved. With regards to his DM, the patients last A1C was 6.9, he is not checking his CBGs, however, he does take 20U lantus daily. The patient also checks his feet daily. The patient states that he does need refills on several of his medications. and denies  blood in stools or dark stools though he was heme +at his last visit.  Appetite good, no fatigue, no abdominal pain, no weight loss, gain, fevers or chills.  Depression History:      The patient denies a depressed mood most of the day and a diminished interest in his  usual daily activities.         Preventive Screening-Counseling & Management  Alcohol-Tobacco     Alcohol drinks/day: 0     Alcohol type: all     Smoking Status: never  Medications Prior to Update: 1)  Lantus 100 Unit/ml  Soln (Insulin Glargine) .... Take 20 Units of Lantus Each Night 2)  Omeprazole 40 Mg Cpdr (Omeprazole) .... Take One Tablet Daily For Reflux. 3)  Monoject Insulin Safety Syr 29g X 1/2" 1 Ml Misc (Insulin Syringe-Needle U-100) .... Use As Directed. 4)  Folic Acid 800 Mcg Tabs (Folic Acid) .... Take One By Mouth Once Daily 5)  Lancets   Misc (Lancets) .... Use To Test Blood Glucose 3x Daily 6)  Accu-Chek Aviva  Strp (Glucose Blood) .... To Test Blood Glucose 3x  Daily 7)  Propranolol Hcl 10 Mg Tabs (Propranolol Hcl) .... Take 1 Tablet By Mouth Three Times A Day (Out For About A Week) 8)  Multivitamins  Tabs (Multiple Vitamin) .... Take One By Mouth Once Daily 9)  Moviprep 100 Gm  Solr (Peg-Kcl-Nacl-Nasulf-Na Asc-C) .... As Per Prep Instructions. 10)  Aspirin 81 Mg Tbec (Aspirin) .... Take 1 Tablet By Mouth Once A Day 11)  Nystatin 100000 Unit/ml Susp (Nystatin) .... Take 1 Tea Spoon  By Mouth, Swish and Swallow Four Times A Day For 1 Week.  Allergies: No Known Drug Allergies  Past History:  Past Medical History: Last updated: 05/01/2010   Hospitalized for hematemesis in Jan. 2011 - negative EGD. On two times a day PPI now.    History of thrombocytopenia.   Diabetes.  Hypertension.   History of tonsillar cancer.   History of colon cancer status post left hemicolectomy at [facility] in November 2010.   History of cirrhosis, diagnosed in 2009.   Anemia, baseline hemoglobin 11.   History of gastroparesis.  History of hepatitis C.  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  Cholelithiasis with chronic thickening of gallbladder.  History of alcoholism.  History of inguinal hernia repair.  History of tuberculosis, treated for 6 months back in 1997.  History of  thrush.  History of esophageal stricture.   history of hiatal hernia.   Past Surgical History: Last updated: 01/23/2010 S/p hemicolectomy. S/p throat surgery for cancer. Hernia Surgery  Family History: Reviewed history from 08/02/2010 and no changes required. Mother has breast cancer and alzhemiers No FH of Colon Cancer:  Social History: Reviewed history from 01/23/2010 and no changes required. 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  Alcohol Use - none in 2 years Daily Caffeine Use Single, never married. No relationship currently.  Review of Systems       Negative as per HPI.   Physical Exam  General:  alert and well-developed.  alert and well-developed.   Head:  normocephalic and atraumatic.  normocephalic and atraumatic.   Eyes:  vision grossly intact, pupils equal, pupils round, and pupils reactive to light.  vision grossly intact, pupils equal, pupils round, and pupils reactive to light.   Ears:  R ear normal and L ear normal.  R ear normal and L ear normal.   Nose:  no external deformity and no nasal discharge.  no external deformity and no nasal discharge.   Mouth:  Has dentures, no candida today.  Neck:  supple, full ROM, and no masses.  supple, full ROM, and no masses.   Chest Wall:  no deformities.  no deformities.   Lungs:  normal respiratory effort, normal breath sounds, no crackles, and no wheezes.  normal respiratory effort, normal breath sounds, no crackles, and no wheezes.   Heart:  normal rate, regular rhythm, no murmur, no gallop, and no rub.  normal rate, regular rhythm, no murmur, no gallop, and no rub.   Abdomen:  soft, non-tender, normal bowel sounds, and no masses.  soft, non-tender, normal bowel sounds, and no masses.   Rectal:  Defered Genitalia:  Defered Prostate:  Defered Msk:  normal ROM and no joint tenderness.  normal ROM and no joint tenderness.   Pulses:  2+ DP/PT  and radial pulses bilaterally Extremities:  No edema Neurologic:  alert & oriented X3 and cranial nerves II-XII intact.    Skin:  No rashes Cervical Nodes:  No cervical lymphadanopathy Axillary Nodes:  No axillarylymphadanopathy   Impression & Recommendations:  Problem # 1:  CANDIDIASIS OF MOUTH (ICD-112.0) Candidiasis appears to have resolved upon examination today, and patient is no longer symptomatic.  Will DC nystatin swish and swallow and continue to monitor.  The etiology is unclear at this time and further testing will be warranted if patient continues to develop candidiasis.  Problem # 2:  DIABETES MELLITUS, TYPE II, CONTROLLED (ICD-250.00) Pt will being checking fasting BS  in AM and was asked to bring in meter at next visit. Will continue 20U lantus daily.  Last A1c was 6.9 on 8/23. Needs eye exam in next 3 months and was instructed to schedule this at his convenience.  His updated medication list for this problem includes:    Lantus 100 Unit/ml Soln (Insulin glargine) .Marland Kitchen... Take 20 units of lantus each night    Aspirin 81 Mg Tbec (Aspirin) .Marland Kitchen... Take 1 tablet by mouth once a day    Orders: Capillary Blood Glucose/CBG (25956)  Problem # 3:  Hx of ESSENTIAL HYPERTENSION, BENIGN (ICD-401.1) BP continues to be within goal.  Will continue to follow.   His updated medication list for this problem includes:    Propranolol Hcl 10 Mg Tabs (Propranolol hcl) .Marland Kitchen... Take 1 tablet by mouth three times a day (out for about a week)  Problem # 4:  GERD (ICD-530.81) No active GERD symptoms at this time. Will refill omeprazole today.   His updated medication list for this problem includes:    Omeprazole 40 Mg Cpdr (Omeprazole) .Marland Kitchen... Take one tablet daily for reflux.  Problem # 5:  ADENOCARCINOMA, COLON, CECUM, HX OF (ICD-V10.05) Colonoscopy scheduled for Sept 19th, 2011.  Will folow up results/recomendations.  Problem # 6:  CIRRHOSIS (ICD-571.5)  Will follow Dr. Lamar Sprinkles  recommendations.  Recent CT showed no signs of HCC.  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  Patient will have follow up with GI in about 6 months with EGD for variceal screening around Jan of next year.  Will have HCC screening with CT on Feb 17th of next year.   Complete Medication List: 1)  Lantus 100 Unit/ml Soln (Insulin glargine) .... Take 20 units of lantus each night 2)  Omeprazole 40 Mg Cpdr (Omeprazole) .... Take one tablet daily for reflux. 3)  Monoject Insulin Safety Syr 29g X 1/2" 1 Ml Misc (Insulin syringe-needle u-100) .... Use as directed. 4)  Folic Acid 800 Mcg Tabs (Folic acid) .... Take one by mouth once daily 5)  Lancets Misc (Lancets) .... Use to test blood glucose 3x daily 6)  Accu-chek Aviva Strp (Glucose blood) .... To test blood glucose 3x  daily 7)  Propranolol Hcl 10 Mg Tabs (Propranolol hcl) .... Take 1 tablet by mouth three times a day (out for about a week) 8)  Multivitamins Tabs (Multiple vitamin) .... Take one by mouth once daily 9)  Moviprep 100 Gm Solr (Peg-kcl-nacl-nasulf-na asc-c) .... As per prep instructions. 10)  Aspirin 81 Mg Tbec (Aspirin) .... Take 1 tablet by mouth once a day 11)  Nystatin 100000 Unit/ml Susp (Nystatin) .... Take 1 tea spoon by mouth, swish and swallow four times a day for 1 week.  Patient Instructions: 1)  Please check your fasting blood sugar in the mornings and bring your monitor with you to your next visit in 3 months.  Prescriptions: PROPRANOLOL HCL 10 MG TABS (PROPRANOLOL HCL) Take 1 tablet by mouth three times a day (out for about a week)  #90 x 3   Entered and Authorized by:   Sinda Du MD   Signed by:   Sinda Du MD on 08/30/2010   Method used:   Electronically to        [facility] (retail)       [address]       [place], [place]  [address]       Ph: [phone]       Fax: [phone]   RxID:  [id number] LANCETS   MISC (LANCETS) use to test blood glucose 3x daily  #100 x  11   Entered and Authorized by:   Sinda Du MD   Signed by:   Sinda Du MD on 08/30/2010   Method used:   Electronically to        [facility] (retail)       [address]       [place], [place]  [address]       Ph: [phone]       Fax: [phone]   RxID:   [id number] MONOJECT INSULIN SAFETY SYR 29G X 1/2" 1 ML MISC (INSULIN SYRINGE-NEEDLE U-100) Use as directed.  #100 x prn   Entered and Authorized by:   Sinda Du MD   Signed by:   Sinda Du MD on 08/30/2010   Method used:   Electronically to        [facility] (retail)       [address]       [place], [place]  [address]       Ph: [phone]       Fax: [phone]   RxID:   [id number] OMEPRAZOLE 40 MG CPDR (OMEPRAZOLE) Take one tablet daily for reflux.  #32 x 6   Entered and Authorized by:   Sinda Du MD   Signed by:   Sinda Du MD on 08/30/2010   Method used:   Electronically to        [facility] (retail)       [address]       [place], [place]  [address]       Ph: [phone]       Fax: [phone]   RxID:   [id number] PROPRANOLOL HCL 10 MG TABS (PROPRANOLOL HCL) Take 1 tablet by mouth three times a day (out for about a week)  #90 x 3   Entered and Authorized by:   Sinda Du MD   Signed by:   Sinda Du MD on 08/30/2010   Method used:   Electronically to        [facility] (retail)       [address]       [place], [place]  [address]       Ph: [phone]       Fax: [phone]   RxID:   [id number] LANCETS   MISC (LANCETS) use to test blood glucose 3x daily  #100 x 11   Entered and Authorized by:   Sinda Du MD   Signed by:   Sinda Du MD on 08/30/2010   Method used:   Electronically to        [facility] (retail)       [address]       [place], [place]  [address]       Ph: [phone]       Fax:  [phone]   RxID:   [id number] MONOJECT INSULIN SAFETY SYR 29G X 1/2" 1 ML MISC (INSULIN SYRINGE-NEEDLE U-100) Use as directed.  #100 x prn   Entered and Authorized by:   Sinda Du MD   Signed by:   Sinda Du MD on 08/30/2010   Method used:   Electronically to        [facility] (retail)       [address]       [place], [place]  [address]  Ph: [phone]       Fax: [phone]   RxID:   [id number] OMEPRAZOLE 40 MG CPDR (OMEPRAZOLE) Take one tablet daily for reflux.  #32 x 6   Entered and Authorized by:   Sinda Du MD   Signed by:   Sinda Du MD on 08/30/2010   Method used:   Electronically to        [facility] (retail)       [address]       [place], [place]  [id number]       Ph: [phone]       Fax: [phone]   RxID:   [id number] LANTUS 100 UNIT/ML  SOLN (INSULIN GLARGINE) Take 20 units of lantus each night  #2 10ml vials x 0   Entered and Authorized by:   Sinda Du MD   Signed by:   Sinda Du MD on 08/30/2010   Method used:   Electronically to        [facility] (retail)       [address]       [place], [place]  [address]       Ph: [phone]       Fax: [phone]   RxID:   [id number]   Prevention & Chronic Care Immunizations   Influenza vaccine: Fluvax Non-MCR  (10/23/2007)   Influenza vaccine deferral: Not indicated  (01/23/2010)    Tetanus booster: Not documented   Td booster deferral: Not indicated  (11/02/2009)    Pneumococcal vaccine: Pneumovax  (08/02/2010)   Pneumococcal vaccine deferral: Not indicated  (01/23/2010)    H. zoster vaccine: Not documented   H. zoster vaccine deferral: Not indicated  (11/02/2009)  Colorectal Screening   Hemoccult: Not documented   Hemoccult action/deferral: Not indicated  (11/02/2009)    Colonoscopy: Location:  [facility].    (09/12/2009)  Other Screening   PSA: Not  documented   PSA action/deferral: Not indicated  (11/02/2009)   Smoking status: never  (08/30/2010)  Diabetes Mellitus   HgbA1C: 6.9  (08/22/2010)    Eye exam: Not documented   Diabetic eye exam action/deferral: Not indicated  (01/23/2010)   Eye exam due: 03/31/2010    Foot exam: yes  (11/02/2009)   High risk foot: Not documented   Foot care education: Not documented   Foot exam due: 11/02/2010    Urine microalbumin/creatinine ratio: 11.4  (01/23/2010)   Urine microalbumin action/deferral: Ordered  Lipids   Total Cholesterol: 116  (01/05/2010)   LDL: 60  (01/05/2010)   LDL Direct: Not documented   HDL: 43  (01/05/2010)   Triglycerides: 66  (01/05/2010)   Lipid panel due: 02/06/2009  Hypertension   Last Blood Pressure: 125 / 78  (08/30/2010)   Serum creatinine: 1.1  (08/02/2010)   Serum potassium 5.2  (07/11/2009)  Self-Management Support :   Personal Goals (by the next clinic visit) :     Personal A1C goal: 7  (01/23/2010)     Personal blood pressure goal: 130/80  (01/23/2010)     Personal LDL goal: 100  (01/23/2010)    Patient will work on the following items until the next clinic visit to reach self-care goals:     Medications and monitoring: take my medicines every day, check my blood sugar, bring all of my medications to every visit, examine my feet every day  (08/30/2010)     Eating: drink diet soda or water instead of juice or soda, eat more vegetables,  use fresh or frozen vegetables, eat foods that are low in salt, eat baked foods instead of fried foods, eat fruit for snacks and desserts, limit or avoid alcohol  (08/30/2010)     Activity: take a 30 minute walk every day, take the stairs instead of the elevator, park at the far end of the parking lot  (08/30/2010)     Other: trying to gain weight so eats "what I can get or feel like eating - do avoid sweets" - does not want to start any exercise - does walk up and down 3 flights of stairs where he lives - did not  bring meter but willing to do so next visit  (01/23/2010)    Diabetes self-management support: Written self-care plan, Education handout, Pre-printed educational material, Resources for patients handout  (08/30/2010)   Diabetes care plan printed   Diabetes education handout printed    Hypertension self-management support: Written self-care plan, Education handout, Pre-printed educational material, Resources for patients handout  (08/30/2010)   Hypertension self-care plan printed.   Hypertension education handout printed      Resource handout printed.     Vital Signs:  Patient profile:   70 year old male Height:      72 inches (182.88 cm) Weight:      178.06 pounds (80.94 kg) BMI:     24.24 Temp:     98.4 degrees F (36.89 degrees C) oral Pulse rate:   73 / minute BP sitting:   125 / 78  (left arm)  Vitals Entered By: Angelina Ok RN (August 30, 2010 2:58 PM)   Appended Document: RA/F/U VISIT PER TOBBIA/[facility] I saw and examined Mr Dermody and discussed him with Dr Cathey Endow. I agree with his note, assessment, and plan. No thrush apparent on exam today. No clear reason why he got this. He follows up with specialists for his cirrhosis and cancers.

## 2011-02-01 NOTE — Assessment & Plan Note (Signed)
Summary: abd distention x 2 months/pcp-bowen/hla   Vital Signs:  Patient profile:   70 year old male Height:      72 inches (182.88 cm) Weight:      181.2 pounds (82.36 kg) BMI:     24.66 Temp:     98.2 degrees F (36.78 degrees C) oral Pulse rate:   67 / minute BP sitting:   121 / 71  (left arm) Cuff size:   regular  Vitals Entered By: Cynda Familia Duncan Dull) (October 06, 2010 1:33 PM) CC: pt c/o abd distention >49mth, no pain  Is Patient Diabetic? Yes Did you bring your meter with you today? Yes Pain Assessment Patient in pain? no      Nutritional Status BMI of 19 -24 = normal  Have you ever been in a relationship where you Jetta Murray threatened, hurt or afraid?No   Does patient need assistance? Functional Status Self care Ambulation Normal   Primary Care Provider:  Elby Showers, MD  CC:  pt c/o abd distention >51mth and no pain .  History of Present Illness: 70 yr old man with pmhx as described below comes to the clinic complaining abdominal distention which he has noticed since the last year but gotten worse over the last 2 months.  Patient has normal bowel movements. Denies fever, chills, abdominal pain, diarrhea, melena, hematochezia.   Reports that he has a Colonoscopy scheduled for next month on November 3 because he missed the last one on September.  Reports that throat pain has resolved since taking nystatin.  Has not scheduled his Diabetic Eye exam. He does not check his sugars. Denies tremors,confusion, dizzines.   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) 4)  Abdominal Pain, Periumbilic  (ICD-789.05) 5)  Malignant Neoplasm of Transverse Colon  (ICD-153.1) 6)  Gerd  (ICD-530.81) 7)  Special  Screening For Malignant Neoplasms Colon  (ICD-V76.51) 8)  Hypoglycemia  (ICD-251.2) 9)  Adenocarcinoma, Prostate  (ICD-185) 10)  Thrush  (ICD-112.0) 11)  Hyponatremia  (ICD-276.1) 12)  Hyperkalemia  (ICD-276.7) 13)  Poisoning Unspecified Drug/medicinal Substance  (ICD-977.9) 14)  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 Take 20 Units of Lantus Each Night 2)  Omeprazole 40 Mg Cpdr (Omeprazole) .... Take One Tablet Daily For Reflux. 3)  Monoject Insulin Safety Syr 29g X 1/2" 1 Ml Misc (Insulin Syringe-Needle U-100) .... Use As Directed. 4)  Folic Acid 800 Mcg Tabs (Folic Acid) .... Take One By Mouth Once Daily 5)  Lancets   Misc (Lancets) .... Use To Test Blood Glucose 3x Daily 6)  Accu-Chek Aviva  Strp (Glucose Blood) .... To Test Blood Glucose 3x  Daily 7)  Propranolol Hcl 10 Mg Tabs (Propranolol Hcl) .... Take 1 Tablet By Mouth Three Times A Day (Out For About A  Week) 8)  Multivitamins  Tabs (Multiple Vitamin) .... Take One By Mouth Once Daily 9)  Aspirin 81 Mg Tbec (Aspirin) .... Take 1 Tablet By Mouth Once A Day 10)  Nystatin 100000 Unit/ml Susp (Nystatin) .... Take 1 Tea Spoon By Mouth, Swish and Swallow Four Times A Day For 1 Week. 11)  Moviprep 100 Gm  Solr (Peg-Kcl-Nacl-Nasulf-Na Asc-C) .... As Per Prep Instructions.  Current Medications (verified): 1)  Lantus 100 Unit/ml  Soln (Insulin Glargine) .... Take 20 Units of Lantus Each Night 2)  Omeprazole 40 Mg Cpdr (Omeprazole) .... Take One Tablet Daily For Reflux. 3)  Monoject Insulin  Safety Syr 29g X 1/2" 1 Ml Misc (Insulin Syringe-Needle U-100) .... Use As Directed. 4)  Folic Acid 800 Mcg Tabs (Folic Acid) .... Take One By Mouth Once Daily 5)  Lancets   Misc (Lancets) .... Use To Test Blood Glucose 3x Daily 6)  Accu-Chek Aviva  Strp (Glucose Blood) .... To Test Blood Glucose 3x  Daily 7)  Propranolol Hcl 10 Mg Tabs (Propranolol Hcl) .... Take 1 Tablet By Mouth Three Times A Day (Out For About A Week) 8)  Multivitamins  Tabs (Multiple Vitamin) .... Take One By Mouth Once Daily 9)  Aspirin 81 Mg Tbec (Aspirin) .... Take 1 Tablet By Mouth Once A Day 10)  Nystatin 100000 Unit/ml Susp (Nystatin) .... Take 1 Tea Spoon By Mouth, Swish and Swallow Four Times A Day For 1 Week. 11)  Moviprep 100 Gm  Solr (Peg-Kcl-Nacl-Nasulf-Na Asc-C) .... As Per Prep Instructions.  Allergies: No Known Drug Allergies  Past History:  Past Medical History: Last updated: 05/01/2010   Hospitalized for hematemesis in Jan. 2011 - negative EGD. On two times a day PPI now.    History of thrombocytopenia.   Diabetes.  Hypertension.   History of tonsillar cancer.   History of colon cancer status post left hemicolectomy at [facility] in November 2010.   History of cirrhosis, diagnosed in 2009.   Anemia, baseline hemoglobin 11.   History of gastroparesis.  History of hepatitis C.  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  Cholelithiasis with chronic thickening of gallbladder.  History of alcoholism.  History of inguinal hernia repair.  History of tuberculosis, treated for 6 months back in 1997.  History of thrush.  History of esophageal stricture.   history of hiatal hernia.   Past Surgical History: Last updated: 01/23/2010 S/p hemicolectomy. S/p throat surgery for cancer. Hernia Surgery  Family History: Last updated: 08/02/2010 Mother has breast cancer and alzhemiers No FH of Colon Cancer:  Social History: Last updated: 01/23/2010 Alcohol use-yes, history if abuse (none since  2008) Retired Warden/ranger - played piano Patient has never smoked.  Alcohol Use - none in 2 years Daily Caffeine Use Single, never married. No relationship currently.  Risk Factors: Alcohol Use: 0 (10/06/2010) Exercise: no (08/22/2010)  Risk Factors: Smoking Status: never (10/06/2010)  Family History: Reviewed history from 08/02/2010 and no changes required. Mother has breast cancer and alzhemiers No FH of Colon Cancer:  Social History: Reviewed history from 01/23/2010 and no changes required. 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  Alcohol Use - none in 2 years Daily Caffeine Use Single, never married. No relationship currently.  Review of Systems  The patient denies fever, chest pain, dyspnea on exertion, peripheral edema, hemoptysis, abdominal pain, melena, hematochezia, hematuria, muscle weakness, difficulty walking, and unusual weight change.    Physical Exam  General:  NAD Mouth:  MMM Lungs:  normal respiratory effort, normal breath sounds, no crackles, and no wheezes.  normal respiratory effort, normal breath sounds, no crackles, and no wheezes.   Heart:  normal rate, regular rhythm, no murmur, no gallop, and no rub.  normal rate, regular rhythm, no murmur, no gallop, and no rub.   Abdomen:  abdominal hernia noted>easily reducable Extremities:  No edema Neurologic:  Nonfocal  Diabetes Management Exam:    Foot Exam (with socks and/or shoes not present):       Sensory-Monofilament:          Left foot: normal          Right foot: normal   Impression & Recommendations:  Problem # 1:  HERNIA, VENTRAL (ICD-553.20) Patient needs repair on ventral hernia. Will referr to surgery and follow up.  Orders: Surgical Referral (Surgery)  Problem # 2:  ADENOCARCINOMA, COLON, CECUM, HX OF (ICD-V10.05) Patient has a repeat Colonoscopy for next month. Will get records.   Problem # 3:  Hx of  ESSENTIAL HYPERTENSION, BENIGN (ICD-401.1) Controlled. Continue current regimen.  His updated medication list for this problem includes:    Propranolol Hcl 10 Mg Tabs (Propranolol hcl) .Marland Kitchen... Take 1 tablet by mouth three times a day (out for about a week)  BP today: 121/71 Prior BP: 125/78 (08/30/2010)  Labs Reviewed: K+: 5.2 (07/11/2009) Creat: : 1.1 (08/02/2010)   Chol: 116 (01/05/2010)   HDL: 43 (01/05/2010)   LDL: 60 (01/05/2010)   TG: 66 (01/05/2010)  Problem # 4:  DIABETES MELLITUS, TYPE II, CONTROLLED (ICD-250.00) At goal. Will order Diabetic eye exam. Foot exam done today. Continue current regimen.  His updated medication list for this problem includes:    Lantus 100 Unit/ml Soln (Insulin glargine) .Marland Kitchen... Take 20 units of lantus each night    Aspirin 81 Mg Tbec (Aspirin) .Marland Kitchen... Take 1 tablet by mouth once a day  Orders: Ophthalmology Referral (Ophthalmology) T-CMP with Estimated GFR ([id number]) T-CBC No Diff ([id number])  Labs Reviewed: Creat: 1.1 (08/02/2010)    Reviewed HgBA1c results: 6.9 (08/22/2010)  6.1 (05/01/2010)  Complete Medication List: 1)  Lantus 100 Unit/ml Soln (Insulin glargine) .... Take 20 units of lantus each night 2)  Omeprazole 40 Mg Cpdr (Omeprazole) .... Take one tablet daily for reflux. 3)  Monoject Insulin Safety Syr 29g X 1/2" 1 Ml Misc (Insulin syringe-needle u-100) .... Use as directed. 4)  Folic Acid 800 Mcg Tabs (Folic acid) .... Take one by mouth once daily 5)  Lancets Misc (Lancets) .... Use to test blood glucose 3x daily 6)  Accu-chek Aviva Strp (Glucose blood) .... To test blood glucose 3x  daily 7)  Propranolol Hcl 10 Mg Tabs (Propranolol hcl) .... Take 1 tablet by mouth three times a day (out for about a week) 8)  Multivitamins Tabs (Multiple vitamin) .... Take one by mouth once daily 9)  Aspirin 81 Mg Tbec (Aspirin) .... Take 1 tablet by mouth once a day 10)  Nystatin 100000 Unit/ml Susp (Nystatin) .... Take 1 tea spoon by mouth,  swish and swallow four times a day for 1 week. 11)  Moviprep 100 Gm Solr (Peg-kcl-nacl-nasulf-na asc-c) .... As per prep instructions.  Patient Instructions: 1)  Please schedule a follow-up appointment in 1 month. 2)  Take all medication as directed. 3)  You will be called with any abnormalities in the tests scheduled or performed today.  If you don't hear from [place] within a week from when the test was performed,  you can assume that your test was normal.  Prescriptions: PROPRANOLOL HCL 10 MG TABS (PROPRANOLOL HCL) Take 1 tablet by mouth three times a day (out for about a week)  #90 x 3   Entered and Authorized by:   Laren Everts MD   Signed by:   Laren Everts MD on 10/06/2010   Method used:   Electronically to        [facility] (retail)       [address]       [place], [place]  [id number]       Ph: [phone]       Fax: [phone]   RxID:   [id number] FOLIC ACID 800 MCG TABS (FOLIC ACID) take one by mouth once daily  #30 x 3   Entered and Authorized by:   Laren Everts MD   Signed by:   Laren Everts MD on 10/06/2010   Method used:   Electronically to        [facility] (retail)       [address]       [place], [place]  [id number]       Ph: [phone]       Fax: [phone]   RxID:   [id number]   Prevention & Chronic Care Immunizations   Influenza vaccine: Fluvax Non-MCR  (10/23/2007)   Influenza vaccine deferral: Not indicated  (01/23/2010)    Tetanus booster: Not documented   Td booster deferral: Not indicated  (11/02/2009)    Pneumococcal vaccine: Pneumovax  (08/02/2010)   Pneumococcal vaccine deferral: Not indicated  (01/23/2010)    H. zoster vaccine: Not documented   H. zoster vaccine deferral: Not indicated  (11/02/2009)  Colorectal Screening   Hemoccult: Not documented   Hemoccult action/deferral: Not indicated  (11/02/2009)    Colonoscopy: Location:   [facility].    (09/12/2009)  Other Screening   PSA: Not documented   PSA action/deferral: Not indicated  (11/02/2009)   Smoking status: never  (10/06/2010)  Diabetes Mellitus   HgbA1C: 6.9  (08/22/2010)    Eye exam: Not documented   Diabetic eye exam action/deferral: Not indicated  (01/23/2010)   Eye exam due: 03/31/2010    Foot exam: yes  (10/06/2010)   Foot exam action/deferral: Do today   High risk foot: No  (10/06/2010)   Foot care education: Done  (10/06/2010)   Foot exam due: 11/02/2010    Urine microalbumin/creatinine ratio: 11.4  (01/23/2010)   Urine microalbumin action/deferral: Ordered    Diabetes flowsheet reviewed?: Yes   Progress toward A1C goal: At goal  Lipids   Total Cholesterol: 116  (01/05/2010)   LDL: 60  (01/05/2010)   LDL Direct: Not documented   HDL: 43  (01/05/2010)   Triglycerides: 66  (01/05/2010)   Lipid panel due: 02/06/2009  Hypertension   Last Blood Pressure: 121 / 71  (10/06/2010)   Serum creatinine: 1.1  (08/02/2010)   Serum potassium 5.2  (07/11/2009)    Hypertension flowsheet reviewed?: Yes   Progress toward BP goal: At goal  Self-Management Support :   Personal Goals (by the next clinic visit) :     Personal A1C goal: 7  (01/23/2010)     Personal blood pressure goal: 130/80  (01/23/2010)     Personal LDL goal: 100  (01/23/2010)    Patient will work on the following items until the next clinic visit to reach self-care goals:     Medications and monitoring: take my  medicines every day, check my blood sugar  (10/06/2010)     Eating: eat foods that are low in salt, eat baked foods instead of fried foods  (10/06/2010)     Activity: take a 30 minute walk every day, take the stairs instead of the elevator, park at the far end of the parking lot  (08/30/2010)     Other: trying to gain weight so eats "what I can get or feel like eating - do avoid sweets" - does not want to start any exercise - does walk up and down 3 flights of  stairs where he lives - did not bring meter but willing to do so next visit  (01/23/2010)    Diabetes self-management support: Written self-care plan  (10/06/2010)   Diabetes care plan printed    Hypertension self-management support: Written self-care plan  (10/06/2010)   Hypertension self-care plan printed.   Nursing Instructions: Diabetic foot exam today    Diabetic Foot Exam Last Podiatry Exam Date: 10/23/2007 Foot Inspection Is there a history of a foot ulcer?              No Is there a foot ulcer now?              No Can the patient see the bottom of their feet?          Yes Are the shoes appropriate in style and fit?          Yes Is there swelling or an abnormal foot shape?          No Are the toenails long?                No Are the toenails thick?                Yes Are the toenails ingrown?              Yes Is there heavy callous build-up?              No Is there pain in the calf muscle (Intermittent claudication) when walking?    NoIs there a claw toe deformity?              No Is there elevated skin temperature?            No Is there limited ankle dorsiflexion?            No Is there foot or ankle muscle weakness?            No  Diabetic Foot Care Education Patient educated on appropriate care of diabetic feet.  Pulse Check          Right Foot          Left Foot Posterior Tibial:        normal            normal Dorsalis Pedis:        normal            normal  High Risk Feet? No   10-g (5.07) Semmes-Weinstein Monofilament Test Performed by: Lynn Ito          Right Foot          Left Foot Test Control      normal         normal Site 1         normal         normal Site 4  normal         normal Site 5         normal         normal Site 6         normal         normal  Impression      normal         normal  Process Orders Check Orders Results:     [facility]: Check successful Tests Sent for requisitioning (October 09, 2010  10:33 AM):     10/06/2010: [facility] -- T-CMP with Estimated GFR [[id number]] (signed)     10/06/2010: [facility] -- T-CBC No Diff [[id number]] (signed)

## 2011-02-01 NOTE — Miscellaneous (Signed)
Summary: hosp admission  INTERNAL MEDICINE ADMISSION HISTORY AND PHYSICAL PCP: Dr. Clent Ridges  Attending: Dr. Coralee Pesa 1st contact: Dr. Logan Bores [phone] 2nd conctact: Dr. Sherryll Burger [phone]  After 5pm and weekends: 1st contact: [phone] 2nd contact: [phone]  CC: Vomiting blood  HPI: [name] is a 70 yo gentleman with PMH significant for colon cancer s/p hemicolectomy in 11/10, prostate cancer s/p external beam rad in 2010, SCC of throat s/p chemorad in 2009, Hep C, and cirrhosis 2/2 Hep C/prior alcohol abuse who presents for vomiting blood. He says that last week, he vomited "about a pint" of dark, "coffee-like" blood for three nights straight, with blood present starting with the 1st emesis. He went several days without vomiting until last night, when he vomited again with emesis containing blood. He also reports black stool yesterday. He decided to come to the ED today for further evaluation. He was seen previously for this issue on 12/28/09 at [facility] [place] and was given a prescription, the name of which he cannot remember.  [name] notes that he has not felt well since his surgery in November; he says he feels "very tired" and has not had an appetite for the past two months, basically only drinking Ensure and Lucerna. He denies fevers, diarrhea, abd pain, feeling dizzy or faint upon standing, cough, or any other symptom. He has a history of hematemesis "many years ago", but can't recall ever having had an EGD. 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 He denies using Ibuprofen or other NSAIDs.   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 It is well-compensated, however, with a calculated Child's score = 5 (lowest possible) and MELD = 8-9 per [facility]. 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  Alcohol Use - none in 3 years Daily Caffeine Use   FAMILY HISTORY Mother has breast cancer and alzhemiers  ROS: as per HPI  VITALS: T: 97.3  P: 81 BP: 114/72  R: 15 O2SAT: 100%  ON: RA Orthostatic vitals:  116/ 70, 74 lying down 107/68, 86 sitting 109/67, 96 standing  PHYSICAL EXAM: General:  alert, cachectic, very pleasant Head:  normocephalic and atraumatic.   Eyes:  vision grossly intact, pupils equal, pupils round, pupils reactive to light, no injection and anicteric.   Mouth:  dry mucous membranes   Lungs:  normal respiratory effort, no accessory muscle use, normal breath sounds, no crackles, and no wheezes. Heart: regular rate, regular rhythm, no murmur, no gallop, and no rub.   Abdomen: hyperactive bowel  sounds, soft, non-tender, no distention, no guarding, no rebound tenderness, no hepatomegaly, and no splenomegaly palpated.   Msk:  no joint swelling, no joint warmth, and no redness over joints.   Pulses:  2+ DP/PT pulses bilaterally Extremities:  No cyanosis, clubbing, edema Neurologic:  alert & oriented X3, nonfocal exam Skin:  no rashes.   Psych:  Oriented X3, memory intact for recent and remote, normally interactive, good eye contact, not anxious appearing, and not depressed appearing.  LABS:  Sodium (NA)                              135               135-145          mEq/L  Potassium (K)                            4.5               3.5-5.1          mEq/L  Chloride                                 107               96-112           mEq/L  CO2                                      25                19-32            mEq/L  Glucose                                  94                70-99            mg/dL  BUN                                      11                6-23             mg/dL  Creatinine                               0.75              0.4-1.5          mg/dL  GFR, Est Non African American            >60               >60              mL/min  GFR, Est African American                >60               >60  mL/min    Oversized comment, see footnote  1  Bilirubin, Total                         0.4               0.3-1.2          mg/dL  Alkaline Phosphatase                     67                39-117           U/L  SGOT (AST)                               45         h      0-37             U/L  SGPT (ALT)                               35                0-53             U/L  Total  Protein                           5.9        l      6.0-8.3          g/dL  Albumin-Blood                            2.7        l      3.5-5.2          g/dL  Calcium                                  8.6               8.4-10.5         mg/dL  WBC                                      3.9        l      4.0-10.5         K/uL  RBC                                      2.75       l      4.22-5.81        MIL/uL  Hemoglobin (HGB)                         8.3        l      13.0-17.0        g/dL    DELTA CHECK NOTED    REPEATED TO VERIFY  Hematocrit (HCT)  25.2       l      39.0-52.0        %  MCV                                       91.6              78.0-100.0       fL  MCHC                                     32.9              30.0-36.0        g/dL  RDW                                      22.3       h      11.5-15.5        %  Platelet Count (PLT)                     102        l      150-400          K/uL  Neutrophils, %                           54                43-77            %  Lymphocytes, %                           27                12-46            %  Monocytes, %                             15         h      3-12             %  Eosinophils, %                           4                 0-5              %  Basophils, %                             0                 0-1              %  Neutrophils, Absolute                    2.0               1.7-7.7  K/uL  Lymphocytes, Absolute                    1.1               0.7-4.0          K/uL  Monocytes, Absolute                      0.6               0.1-1.0          K/uL  Eosinophils, Absolute                    0.2               0.0-0.7          K/uL  Basophils, Absolute                      0.0               0.0-0.1          K/uL  RBC Morphology                           SEE NOTE.    ELLIPTOCYTES    FEW TEARDROP CELLS NOTED  Smear Review                             SEE NOTE.    PLATELETS APPEAR DECREASED  Lipase                                   25                11-59            U/L   Color, Urine                             YELLOW            YELLOW  Appearance                               HAZY       a      CLEAR  Specific Gravity                         1.021             1.005-1.030  pH                                       7.5               5.0-8.0  Urine Glucose                            NEGATIVE          NEG              mg/dL  Bilirubin  NEGATIVE          NEG  Ketones                                  NEGATIVE          NEG              mg/dL  Blood                                    NEGATIVE           NEG  Protein                                  NEGATIVE          NEG              mg/dL  Urobilinogen                             0.2               0.0-1.0          mg/dL  Nitrite                                  NEGATIVE          NEG  Leukocytes                               NEGATIVE          NEG  Occult Blood, Fecal                      POSITIVE  UDS: Negative  Acute Abdominal Series:  No acute chest findings. Postsurgical changes in the abdomen.  Nonspecific bowel gas pattern.    ASSESSMENT AND PLAN:  This is a 70 yo gentleman with Hep C and cirrhosis and s/p hemicolectomy for colon cancer 2 mos ago who presents with hematemesis x 1 wk, melena, and FTT x 2 mos. He does have intraabdominal varices present per CT scan done at [facility] but has never had an EGD.   1. Upper GI bleed- Likely etiology is intraabdominal varices rupture given his h/o varices, though other etiologies such as peptic ulcer disease are also possible. His Hgb is currently 8.3, which decreased from 10.5 on his initialy iStat. Will transfuse 2 units PRBCs and get repeat CBC in the AM. Although he is orthostatic by pulse, his vitals are stable and he is well-appearing, so we will admit him to a regular bed. An EGD will be necessary but at this point is not emergent, so we will consult GI in the AM. Given his decreased oral intake for the past several months, we will give D5NS x 12 hours. NPO for now given possible EGD in AM.  2. Thrombocytopenia- likely 2/2 his cirrhosis/Hep C. He has had thrombocytopenia for the past 2 years according to clinic records. Currently stable at  ~100. Will monitor.  3. DM- pt takes 20 units of lantus at bedtime, will decrease that to 10 units with SSI coverage.  4. HTN- given his low-normal blood pressure and likely low fluid status, will hold his propanolol for now.  5. PPx- SCDs

## 2011-02-01 NOTE — Miscellaneous (Signed)
Summary: Orders Update-referral [facility]/Oncology  Clinical Lists Changes  Orders: Added new Referral order of Oncology Referral (Oncology) - Signed

## 2011-02-01 NOTE — Miscellaneous (Signed)
  Clinical Lists Changes  Medications: Changed medication from FIRST-DUKES MOUTHWASH  SUSP (DIPHENHYD-HYDROCORT-NYSTATIN) 1 table spoon swish and swallow every 6 hours, take for one week. to NYSTATIN 100000 UNIT/ML SUSP (NYSTATIN) Take 1 spoon by mouth four times a day for 1 week. - Signed Rx of NYSTATIN 100000 UNIT/ML SUSP (NYSTATIN) Take 1 spoon by mouth four times a day for 1 week.;  #1 x 1;  Signed;  Entered by: Darnelle Maffucci MD;  Authorized by: Darnelle Maffucci MD;  Method used: Electronically to [facility] [address] [place], [place]  [id number], Ph: [phone], Fax: [phone]    Prescriptions: NYSTATIN 100000 UNIT/ML SUSP (NYSTATIN) Take 1 spoon by mouth four times a day for 1 week.  #1 x 1   Entered and Authorized by:   Darnelle Maffucci MD   Signed by:   Darnelle Maffucci MD on 08/22/2010   Method used:   Electronically to        [facility] (retail)       [address]       [place], [place]  [id number]       Ph: [phone]       Fax: [phone]   RxID:   [id number]

## 2011-02-01 NOTE — Letter (Signed)
Summary: General Surgery Note/[facility]  General Surgery Note/[facility]   Imported By: Sherian Rein 02/02/2010 08:06:45  _____________________________________________________________________  External Attachment:    Type:   Image     Comment:   External Document

## 2011-02-01 NOTE — Miscellaneous (Signed)
Summary: hospital A1C  Clinical Lists Changes  Observations: Added new observation of HGBA1C: 5.5 % (01/05/2010 6:20)

## 2011-02-03 ENCOUNTER — Other Ambulatory Visit: Payer: Self-pay | Admitting: *Deleted

## 2011-02-05 ENCOUNTER — Inpatient Hospital Stay (HOSPITAL_COMMUNITY)
Admission: AD | Admit: 2011-02-05 | Discharge: 2011-02-07 | DRG: 378 | Disposition: A | Discharge status: Home / Self Care | Payer: Medicare Other | Source: Ambulatory Visit | Attending: Internal Medicine | Admitting: Internal Medicine

## 2011-02-05 ENCOUNTER — Encounter: Payer: Self-pay | Admitting: Internal Medicine

## 2011-02-05 ENCOUNTER — Emergency Department (HOSPITAL_COMMUNITY)
Admission: EM | Admit: 2011-02-05 | Discharge: 2011-02-05 | Disposition: A | Discharge status: Other Acute Inpatient Hospital | Payer: Medicare Other | Source: Home / Self Care | Attending: Emergency Medicine | Admitting: Emergency Medicine

## 2011-02-05 ENCOUNTER — Emergency Department (HOSPITAL_COMMUNITY): Payer: Medicare Other

## 2011-02-05 DIAGNOSIS — K449 Diaphragmatic hernia without obstruction or gangrene: Secondary | ICD-10-CM | POA: Diagnosis present

## 2011-02-05 DIAGNOSIS — K319 Disease of stomach and duodenum, unspecified: Secondary | ICD-10-CM | POA: Diagnosis present

## 2011-02-05 DIAGNOSIS — I10 Essential (primary) hypertension: Secondary | ICD-10-CM | POA: Diagnosis present

## 2011-02-05 DIAGNOSIS — Z85038 Personal history of other malignant neoplasm of large intestine: Secondary | ICD-10-CM

## 2011-02-05 DIAGNOSIS — K766 Portal hypertension: Secondary | ICD-10-CM | POA: Diagnosis present

## 2011-02-05 DIAGNOSIS — D500 Iron deficiency anemia secondary to blood loss (chronic): Secondary | ICD-10-CM | POA: Diagnosis present

## 2011-02-05 DIAGNOSIS — K922 Gastrointestinal hemorrhage, unspecified: Secondary | ICD-10-CM | POA: Insufficient documentation

## 2011-02-05 DIAGNOSIS — K222 Esophageal obstruction: Secondary | ICD-10-CM | POA: Diagnosis present

## 2011-02-05 DIAGNOSIS — K7460 Unspecified cirrhosis of liver: Secondary | ICD-10-CM | POA: Insufficient documentation

## 2011-02-05 DIAGNOSIS — N200 Calculus of kidney: Secondary | ICD-10-CM | POA: Diagnosis present

## 2011-02-05 DIAGNOSIS — Z8546 Personal history of malignant neoplasm of prostate: Secondary | ICD-10-CM

## 2011-02-05 DIAGNOSIS — D62 Acute posthemorrhagic anemia: Secondary | ICD-10-CM | POA: Diagnosis present

## 2011-02-05 DIAGNOSIS — D649 Anemia, unspecified: Secondary | ICD-10-CM | POA: Insufficient documentation

## 2011-02-05 DIAGNOSIS — K209 Esophagitis, unspecified without bleeding: Secondary | ICD-10-CM | POA: Diagnosis present

## 2011-02-05 DIAGNOSIS — K920 Hematemesis: Secondary | ICD-10-CM | POA: Insufficient documentation

## 2011-02-05 DIAGNOSIS — K861 Other chronic pancreatitis: Secondary | ICD-10-CM | POA: Diagnosis present

## 2011-02-05 DIAGNOSIS — Z8619 Personal history of other infectious and parasitic diseases: Secondary | ICD-10-CM

## 2011-02-05 DIAGNOSIS — D696 Thrombocytopenia, unspecified: Secondary | ICD-10-CM | POA: Diagnosis present

## 2011-02-05 DIAGNOSIS — E119 Type 2 diabetes mellitus without complications: Secondary | ICD-10-CM | POA: Insufficient documentation

## 2011-02-05 DIAGNOSIS — K219 Gastro-esophageal reflux disease without esophagitis: Secondary | ICD-10-CM | POA: Diagnosis present

## 2011-02-05 LAB — URINALYSIS, ROUTINE W REFLEX MICROSCOPIC
Bilirubin Urine: NEGATIVE
Hgb urine dipstick: NEGATIVE
Ketones, ur: NEGATIVE mg/dL
Protein, ur: NEGATIVE mg/dL
Urine Glucose, Fasting: 500 mg/dL — AB

## 2011-02-05 LAB — TYPE AND SCREEN: ABO/RH(D): B POS

## 2011-02-05 LAB — DIFFERENTIAL
Basophils Relative: 0 % (ref 0–1)
Eosinophils Absolute: 0 10*3/uL (ref 0.0–0.7)
Lymphs Abs: 2.1 10*3/uL (ref 0.7–4.0)
Neutro Abs: 6.6 10*3/uL (ref 1.7–7.7)
Neutrophils Relative %: 69 % (ref 43–77)

## 2011-02-05 LAB — COMPREHENSIVE METABOLIC PANEL
ALT: 36 U/L (ref 0–53)
AST: 38 U/L — ABNORMAL HIGH (ref 0–37)
Albumin: 3.4 g/dL — ABNORMAL LOW (ref 3.5–5.2)
Alkaline Phosphatase: 57 U/L (ref 39–117)
CO2: 28 mEq/L (ref 19–32)
Chloride: 101 mEq/L (ref 96–112)
GFR calc Af Amer: >60 mL/min (ref >60)
Potassium: 4.5 mEq/L (ref 3.5–5.1)
Total Bilirubin: 1.2 mg/dL (ref 0.3–1.2)

## 2011-02-05 LAB — CBC
HCT: 28.2 % — ABNORMAL LOW (ref 39.0–52.0)
Hemoglobin: 10.5 g/dL — ABNORMAL LOW (ref 13.0–17.0)
Hemoglobin: 9.4 g/dL — ABNORMAL LOW (ref 13.0–17.0)
MCV: 94.8 fL (ref 78.0–100.0)
MCV: 95.6 fL (ref 78.0–100.0)
MCV: 95.3 fL (ref 78.0–100.0)
Platelets: 84 10*3/uL — ABNORMAL LOW (ref 150–400)
Platelets: 65 10*3/uL — ABNORMAL LOW (ref 150–400)
Platelets: 60 10*3/uL — ABNORMAL LOW (ref 150–400)
RBC: 3.27 MIL/uL — ABNORMAL LOW (ref 4.22–5.81)
RBC: 2.95 MIL/uL — ABNORMAL LOW (ref 4.22–5.81)
RDW: 14.5 % (ref 11.5–15.5)
WBC: 9.6 10*3/uL (ref 4.0–10.5)
WBC: 7.9 10*3/uL (ref 4.0–10.5)
WBC: 8.1 10*3/uL (ref 4.0–10.5)

## 2011-02-05 LAB — RAPID URINE DRUG SCREEN, HOSP PERFORMED
Benzodiazepines: NOT DETECTED
Cocaine: NOT DETECTED

## 2011-02-05 LAB — PROTIME-INR: Prothrombin Time: 15.7 seconds — ABNORMAL HIGH (ref 11.6–15.2)

## 2011-02-05 MED ORDER — PROPRANOLOL HCL 10 MG PO TABS: 10.0000 mg | ORAL_TABLET | Freq: Three times a day (TID) | ORAL | Status: DC

## 2011-02-06 ENCOUNTER — Encounter: Payer: Self-pay | Admitting: Internal Medicine

## 2011-02-06 DIAGNOSIS — K922 Gastrointestinal hemorrhage, unspecified: Secondary | ICD-10-CM

## 2011-02-06 DIAGNOSIS — K222 Esophageal obstruction: Secondary | ICD-10-CM

## 2011-02-06 DIAGNOSIS — K208 Other esophagitis: Secondary | ICD-10-CM

## 2011-02-06 DIAGNOSIS — K766 Portal hypertension: Secondary | ICD-10-CM

## 2011-02-06 LAB — GLUCOSE, CAPILLARY
Glucose-Capillary: 199 mg/dL — ABNORMAL HIGH (ref 70–99)
Glucose-Capillary: 220 mg/dL — ABNORMAL HIGH (ref 70–99)
Glucose-Capillary: 153 mg/dL — ABNORMAL HIGH (ref 70–99)

## 2011-02-06 LAB — CBC
Hemoglobin: 8.6 g/dL — ABNORMAL LOW (ref 13.0–17.0)
Hemoglobin: 8.5 g/dL — ABNORMAL LOW (ref 13.0–17.0)
MCHC: 34 g/dL (ref 30.0–36.0)
Platelets: 47 10*3/uL — ABNORMAL LOW (ref 150–400)
RBC: 2.69 MIL/uL — ABNORMAL LOW (ref 4.22–5.81)
WBC: 6.9 10*3/uL (ref 4.0–10.5)

## 2011-02-06 LAB — FOLATE: Folate: >20 ng/mL

## 2011-02-06 LAB — LIPID PANEL
LDL Cholesterol: 52 mg/dL (ref 0–99)
Triglycerides: 95 mg/dL (ref <150)

## 2011-02-06 LAB — AFP TUMOR MARKER: AFP-Tumor Marker: 13.2 ng/mL — ABNORMAL HIGH (ref 0.0–8.0)

## 2011-02-06 LAB — IRON AND TIBC: Iron: 83 ug/dL (ref 42–135)

## 2011-02-06 LAB — FERRITIN: Ferritin: 50 ng/mL (ref 22–322)

## 2011-02-06 LAB — BASIC METABOLIC PANEL
CO2: 23 mEq/L (ref 19–32)
Chloride: 110 mEq/L (ref 96–112)
GFR calc Af Amer: >60 mL/min (ref >60)
Potassium: 4.1 mEq/L (ref 3.5–5.1)
Sodium: 140 mEq/L (ref 135–145)

## 2011-02-06 LAB — HEMOGLOBIN A1C: Hgb A1c MFr Bld: 7.3 % — ABNORMAL HIGH (ref <5.7)

## 2011-02-07 DIAGNOSIS — K922 Gastrointestinal hemorrhage, unspecified: Secondary | ICD-10-CM

## 2011-02-07 LAB — BASIC METABOLIC PANEL
BUN: 25 mg/dL — ABNORMAL HIGH (ref 6–23)
Chloride: 112 mEq/L (ref 96–112)
GFR calc non Af Amer: >60 mL/min (ref >60)
Glucose, Bld: 142 mg/dL — ABNORMAL HIGH (ref 70–99)
Potassium: 4 mEq/L (ref 3.5–5.1)
Sodium: 139 mEq/L (ref 135–145)

## 2011-02-07 LAB — CBC
HCT: 24.5 % — ABNORMAL LOW (ref 39.0–52.0)
Hemoglobin: 8.3 g/dL — ABNORMAL LOW (ref 13.0–17.0)
MCV: 95.7 fL (ref 78.0–100.0)
RBC: 2.56 MIL/uL — ABNORMAL LOW (ref 4.22–5.81)
RDW: 15.3 % (ref 11.5–15.5)
WBC: 3.8 10*3/uL — ABNORMAL LOW (ref 4.0–10.5)

## 2011-02-07 LAB — GLUCOSE, CAPILLARY
Glucose-Capillary: 150 mg/dL — ABNORMAL HIGH (ref 70–99)
Glucose-Capillary: 154 mg/dL — ABNORMAL HIGH (ref 70–99)

## 2011-02-08 ENCOUNTER — Telehealth: Payer: Self-pay | Admitting: Ophthalmology

## 2011-02-08 DIAGNOSIS — A048 Other specified bacterial intestinal infections: Secondary | ICD-10-CM | POA: Insufficient documentation

## 2011-02-08 LAB — CROSSMATCH

## 2011-02-08 MED ORDER — CLARITHROMYCIN 500 MG PO TABS: 500.0000 mg | ORAL_TABLET | Freq: Two times a day (BID) | ORAL | Status: AC

## 2011-02-08 MED ORDER — ESOMEPRAZOLE MAGNESIUM 40 MG PO CPDR: 40.0000 mg | DELAYED_RELEASE_CAPSULE | Freq: Every day | ORAL | Status: AC

## 2011-02-08 MED ORDER — AMOXICILLIN 500 MG PO CAPS: 1000.0000 mg | ORAL_CAPSULE | Freq: Two times a day (BID) | ORAL | Status: AC

## 2011-02-08 NOTE — Assessment & Plan Note (Signed)
EGD was performed which demonstrated mild gastritis and biopsies were positive for H. Pylori. I will treat the patient with triple therapy for 10 days with esomeprazole, amoxicillin , andythromycin.

## 2011-02-08 NOTE — Telephone Encounter (Signed)
EGD was performed which demonstrated mild gastritis and biopsies were positive for H. Pylori. I will treat the patient with triple therapy for 10 days with esomeprazole, amoxicillin , andythromycin.  I contacted the patient  And counseled him regarding his diagnosis. Instructed the patient to pick up his antibiotic burden's pharmacy. The patient states that he does continue to have some mild abdominal pain but this has actually improved. I told the patient he would take his medications for 10 days.

## 2011-02-15 NOTE — Initial Assessments (Signed)
Summary: Hospital admission  INTERNAL MEDICINE ADMISSION HISTORY AND PHYSICAL  Attending: Dr. Doneen Poisson First contact: Ria Bush [phone] Second contact: Dr. Gilford Rile [phone] [facility], after-hours: [phone]  PCP: Dr. Cathey Endow  CC: Hematemesis  HPI: [name] is a 70yo male with a past medical history of cirrhosis, past alcohol abuse, hepatitis infection, throat cancer, colon cancer, prostate cancer, chronic pancreatitis, anemia and type 2 diabetes who presents with hematemasis. [name] states he ran out of his omeprazole 4 days ago and had difficulty obtaining it from the pharmacy. He began to have a decreased appetite 3 days ago and yesterday had some intermittant nausea, intermittant hiccups, and intermittant mild epigastric pain. Last night he threw up twice what he described as a "black thick substance" and the volume was described as a saucepan about 4-5 inches around each episode. This AM he had a bowel movement with black stools and presented to [facility]. While at [facility] he vomited once more and it was the same volume and consistency. He denies any fever, chills, hematochezia. He has felt "a little dizzy every once in a while" and complains of decreased energy over the past few days. He also staets that he stopped taking his propanolol and baby aspirin 2 days ago. He has continued his insulin however. [name] also states that he has noticed his abdomen has gotten bigger over the past few months.   ALLERGIES: NKDA  PAST MEDICAL HISTORY: Type 2 Diabetes, insulin dependent. Diagnosed 15 years ago. A1C 6.9 in 07/2010 Hospitalized for hematemesis in Jan. 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 No esophagel varices noted. Due for repeat endoscopy 12/2010. History of thrombocytopenia. Plt baseline about 100 Hypertension.  History of SCC tonsillar cancer. s/p chemorad 01/2008 History of colon cancer status post  left hemicolectomy at [facility] in November 2010.  History of cirrhosis, diagnosed in 2009. Followed by Dr. Marina Goodell.  Anemia, baseline hemoglobin 11.  History of gastroparesis.  History of hepatitis C and B. 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  Cholelithiasis with chronic thickening of gallbladder.  History of alcoholism.  History of inguinal hernia repair.  History of tuberculosis, treated for 6 months back in 1997.  History of thrush.  History of esophageal stricture.  history of hiatal hernia.    MEDICATIONS: LANTUS 100 UNIT/ML  SOLN (INSULIN GLARGINE) Take 20 units of lantus each night OMEPRAZOLE 40 MG CPDR (OMEPRAZOLE) Take one tablet daily for reflux. FOLIC ACID 1 MG TABS (FOLIC ACID) Take 1 tablet by mouth once a day LANCETS   MISC (LANCETS) use to test blood glucose 3x daily ACCU-CHEK AVIVA  STRP (GLUCOSE BLOOD) to test blood glucose 3x  daily [BMN] PROPRANOLOL HCL 10 MG TABS (PROPRANOLOL HCL) Take 1 tablet by mouth three times a day (out for about a week) MULTIVITAMINS  TABS (MULTIPLE VITAMIN) take one by mouth once daily ASPIRIN 81 MG TBEC (ASPIRIN) Take 1 tablet by mouth once a day ELITE-THIN INSULIN SYRINGE 31G X 5/16" 0.5 ML MISC (INSULIN SYRINGE-NEEDLE U-100) use as directed   SOCIAL HISTORY: Alcohol use-yes, history if abuse (none since 2008) Retired Warden/ranger - played piano Patient has never smoked.  No history of drug use.  Alcohol Use - none in 3 years Single, never married. No relationship currently.   FAMILY HISTORY Mother has breast cancer and alzhemiers No  FH of Colon Cancer:   ROS: Neg for cough, fever, chills, dysuria, hematuria, constipation, diarrhea, rash.  Pos increasing abdominal girth.  VITALS: T: 98.1    P: 89   BP:   137/84   R:18  O2SAT: 98% ON:RA  PHYSICAL EXAM: GEN: NAD, sitting comfortably in bed.  HEENT: PERRL, EOMI, no icterus, no pallor. OP clear.  NECK: supple, no  cervical LN LUNGS: CTAB CVS: RRR, distant heart sounds, no murmur/rubs/gallops ABD: Soft, non-tender, no HSM, normal bowel sounds. 3 small healed port scars.  EXTREMITIES: No edema or cyanosis. Pulses 2+ and equal in right radial, left pedal, and right pedal. Pulse 1+ in left radial.  NEURO: Alert Oriented x3. CN 2-12 intact. 5/5 strength throughout. Intact sensation to light touch throughout.  Skin: No rash.   LABS: 02/05/2011 10:55 AM CBC+Diff  WBC                                      9.6               4.0-10.5         K/uL  RBC                                      3.27       l      4.22-5.81        MIL/uL  Hemoglobin (HGB)                         10.5       l      13.0-17.0        g/dL  Hematocrit (HCT)                         31.0       l      39.0-52.0        %  MCV                                      94.8              78.0-100.0       fL  MCH -                                    32.1              26.0-34.0        pg  MCHC                                     33.9              30.0-36.0        g/dL  RDW                                      14.2  11.5-15.5        %  Platelet Count (PLT)                     84         l      150-400          K/uL  Neutrophils, %                           69                43-77            %  Lymphocytes, %                           22                12-46            %  Monocytes, %                             8                 3-12             %  Eosinophils, %                           0                 0-5              %  Basophils, %                             0                 0-1              %  Neutrophils, Absolute                    6.6               1.7-7.7          K/uL  Lymphocytes, Absolute                    2.1               0.7-4.0          K/uL  Monocytes, Absolute                      0.8               0.1-1.0          K/uL  Eosinophils, Absolute                    0.0               0.0-0.7          K/uL  Basophils,  Absolute                      0.0               0.0-0.1  K/uL   CMET  Sodium (NA)                              139               135-145          mEq/L  Potassium (K)                            4.5               3.5-5.1          mEq/L  Chloride                                 101               96-112           mEq/L  CO2                                      28                19-32            mEq/L  Glucose                                  292        h      70-99            mg/dL  BUN                                      52         h      6-23             mg/dL  Creatinine                               1.22              0.4-1.5          mg/dL  GFR, Est Non African American            59         l      >60              mL/min  GFR, Est African American                >60               >60              mL/min    Oversized comment, see footnote  1  Bilirubin, Total                         1.2               0.3-1.2          mg/dL  Alkaline Phosphatase  57                39-117           U/L  SGOT (AST)                               38         h      0-37             U/L  SGPT (ALT)                               36                0-53             U/L  Total  Protein                           6.8               6.0-8.3          g/dL  Albumin-Blood                            3.4        l      3.5-5.2          g/dL  Calcium                                  10.2              8.4-10.5         mg/dL   Lipase                                   21                11-59            U/L  Color, Urine                             YELLOW            YELLOW  Appearance                               CLEAR             CLEAR  Specific Gravity                         1.024             1.005-1.030  pH                                       6.0               5.0-8.0  Urine Glucose  500        a      NEG              mg/dL  Bilirubin                                 NEGATIVE          NEG  Ketones                                  NEGATIVE          NEG              mg/dL  Blood                                    NEGATIVE          NEG  Protein                                  NEGATIVE          NEG              mg/dL  Urobilinogen                             0.2               0.0-1.0          mg/dL  Nitrite                                  NEGATIVE          NEG  Leukocytes                               NEGATIVE          NEG    MICROSCOPIC NOT DONE ON URINES WITH NEGATIVE PROTEIN, BLOOD, LEUKOCYTES,    NITRITE, OR GLUCOSE <1000 mg/dL.  Protime ( Prothrombin Time)              15.7       h      11.6-15.2        seconds INR                                      1.23              0.00-1.49 PTT(a-Partial Thromboplastn Time)        25                24-37            seconds     ACUTE ABDOMEN SERIES (ABDOMEN 2 VIEW & CHEST 1 VIEW)    Comparison: 03/16/2008.    Findings: There are calcifications in the region of the tail the   pancreas.  There are  small left renal calculi.  There is a   probable small amount of radiopaque material within the stomach.  The bowel gas pattern is normal.  There is retained stool noted   within the right colon.  There is no evidence for free peritoneal   air.    The lungs are clear.  The heart and mediastinal structures are   normal.    IMPRESSION:    1.  Normal bowel gas pattern.   2.  Calcifications associated with the tail of the pancreas   consistent with chronic pancreatitis.   3.  Small left renal calculi.   4.  No acute findings.  ASSESSMENT AND PLAN: (1) Upper GI bleed: Patient's report of hematemasis and epsidoe of melena this AM are most consistent with upper GI bleed, particularly given his past history of upper GI bleeds and the fact that he has been without his omeprazole for 4 days and propanolol for 2 days. Last EGD showed AVM, esophageal erosions, and 1 polyp but no evidence for esophageal varices.  -  Protonix 40mg  IV two times a day - Propanolol 10mg  by mouth three times a day, hold if SBP <110 - NPO with NS 2mL/hr IV fluids.  - Given last EGD did not find varices, will not start on octreotide and antibiotic prophylaxis.  - We have called a GI Consult for possible EGD.   - Zofran 4mg  IV q4h as needed - Morphine 1mg  IV q6h as needed  (2) Cirrhosis: Past infection with hepatitis B and C and long history of alcohol abuse.  - Check AFP. Last done 03/26/2007 value of 8.5.  - Fasting lipid panel.   (3) HTN: - Restart home propanolol. Hold if SBP <110.    (4) Anemia: Patient has long history of anemia with baseline Hgb at 11. Last CBC Hgb 9.4 down from 10.5 this AM.  Patient received 1L in ED.  Patient appears to be currently stable and has not had an episode of hematemasis since this AM. - Recheck CBC at 22:00. If stable will check CBC at 5 AM. Then check q12.  - Send anemia panel.   (5) Diabetes:  - Check A1c - Hold home meds for now as patient is NPO - sliding scale insulin   VTE PROPH: SCD's   ATTENDING: I performed and/or observed a history and physical examination of the patient.  I discussed the case with the residents as noted and reviewed the residents' notes.  I agree with the findings and plan--please refer to the attending physician note for more details.  Signature________________________________  Printed Name_____________________________

## 2011-02-15 NOTE — Procedures (Signed)
Summary: EGD  ENDOSCOPY PROCEDURE REPORT  PATIENT:  Eddie [name], Eddie [name]  MR#:  [id number] BIRTHDATE:   [birth year], 69 yrs. old   GENDER:   male  ENDOSCOPIST:   Hedwig Morton. Juanda Chance, MD Referred by: Doneen Poisson, M.D.  PROCEDURE DATE:  02/06/2011 PROCEDURE:  EGD, diagnostic 43235 ASA CLASS:   Class III INDICATIONS: hematemesis, abnormal imaging, melena es.a dn gastric varices on CT scan, drifting H/H, EGD 2011- no varices,  hx of chronic liver disease  MEDICATIONS:    Versed 7 mg, Fentanyl 50 mcg TOPICAL ANESTHETIC:   Cetacaine Spray  DESCRIPTION OF PROCEDURE:   After the risks benefits and alternatives of the procedure were thoroughly explained, informed consent was obtained.  The Pentax Ped Colon [id number] endoscope was introduced through the mouth and advanced to the second portion of the duodenum, without limitations.  The instrument was slowly withdrawn as the mucosa was fully examined. <<PROCEDUREIMAGES>>                      <<OLD IMAGES>>  Esophagitis was found in the distal esophagus (see image001 and image002). distal 3 cm multiple linear and branching erosions  Grade I varices were found. 2 strains of es (see image014 and image015). varices  Portal Hypertensive gastropathy (see image010, image009, image008, and image007).  A hiatal hernia was found (see image011 and image012). 4 cm hiatal hernia  A stricture was found in the distal esophagus (see image012). non obstructing fibrous stricture at 35 cm  The duodenal bulb was normal in appearance, as was the postbulbar duodenum (see image006 and image005).    Retroflexed views revealed no abnormalities.    The scope was then withdrawn from the patient and the procedure completed.  COMPLICATIONS:   None  ENDOSCOPIC IMPRESSION:  1) Esophagitis in the distal esophagus  2) Grade I varices  3) Portal Hypertensive gastropathy  4) Hiatal hernia  5) Stricture in the distal esophagus  6) Normal duodenum  no active bleeding, likely  source was esophagitis and underlying portal hypertension, RECOMMENDATIONS:  see orders fpr plan of treatment  REPEAT EXAM:   In 0 year(s) for.   _______________________________ Hedwig Morton. Juanda Chance, MD    CC:

## 2011-02-16 ENCOUNTER — Other Ambulatory Visit: Payer: Self-pay | Admitting: Internal Medicine

## 2011-02-16 ENCOUNTER — Other Ambulatory Visit (HOSPITAL_COMMUNITY): Payer: Self-pay

## 2011-02-16 ENCOUNTER — Ambulatory Visit (HOSPITAL_COMMUNITY)
Admission: RE | Admit: 2011-02-16 | Discharge: 2011-02-16 | Disposition: A | Discharge status: Home / Self Care | Payer: Medicare Other | Source: Ambulatory Visit | Attending: Internal Medicine | Admitting: Internal Medicine

## 2011-02-16 ENCOUNTER — Encounter (HOSPITAL_BASED_OUTPATIENT_CLINIC_OR_DEPARTMENT_OTHER): Payer: Medicare Other | Admitting: Internal Medicine

## 2011-02-16 ENCOUNTER — Encounter (HOSPITAL_COMMUNITY): Payer: Self-pay

## 2011-02-16 ENCOUNTER — Encounter (INDEPENDENT_AMBULATORY_CARE_PROVIDER_SITE_OTHER): Payer: Self-pay | Admitting: *Deleted

## 2011-02-16 DIAGNOSIS — D696 Thrombocytopenia, unspecified: Secondary | ICD-10-CM

## 2011-02-16 DIAGNOSIS — C099 Malignant neoplasm of tonsil, unspecified: Secondary | ICD-10-CM

## 2011-02-16 DIAGNOSIS — C090 Malignant neoplasm of tonsillar fossa: Secondary | ICD-10-CM

## 2011-02-16 DIAGNOSIS — C61 Malignant neoplasm of prostate: Secondary | ICD-10-CM

## 2011-02-16 DIAGNOSIS — Z09 Encounter for follow-up examination after completed treatment for conditions other than malignant neoplasm: Secondary | ICD-10-CM | POA: Insufficient documentation

## 2011-02-16 DIAGNOSIS — M47812 Spondylosis without myelopathy or radiculopathy, cervical region: Secondary | ICD-10-CM | POA: Insufficient documentation

## 2011-02-16 DIAGNOSIS — Z85038 Personal history of other malignant neoplasm of large intestine: Secondary | ICD-10-CM | POA: Insufficient documentation

## 2011-02-16 DIAGNOSIS — I6529 Occlusion and stenosis of unspecified carotid artery: Secondary | ICD-10-CM | POA: Insufficient documentation

## 2011-02-16 HISTORY — DX: Malignant (primary) neoplasm, unspecified: C80.1

## 2011-02-16 LAB — CBC WITH DIFFERENTIAL/PLATELET
EOS%: 3.8 % (ref 0.0–7.0)
MCH: 32.2 pg (ref 27.2–33.4)
MCV: 96.3 fL (ref 79.3–98.0)
MONO%: 9.6 % (ref 0.0–14.0)
NEUT#: 2.5 10*3/uL (ref 1.5–6.5)
RBC: 2.6 10*6/uL — ABNORMAL LOW (ref 4.20–5.82)
RDW: 16.1 % — ABNORMAL HIGH (ref 11.0–14.6)
lymph#: 0.9 10*3/uL (ref 0.9–3.3)

## 2011-02-16 LAB — CMP (CANCER CENTER ONLY)
ALT(SGPT): 40 U/L (ref 10–47)
AST: 50 U/L — ABNORMAL HIGH (ref 11–38)
Albumin: 3.1 g/dL — ABNORMAL LOW (ref 3.3–5.5)
Alkaline Phosphatase: 82 U/L (ref 26–84)
Chloride: 104 mEq/L (ref 98–108)
Potassium: 4.2 mEq/L (ref 3.3–4.7)
Sodium: 140 mEq/L (ref 128–145)
Total Protein: 6.9 g/dL (ref 6.4–8.1)

## 2011-02-16 MED ORDER — IOHEXOL 300 MG/ML  SOLN
125.0000 mL | Freq: Once | INTRAMUSCULAR | Status: AC | PRN
  Administered 2011-02-16: 125 mL via INTRAVENOUS

## 2011-02-18 NOTE — Discharge Summary (Signed)
Eddie [name], Eddie [name]            ACCOUNT NO.:  [id number]  MEDICAL RECORD NO.:  [id number]           PATIENT TYPE:  I  LOCATION:  4709                         FACILITY:  [facility]  PHYSICIAN:  Doneen Poisson, MD     DATE OF BIRTH:  July 22, 1941  DATE OF ADMISSION:  02/05/2011 DATE OF DISCHARGE:  02/07/2011                              DISCHARGE SUMMARY   DISCHARGE DIAGNOSES: 1. Upper gastrointestinal bleed. 2. Hypertension. 3. Anemia. 4. Cirrhosis. 5. Type 2 diabetes.  DISCHARGE MEDICATIONS WITH ACCURATE DOSES: 1. Coreg 3.125 mg by mouth twice daily. 2. Omeprazole 40 mg twice daily.  This was increased from his previous     dose of omeprazole 40 mg once daily.  He will do a 3-month course     of twice daily and he will be switched back to once daily     omeprazole. 3. Aspirin 81 mg by mouth daily. 4. Folic acid 800 mcg 1 tablet by mouth daily. 5. Lantus 20 units daily at bedtime. 6. Multivitamins 1 tablet daily. 7. Pepto-Bismol 2-2.5 tablespoons every 4 hours as needed.  MEDICATIONS THAT HAVE BEEN DISCONTINUED:  Propanolol 10 mg 1 tablet t.i.d., this medication was stopped and Coreg was started.  Eddie [name] has esophageal varices, although has not had a bleed from his varices. Coreg can be used as primary prophylaxis in patients with esophageal varices and that will be his new medication for his hypertension.  DISPOSITION AND FOLLOWUP:  Eddie [name] will return to the Outpatient Clinic to see Dr. Sinda Du on February 19, 2011 at 2:45 p.m. Things to follow up at that time: 1. CBC to follow up his hemoglobin and platelets. 2. Blood pressure in the setting of new medication start. 3. Glucose. 4. Assess for continued melena or hematemesis.  PROCEDURES PERFORMED:  On February 06, 2011, Eddie [name] had an upper endoscopy.  On the endoscopy, they found: 1. Esophagitis in the distal esophagus. 2. Grade 1 varices. 3. Portal hypertensive gastropathy. 4. Hiatal hernia. 5.  Stricture in the distal esophagus. 6. Normal duodenum.  The overall  impression was that there was no active bleeding and the likely source of his hematemesis was esophagitis and underlying portal hypertension.  CONSULTATIONS:  GI.  BRIEF ADMITTING HISTORY AND PHYSICAL:  Eddie [name] is a 70 year old man with a past medical history that is complicated including cirrhosis, past severe alcohol abuse, hepatitis infection, throat cancer, colon cancer, prostate cancer, chronic pancreatitis, anemia, thrombocytopenia, and type 2 diabetes who presented with multiple episodes of hematemesis. Of note, Eddie [name] had multiple episodes of hematemesis about 1 year ago.  Eddie [name] stated that he ran out of his omeprazole 4 days prior to admission and had difficulty obtaining it from the Pharmacy.  He began to have a decreased appetite 3 days prior to admission and the day before admission he had some intermittent nausea, intermittent hiccups, and intermittent mild epigastric pain.  The night before admission, he threw up twice and he described the substance as a thick and black and the volume was described as a saucepan about 4-5 inches around and 1-2 inches deep.  He threw up that  volume twice the night before admission. The next morning, he had a bowel movement with black stools which is unusual for him and he presented to [facility].  While at [facility], he again vomited a thick black substance which was again the same volume. He denies any fevers, chills, or hematochezia.  He has felt a little dizzy every once in a while and complained of decreased energy over the days prior to admission.  He also stated that he stopped taking his propranolol and his baby aspirin 2 days prior to admission.  He has continued his nighttime dose of insulin, however.  Eddie [name] also stated that he thinks that his abdomen may have gotten larger over the past few months, although later he denied this  claim.  ALLERGIES:  No known allergies.  PAST MEDICAL HISTORY:  Significant for multiple problems including: 1. Type 2 diabetes and an A1c in August 2011 at 6.9. 2. Hospitalized for hematemesis in January 2011 with an EGD that     showed multiple erosions in the esophagus.  A 1-cm polyp in the     antrum of his stomach and a 2-mm AVM in the antrum.  At that time,     no esophageal varices were noted. 3. Thrombocytopenia with a platelet baseline around 100 that he has     had for at least 12 years. 4. Hypertension. 5. 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 6. History of colon cancer status post left hemicolectomy at [facility] in     November 2010. 7. History of cirrhosis diagnosed in 2009, currently followed by Dr.     Marina Goodell. 8. Anemia with a baseline hemoglobin of 11. 9. Gastroparesis. 10. Hepatitis C and Hepatitis B with serology last done on December 08, 2006. 11. 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 12. History of alcoholism, has been without an alcohol drink for over       3 years at this time. 13. Cholelithiasis with chronic thickening of the gallbladder. 14. History of tuberculosis, treated for 6 months since 1997. 15. History of thrush. 16. History of inguinal hernia. 17. History of hiatal hernia.  PHYSICAL EXAMINATION: VITAL SIGNS:  Temperature 98.1, pulse 89, blood pressure 137/84,  respirations 18, and O2 sats 98% on room air. GENERAL:  No acute distress, sitting comfortably in bed. HEENT:  Pupils equal, round, and reactive to light.  Extraocular movements intact.  No icterus or pallor.  Oropharynx clear. NECK:  Supple.  No cervical lymphadenopathy. LUNGS:  Clear to auscultation bilaterally. CARDIOVASCULAR:  Regular rate and rhythm.  Distant heart sounds.  No murmurs, rubs, or gallops. ABDOMEN:  Soft and nontender.  No hepatosplenomegaly appreciated. Normal bowel sounds.  Three small healed  port scars. EXTREMITIES:  No edema or cyanosis.  Pulses 2+ and equal in right radial, left pedal, and right pedal.  Pulse 1+ in left radial. NEUROLOGIC:  Alert and oriented x3.  Cranial nerves II-XII intact.  5/5 strength throughout.  Intact sensation to light touch throughout. SKIN:  No rash.  LABORATORY DATA ON ADMISSION TO [place]:  White blood cell count 7.9, hemoglobin 9.4, hematocrit 28.2, platelet count 65, MCV 95.6.  Urine drug screen negative.   Labs taken at [facility] on the morning of February 05, 2011 prior to  admission:  White blood count 9.6, CBC 10.5, hematocrit 31.0, MCV 94.8, platelets 84.  BMP:  Chemistries normal.  Creatinine 1.22.  LFTs:  Bilirubin 1.2, alk phos 57, AST 38, ALT 36, total protein 6.8, albumin 3.4, and calcium 10.2.  Lipase 21. [place] urinalysis showed 500 glucose, otherwise negative.  PT 15.7, PTT 25.  HOSPITAL COURSE BY PROBLEMS: 1. Upper GI bleed.  Eddie [name] was admitted to [facility] after 3     episodes of hematemesis and 1 episode of melena.  After arrival     Eddie [name] did not have any further episodes of hematemesis.       On the second day of admission, he had a bowel movement and      again had black stools, but has not had any melena since.  After      admission, he no longer complained of any dizziness, lightheadedness     or abdominal pain.  Upon admission, he was started on 40 mg of      Protonix b.i.d. IV and restarted on propranolol. Zofran was given      for nausea p.r.n.  He underwent an EGD by GI who felt that his     esophagitis was the likely cause of his upper GI bleed.  Varices     were seen but they were not felt to be the cause.  They recommended     that he go home with 1 month of PPIs at 40 mg b.i.d.  At the time     of discharge, his hemoglobin was stable.  He no longer had any      active bleeding and felt that his energy level and appetite had      returned to his baseline.  2. Anemia.  Eddie [name]'  hemoglobin was initially 10.5 at [facility], dropped to 9.4, it then dropped again on the night of     admission to 8.2 after getting bolused with fluids in the ED.  This     drop was most likely due to adequate resuscitation.  He was given 1     unit of packed RBCs without any problems.  The next morning, his     hemoglobin was 8.6 and on the morning of discharge, his hemoglobin     was 8.3.  The BMET panel performed during this admission     demonstrated that his iron, vitamin B12, TIBC, ferritin, percent     saturation and folate were all within normal limits.  Eddie [name]     has had chronic anemia and thrombocytopenia thought to be due to      his long history of alcohol abuse and bone marrow suppression.  3. Hypertension.  Eddie [name] was initially continued on his home     propranolol.  Pressures were well controlled with systolics     mostly between 100-110.  Given that he was found to have varices,     although he has never had any bleeding from his varices, his      propranolol was discontinued and he was started on Coreg 3.125 mg      b.i.d. at the time of discharge for primary prophylaxis for      esophageal varices.  4. Cirrhosis.  Eddie [name]' AFP was checked and found to be 13.2.  The     last time it was checked was on March 26, 2007 when he had a value     of 8.5.  He has had values from the past greater than 13.  He has  had a CT abdomen within the past year that was not concerning for     malignancy.  5. Thrombocytopenia.  Eddie [name] also has a long history of      thrombocytopenia thought to be secondary to alcohol abuse and      cirrhosis.  His platelets declined during the admission and on the      day of discharge his platelets were 38.  He was not actively      bleeding on the day of discharge.  This decline of platelets is      thought to be due to an acute consumptive process.  6. Diabetes.  While inpatient and n.p.o., he was continued on     sliding  scale insulin and hemoglobin A1c was checked and found to      be 7.3.  He was discharged on his home Lantus dose of 20 units at      bedtime.  DISCHARGE LABORATORY DATA:  Sodium 139, potassium 4, chloride 112,  bicarb 24, glucose 142, BUN 25, creatinine 1.08, and calcium 8.4.   White blood cell count 3.8, hemoglobin 8.3, hematocrit 24.5, MCV 95.7, platelet count 38.  HIV negative.  DISCHARGE VITAL SIGNS:  Temperature 97.9, pulse 70, respirations 20, blood pressure 99/64, O2 Sat 99% on room air.   ______________________________ Darnelle Maffucci, MD   ______________________________ Doneen Poisson, MD   PT/MEDQ  D:  02/07/2011  T:  02/08/2011  Job:  [id number]  cc:   Sinda Du, MD  Electronically Signed by Darnelle Maffucci  on 02/15/2011 07:27:15 PM Electronically Signed by Doneen Poisson  on 02/18/2011 06:09:53 PM

## 2011-02-19 ENCOUNTER — Encounter: Payer: Medicare Other | Admitting: Ophthalmology

## 2011-03-01 ENCOUNTER — Encounter: Payer: Self-pay | Admitting: Internal Medicine

## 2011-03-01 ENCOUNTER — Ambulatory Visit (INDEPENDENT_AMBULATORY_CARE_PROVIDER_SITE_OTHER): Payer: Medicare Other | Admitting: Internal Medicine

## 2011-03-01 VITALS — BP 125/73 | HR 73 | Temp 97.8°F | Ht 72.0 in | Wt 180.6 lb

## 2011-03-01 DIAGNOSIS — E119 Type 2 diabetes mellitus without complications: Secondary | ICD-10-CM

## 2011-03-01 DIAGNOSIS — I81 Portal vein thrombosis: Secondary | ICD-10-CM

## 2011-03-01 DIAGNOSIS — I10 Essential (primary) hypertension: Secondary | ICD-10-CM

## 2011-03-01 DIAGNOSIS — K922 Gastrointestinal hemorrhage, unspecified: Secondary | ICD-10-CM

## 2011-03-01 DIAGNOSIS — K7460 Unspecified cirrhosis of liver: Secondary | ICD-10-CM

## 2011-03-01 LAB — CBC WITH DIFFERENTIAL/PLATELET
Basophils Absolute: 0 10*3/uL (ref 0.0–0.1)
HCT: 26.5 % — ABNORMAL LOW (ref 39.0–52.0)
Lymphocytes Relative: 24 % (ref 12–46)
Neutro Abs: 2 10*3/uL (ref 1.7–7.7)
Platelets: 96 10*3/uL — ABNORMAL LOW (ref 150–400)
RDW: 15.2 % (ref 11.5–15.5)
WBC: 3.4 10*3/uL — ABNORMAL LOW (ref 4.0–10.5)

## 2011-03-01 LAB — COMPREHENSIVE METABOLIC PANEL
ALT: 36 U/L (ref 0–53)
AST: 41 U/L — ABNORMAL HIGH (ref 0–37)
Albumin: 3.3 g/dL — ABNORMAL LOW (ref 3.5–5.2)
Calcium: 9.1 mg/dL (ref 8.4–10.5)
Chloride: 101 mEq/L (ref 96–112)
Potassium: 4.1 mEq/L (ref 3.5–5.3)

## 2011-03-01 LAB — GLUCOSE, CAPILLARY: Glucose-Capillary: 353 mg/dL — ABNORMAL HIGH (ref 70–99)

## 2011-03-01 MED ORDER — INSULIN GLARGINE 100 UNIT/ML ~~LOC~~ SOLN: 22.0000 [IU] | Freq: Every day | SUBCUTANEOUS | Status: DC

## 2011-03-01 NOTE — Progress Notes (Signed)
  Subjective:    Patient ID: Eddie [name], male    DOB: 05-26-1941, 70 y.o.   MRN: [id number]  HPI Mr. Schiffman is a 70 year old man with complicated PMH of cirrhosis,b past severe alcohol abuse, hepatitis C infection, throat cancer, colon cancer, prostate cancer, chronic pancreatitis, anemia, thrombocytopenia, type 2 diabetes and upper GI Bleeding likely due to esophagitis who came to [facility] for recent hospital discharge follow-up. He has been doing well, no hematemesis, abdominal pain, or hematochezia. He said only has fatigue, dark stool. No other c/o. On discharge his HgB 8.3. CT of abdomen/pelvis shows Nonocclusive thrombus within the left portal, superior mesenteric and left gonadal veins.      Review of Systems  Constitutional: Negative.  Negative for fever, chills, diaphoresis and appetite change.  HENT: Negative for congestion and neck stiffness.   Eyes: Negative for discharge.  Respiratory: Negative for cough, choking, chest tightness, shortness of breath and wheezing.   Cardiovascular: Negative for chest pain, palpitations and leg swelling.  Gastrointestinal: Negative for nausea, vomiting, abdominal pain, diarrhea, blood in stool, abdominal distention and rectal pain.  Genitourinary: Negative for dysuria, frequency, flank pain, enuresis and difficulty urinating.  Neurological: Negative for dizziness, syncope, light-headedness and headaches.       Objective:   Physical Exam  Constitutional: He is oriented to person, place, and time. He appears well-developed and well-nourished. No distress.  HENT:  Head: Normocephalic.  Mouth/Throat: No oropharyngeal exudate.  Eyes: Conjunctivae and EOM are normal. Pupils are equal, round, and reactive to light. Right eye exhibits no discharge. Left eye exhibits no discharge. No scleral icterus.  Neck: Normal range of motion. Neck supple. No JVD present.  Cardiovascular: Normal rate, regular rhythm and intact distal pulses.  Exam reveals no gallop  and no friction rub.   No murmur heard. Pulmonary/Chest: Effort normal and breath sounds normal. No respiratory distress. He has no wheezes.  Abdominal: Soft. Bowel sounds are normal. He exhibits no distension. There is no tenderness. There is no rebound and no guarding.  Genitourinary:       Rectal exam shows brownish and greenish color, FOBT negative.   Musculoskeletal: Normal range of motion.  Lymphadenopathy:    He has no cervical adenopathy.  Neurological: He is alert and oriented to person, place, and time. He has normal reflexes.  Skin: He is not diaphoretic.          Assessment & Plan:

## 2011-03-01 NOTE — Assessment & Plan Note (Signed)
Will continue beta-blocker coreg and asked him to stop propranolol.

## 2011-03-01 NOTE — Patient Instructions (Signed)
Please go to ED if any active bleeding or black stool.  We will call you once the appointment with your GI doctor is setup. Please check your blood sugar every day and bring your glucometer back to Clinic at your next visit. If your symptom becomes worse, please call clinic for appointment.

## 2011-03-01 NOTE — Assessment & Plan Note (Addendum)
He has recent upper GI bleeding and said still has dark stool. But rectal exam did not show dark stool and FOBT negative. CBC stat shows HgB 8.8 and better than on discharge of 8.3. But CT of abdomen shows nonocclusive thrombus within the left portal, superior  mesenteric and left gonadal veins, which was not noted in his previous one in 08/2010. Inpatient team also don't know who ordered this, likely from [facility].  Because he has recent GI bleeding and will have GI referral for management of the thrombosis, such as anticoagulations, observation or surgery. Advised patient to go to ED if any active bleeding or black stool. Continue PPI.

## 2011-03-01 NOTE — Assessment & Plan Note (Addendum)
His BP well controlled. He has taken both coreg and propranolol, the later was stopped at discharge. Has told him not to take propranolol.

## 2011-03-01 NOTE — Assessment & Plan Note (Signed)
His recent A1C 7.3 in 01/2011 and today's CBG 353, has been taking lantus 20 units QHS. He has not checked his CBG for several months, will increase lantus to 22 units and asked him to check CBG daily and bring his glucometer back at next visit in 2-3 weeks.

## 2011-03-08 ENCOUNTER — Telehealth: Payer: Self-pay | Admitting: Internal Medicine

## 2011-03-08 DIAGNOSIS — I81 Portal vein thrombosis: Secondary | ICD-10-CM | POA: Insufficient documentation

## 2011-03-08 NOTE — Assessment & Plan Note (Signed)
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 I have set up GI referral for this. I talked with his oncologist Dr. Sofie Hartigan who ordered it and he was aware of this and would like to use lovenox if GI wants to start anticoagulation. The only opening available slot with GI Dr. Marina Goodell is 04/04/2011 and we needs him to be seen sooner and his office staff will try to schedule earlier once they review his CT findings and they will call patient about the appointment.

## 2011-03-09 ENCOUNTER — Other Ambulatory Visit: Payer: Self-pay | Admitting: Internal Medicine

## 2011-03-09 ENCOUNTER — Other Ambulatory Visit: Payer: Self-pay | Admitting: Ophthalmology

## 2011-03-09 MED ORDER — OMEPRAZOLE 20 MG PO CPDR: 20.0000 mg | DELAYED_RELEASE_CAPSULE | Freq: Two times a day (BID) | ORAL | Status: DC

## 2011-03-09 MED ORDER — OMEPRAZOLE 40 MG PO CPDR: 40.0000 mg | DELAYED_RELEASE_CAPSULE | Freq: Every day | ORAL | Status: DC

## 2011-03-09 NOTE — Telephone Encounter (Signed)
Change to Omeprazole 40 mg a day

## 2011-03-09 NOTE — Telephone Encounter (Signed)
I cahanged from Nexium to generic Omeprazole, but RF ok-sent to [facility]

## 2011-03-13 LAB — GLUCOSE, CAPILLARY
Glucose-Capillary: 102 mg/dL — ABNORMAL HIGH (ref 70–99)
Glucose-Capillary: 103 mg/dL — ABNORMAL HIGH (ref 70–99)

## 2011-03-13 NOTE — Progress Notes (Signed)
Summary: Appt sooner  Phone Note From Other Clinic   Caller: [name] [phone] outpatient clinic Call For: Dr Marina Goodell Reason for Call: Schedule Patient Appt Summary of Call: Would like pt seen sooner than first available on 04-04-11 for abnormal CT Scan. Initial call taken by: Leanor Kail [facility],  March 08, 2011 11:29 AM  Follow-up for Phone Call        Outpatient clinic would like patient seen sooner than first available due to abnormal CT scan results. CT was done on 02/16/11. Dr. Marina Goodell please advise. Follow-up by: Selinda Michaels RN,  March 08, 2011 11:48 AM  Additional Follow-up for Phone Call Additional follow up Details #1::         I have seen this patient in the past for this same problem. He was seen by my colleague in the hospital just a few weeks ago. Current appointment in April is sufficient Additional Follow-up by: Hilarie Fredrickson MD,  March 08, 2011 12:38 PM    Additional Follow-up for Phone Call Additional follow up Details #2::    Left message for [facility] regarding Dr. Lamar Sprinkles recommendations. Appt in the system for April 04 2011 @11 :15am. Follow-up by: Selinda Michaels RN,  March 08, 2011 1:50 PM

## 2011-03-13 NOTE — Miscellaneous (Signed)
Summary: CT Scan  CT Chest W/CM. - STATUS: Final  IMAGE                                     Perform Date: 17Feb12 19:05  Ordered By: DEFAULT MD , PROVIDER         Ordered Date:  Facility: [facility]                              Department: CT  Service Report Text   [facility] Accession Number: [id number]     Clinical Data:  Prostate cancer diagnosed 1 year ago with   chemotherapy completed.  History of colon cancer 2 years ago   treated surgically and throat cancer diagnosed 3 years ago treated   with chemotherapy and radiation therapy.    CT NECK, CHEST, ABDOMEN AND PELVIS WITH CONTRAST    Technique:  Multidetector CT imaging of the neck, chest, abdomen   and pelvis was performed using the standard protocol following the   bolus administration of intravenous contrast.    Contrast: 100 ml Omnipaque-300 intravenously.    Comparison:  Prior examinations 08/07/2010, 04/28/2009 and   10/26/2008. PET CT 07/19/2008.    CT NECK    Findings:  No lesions of the pharyngeal mucosal space are   demonstrated.  There are no pathologically enlarged cervical lymph   nodes.  The thyroid and salivary glands appear unremarkable.    There are bilateral carotid arterial calcifications.  A small left   vertebral artery has a stable appearance.  Moderate cervical   spondylosis appears stable.  The visualized intracranial contents   are unremarkable.  The paranasal sinuses are clear.    IMPRESSION:   Stable CT of the neck.  No recurrent mass or adenopathy.    CT CHEST    Findings: There are no enlarged mediastinal, hilar or axillary   lymph nodes.  Multiple distal esophageal varices are again noted.   There is a small amount of pericardial fluid.  No significant   pleural effusion is present.    The lungs demonstrate minimal emphysematous changes.  There is no   nodule, airspace disease or endobronchial lesion.  There are no   suspicious osseous findings.    IMPRESSION:   No acute chest findings or  evidence of metastatic disease.  Stable   distal esophageal varices.    CT ABDOMEN AND PELVIS    Findings: There are stable changes of cirrhosis within the liver   with a stable cyst superior to the left portal vein.  There is new   nonocclusive thrombus within the left portal vein (axial image 58,   but more obvious on the coronal images).  There is also   nonocclusive thrombus within the superior mesenteric vein extending   to the portal confluence.  This appears adherent and is likely   subacute.  Thrombus extends into the left gonadal vein.  The   splenic vein appears patent.  As before, there is mild splenomegaly   with gastric and esophageal varices.  There is nonspecific gastric   wall thickening.    Small gallstones are noted.  There is mild gallbladder wall   thickening.  No significant biliary dilatation is identified.  The   pancreatic head and body appear normal.  There is dilatation of the   pancreatic duct in the pancreatic tail  with associated parenchymal   calcifications.  This appears stable.    There is no adrenal mass.  Small nonobstructive renal calculi are   present bilaterally.  There are small renal cysts bilaterally.    Prostate brachytherapy seeds are noted with surrounding scarring   extending into the perirectal fat.  There is mild bladder wall   thickening.  A small postsurgical fluid collection is present   medially in the left groin, probably related to prior inguinal   hernia repair.  This appears similar to the prior PET CT.  There   are no enlarged abdominal pelvic lymph nodes. Edema within the base   of the mesentery has improved compared with the prior examination.   The bowel gas pattern is normal.  Chronic periumbilical hernia   containing small bowel loops is unchanged without evidence of   incarceration or obstruction.  There are no suspicious osseous   findings.    IMPRESSION:    1.  Nonocclusive thrombus within the left portal,  superior   mesenteric and left gonadal veins.   2.  Cirrhosis with persistent evidence of portal hypertension with   gastric and esophageal varices.   3.  Stable chronic pancreatitis involving the pancreatic tail.   Pancreatic ductal dilatation in the pancreatic tail suggests an   underlying chronic stricture.   4.  No definite metastatic disease.    Results were discussed briefly (primarily regarding the venous   thrombosis) with Dr. Myna Hidalgo on 02/16/2011 at 1620 hours.    Original Report Authenticated By: Gerrianne Scale, M.D.  Additional Information  HL7 RESULT STATUS : F  External image : [id number]  External IF Update Timestamp : 2011-02-16:19:05:00.  Clinical Lists Changes

## 2011-03-15 LAB — GLUCOSE, CAPILLARY: Glucose-Capillary: 157 mg/dL — ABNORMAL HIGH (ref 70–99)

## 2011-03-16 LAB — GLUCOSE, CAPILLARY
Glucose-Capillary: 162 mg/dL — ABNORMAL HIGH (ref 70–99)
Glucose-Capillary: 162 mg/dL — ABNORMAL HIGH (ref 70–99)

## 2011-03-18 LAB — DIFFERENTIAL
Basophils Relative: 0 % (ref 0–1)
Eosinophils Absolute: 0.2 10*3/uL (ref 0.0–0.7)
Monocytes Absolute: 0.6 10*3/uL (ref 0.1–1.0)
Neutrophils Relative %: 54 % (ref 43–77)

## 2011-03-18 LAB — CROSSMATCH

## 2011-03-18 LAB — CBC
HCT: 25.2 % — ABNORMAL LOW (ref 39.0–52.0)
HCT: 30 % — ABNORMAL LOW (ref 39.0–52.0)
HCT: 31.4 % — ABNORMAL LOW (ref 39.0–52.0)
Hemoglobin: 10.2 g/dL — ABNORMAL LOW (ref 13.0–17.0)
Hemoglobin: 10.6 g/dL — ABNORMAL LOW (ref 13.0–17.0)
Hemoglobin: 8.3 g/dL — ABNORMAL LOW (ref 13.0–17.0)
MCV: 90.5 fL (ref 78.0–100.0)
Platelets: 102 10*3/uL — ABNORMAL LOW (ref 150–400)
Platelets: 105 10*3/uL — ABNORMAL LOW (ref 150–400)
Platelets: 90 10*3/uL — ABNORMAL LOW (ref 150–400)
RBC: 2.75 MIL/uL — ABNORMAL LOW (ref 4.22–5.81)
WBC: 3.1 10*3/uL — ABNORMAL LOW (ref 4.0–10.5)
WBC: 3.9 10*3/uL — ABNORMAL LOW (ref 4.0–10.5)
WBC: 4.2 10*3/uL (ref 4.0–10.5)

## 2011-03-18 LAB — COMPREHENSIVE METABOLIC PANEL
Albumin: 2.7 g/dL — ABNORMAL LOW (ref 3.5–5.2)
Alkaline Phosphatase: 67 U/L (ref 39–117)
BUN: 11 mg/dL (ref 6–23)
CO2: 25 mEq/L (ref 19–32)
Chloride: 107 mEq/L (ref 96–112)
GFR calc non Af Amer: >60 mL/min (ref >60)
Glucose, Bld: 94 mg/dL (ref 70–99)
Potassium: 4.5 mEq/L (ref 3.5–5.1)
Total Bilirubin: 0.4 mg/dL (ref 0.3–1.2)

## 2011-03-18 LAB — URINALYSIS, ROUTINE W REFLEX MICROSCOPIC
Bilirubin Urine: NEGATIVE
Hgb urine dipstick: NEGATIVE
Specific Gravity, Urine: 1.021 (ref 1.005–1.030)
Urobilinogen, UA: 0.2 mg/dL (ref 0.0–1.0)
pH: 7.5 (ref 5.0–8.0)

## 2011-03-18 LAB — GLUCOSE, CAPILLARY
Glucose-Capillary: 80 mg/dL (ref 70–99)
Glucose-Capillary: 88 mg/dL (ref 70–99)
Glucose-Capillary: 96 mg/dL (ref 70–99)

## 2011-03-18 LAB — POCT I-STAT, CHEM 8
BUN: 14 mg/dL (ref 6–23)
Chloride: 103 mEq/L (ref 96–112)
HCT: 31 % — ABNORMAL LOW (ref 39.0–52.0)
Potassium: 4.5 mEq/L (ref 3.5–5.1)
Sodium: 138 mEq/L (ref 135–145)

## 2011-03-18 LAB — BASIC METABOLIC PANEL
BUN: 9 mg/dL (ref 6–23)
GFR calc non Af Amer: >60 mL/min (ref >60)
Potassium: 4.1 mEq/L (ref 3.5–5.1)
Sodium: 136 mEq/L (ref 135–145)

## 2011-03-18 LAB — RAPID URINE DRUG SCREEN, HOSP PERFORMED
Amphetamines: NOT DETECTED
Cocaine: NOT DETECTED
Opiates: NOT DETECTED
Tetrahydrocannabinol: NOT DETECTED

## 2011-03-18 LAB — LIPASE, BLOOD: Lipase: 25 U/L (ref 11–59)

## 2011-03-18 LAB — LIPID PANEL
Cholesterol: 116 mg/dL (ref 0–200)
LDL Cholesterol: 60 mg/dL (ref 0–99)
Total CHOL/HDL Ratio: 2.7 RATIO

## 2011-03-19 LAB — GLUCOSE, CAPILLARY: Glucose-Capillary: 144 mg/dL — ABNORMAL HIGH (ref 70–99)

## 2011-03-20 LAB — GLUCOSE, CAPILLARY: Glucose-Capillary: 169 mg/dL — ABNORMAL HIGH (ref 70–99)

## 2011-03-21 ENCOUNTER — Ambulatory Visit (INDEPENDENT_AMBULATORY_CARE_PROVIDER_SITE_OTHER): Payer: Medicare Other | Admitting: Ophthalmology

## 2011-03-21 ENCOUNTER — Encounter: Payer: Self-pay | Admitting: Ophthalmology

## 2011-03-21 ENCOUNTER — Other Ambulatory Visit: Payer: Self-pay | Admitting: Internal Medicine

## 2011-03-21 VITALS — BP 137/75 | HR 98 | Temp 97.4°F | Ht 72.0 in | Wt 182.0 lb

## 2011-03-21 DIAGNOSIS — E119 Type 2 diabetes mellitus without complications: Secondary | ICD-10-CM

## 2011-03-21 DIAGNOSIS — K922 Gastrointestinal hemorrhage, unspecified: Secondary | ICD-10-CM

## 2011-03-21 DIAGNOSIS — I10 Essential (primary) hypertension: Secondary | ICD-10-CM

## 2011-03-21 LAB — GLUCOSE, CAPILLARY: Glucose-Capillary: 361 mg/dL — ABNORMAL HIGH (ref 70–99)

## 2011-03-21 MED ORDER — CARVEDILOL 3.125 MG PO TABS: 3.1250 mg | ORAL_TABLET | Freq: Two times a day (BID) | ORAL | Status: DC

## 2011-03-21 MED ORDER — OMEPRAZOLE 40 MG PO CPDR: 40.0000 mg | DELAYED_RELEASE_CAPSULE | Freq: Every day | ORAL | Status: DC

## 2011-03-21 NOTE — Assessment & Plan Note (Signed)
The patient is scheduled to follow up with Dr. Marina Goodell for this on 04/04/2011. The patient may need Lovenox but we will defer this to GI.

## 2011-03-21 NOTE — Assessment & Plan Note (Signed)
I will continue Corag at this time as propranolol was discontinued at his last hospitalization. This patient will be followed I Dr. Marina Goodell of GI starting 04/04/2011.

## 2011-03-21 NOTE — Progress Notes (Signed)
Subjective:    Patient ID: Eddie [name], male    DOB: [birth year], 70 y.o.   MRN: [id number]  HPI    This is a 70 year old man with a past medical history significant for cirrhosis, past severe alcohol abuse, hepatitis C as well as throat, prostate, and colon cancer , who last presented for followup with Dr. Threasa Beards  As hospital followup for upper GI bleed. At the time of his last visit on March 1, the patient had had no hematemesis abdominal pain or hematochezia but continued to have dark stool. The patient is scheduled to followup with GI on 04/04/2011 and FOBT at the last visit was heme-negative. 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   Since his last visit, the patient denies any further problems. He denies any dark stool, blood in the stool, hematemesis or significant abdominal pain and he does occasionally have mild abdominal pain at night. The patient does not require medicines for this. At this point time, the patient does continue to complain of fatigue and states that he has been this way since his discharge in February. The patient's hemoglobin at his last visit was 8.8 which was up from his discharge. I suspect that the patient's fatigue could be related to this anemia. The patient states that he is sleeping fine has not had loss of appetite, has had no change in his bowel movements nor has he had any fevers or chills.  With regards to his diabetes, the patient has not described any particular diet he does not check his own CBGs his last A1c was 7.3 in February. The patient's Lantus was increased to 22 units daily at his last visit.    Review of Systems  Constitutional: Negative for fever and chills.  Respiratory: Negative for cough and shortness of breath.   Cardiovascular: Negative for chest pain and palpitations.  Gastrointestinal: Negative for vomiting, diarrhea and  constipation.       Objective:   Physical Exam  Constitutional: He appears well-developed and well-nourished.  HENT:  Head: Normocephalic and atraumatic.  Eyes: Pupils are equal, round, and reactive to light.  Cardiovascular: Normal rate, regular rhythm and intact distal pulses.  Exam reveals no gallop and no friction rub.   No murmur heard. Pulmonary/Chest: Effort normal and breath sounds normal. He has no wheezes. He has no rales.  Abdominal: Soft. Bowel sounds are normal. He exhibits no distension. There is no tenderness.  Musculoskeletal: Normal range of motion.  Neurological: He is alert. No cranial nerve deficit.  Skin: No rash noted.        Current Outpatient Prescriptions on File Prior to Visit  Medication Sig Dispense Refill  . aspirin 81 MG EC tablet Take 81 mg by mouth daily.        . carvedilol (COREG) 3.125 MG tablet Take 3.125 mg by mouth 2 (two) times daily with a meal.       . folic acid (FOLVITE) 1 MG tablet Take 1 mg by mouth daily.        Marland Kitchen glucose blood (ACCU-CHEK AVIVA) test strip Use to test blood glucose 3 times daily       . insulin glargine (LANTUS) 100 UNIT/ML injection Inject 22 Units into the skin at bedtime.  10 mL  1  . Insulin Syringe-Needle U-100 (ELITE-THIN INS SYR .5CC/31G) 31G X 5/16" 0.5 ML MISC Use as directed       .  Lancets MISC Use to test blood glucose 3 times daily       . Multiple Vitamin (MULTIVITAMIN) tablet Take 1 tablet by mouth daily.        Marland Kitchen DISCONTD: omeprazole (PRILOSEC) 20 MG capsule Take 1 capsule (20 mg total) by mouth 2 (two) times daily.  60 capsule  1  . DISCONTD: omeprazole (PRILOSEC) 40 MG capsule Take 1 capsule (40 mg total) by mouth daily.  30 capsule  12    Past Medical History  Diagnosis Date  . Cancer     prostate ca  . Cancer of colon   . Cancer     tonsillar  . Upper GI bleeding     EGD on 01/2011 shows esophagitis, grade 1 varices  . Hypertension   . Cirrhosis   . Anemia   . Diabetes mellitus   .  Hepatitis C   . Gallstone   . Thrush, oral   . GERD (gastroesophageal reflux disease)   . DM gastroparesis   . Gastritis   . Hernia   . Pancreatitis      Assessment & Plan:

## 2011-03-21 NOTE — Assessment & Plan Note (Signed)
The patient has known varices , but currently denies any hematemesis or black stool. He does complain of some fatigue which may be related to anemia and given history of upper GI bleed  With H. Pylori infection (for which he has had 10 days of triple therapy), I will recheck a CBC today.

## 2011-03-21 NOTE — Assessment & Plan Note (Signed)
The patients GERD is under good control at this time and the patient is taking their medications regularly.  The patient was encouraged to continue to avoid triggers, avoid laying flat within two hours of a meal.  Will continue to monitor for continued control at future visits.   

## 2011-03-21 NOTE — Assessment & Plan Note (Signed)
This has been treated x10 days with an appropriate course of triple therapy.

## 2011-03-21 NOTE — Assessment & Plan Note (Addendum)
The patient has been using his insulin regularly , and currently does not check his CBGs. The patient does not wish to start at this time either. I will continue to monitor the patient's hemoglobin A1c but is not due for one at this time.  The patient's CBG was quite elevated today , but I elected to not increase the patient's Lantus dose at this time though I will consider increasing his dose at his next visit if his A1c is elevated. My reasoning for this is that the patient's last A1c was reasonable not wish to increase his risk of hypoglycemia. The patient had also recently eaten  cthe time of this visit Last A1c in February was 7.3. I will continue the patient on his Lantus 22 units daily,  And followup in May for repeat A1c in titration of his medications. I did encourage the patient to begin using his glucometer so that we can follow his postprandial CBGs.

## 2011-03-21 NOTE — Patient Instructions (Addendum)
Please return during the month of May for followup of your diabetes. Also remember to follow up with Dr. Marina Goodell on April 4. Please call us if you need anything before then.

## 2011-03-22 ENCOUNTER — Other Ambulatory Visit: Payer: Self-pay | Admitting: Internal Medicine

## 2011-03-22 ENCOUNTER — Encounter (HOSPITAL_BASED_OUTPATIENT_CLINIC_OR_DEPARTMENT_OTHER): Payer: Medicare Other | Admitting: Internal Medicine

## 2011-03-22 DIAGNOSIS — C61 Malignant neoplasm of prostate: Secondary | ICD-10-CM

## 2011-03-22 DIAGNOSIS — D696 Thrombocytopenia, unspecified: Secondary | ICD-10-CM

## 2011-03-22 DIAGNOSIS — C185 Malignant neoplasm of splenic flexure: Secondary | ICD-10-CM

## 2011-03-22 DIAGNOSIS — C099 Malignant neoplasm of tonsil, unspecified: Secondary | ICD-10-CM

## 2011-03-22 DIAGNOSIS — C090 Malignant neoplasm of tonsillar fossa: Secondary | ICD-10-CM

## 2011-03-22 DIAGNOSIS — C189 Malignant neoplasm of colon, unspecified: Secondary | ICD-10-CM

## 2011-03-22 LAB — CBC
HCT: 31 % — ABNORMAL LOW (ref 39.0–52.0)
Platelets: 78 10*3/uL — ABNORMAL LOW (ref 150–400)
RDW: 16.4 % — ABNORMAL HIGH (ref 11.5–15.5)
WBC: 3.1 10*3/uL — ABNORMAL LOW (ref 4.0–10.5)

## 2011-04-04 ENCOUNTER — Ambulatory Visit: Payer: Medicare Other | Admitting: Internal Medicine

## 2011-04-04 LAB — GLUCOSE, CAPILLARY: Glucose-Capillary: 147 mg/dL — ABNORMAL HIGH (ref 70–99)

## 2011-04-06 LAB — GLUCOSE, CAPILLARY
Glucose-Capillary: 65 mg/dL — ABNORMAL LOW (ref 70–99)
Glucose-Capillary: 67 mg/dL — ABNORMAL LOW (ref 70–99)
Glucose-Capillary: 75 mg/dL (ref 70–99)
Glucose-Capillary: 84 mg/dL (ref 70–99)

## 2011-04-12 LAB — GLUCOSE, CAPILLARY
Glucose-Capillary: 60 mg/dL — ABNORMAL LOW (ref 70–99)
Glucose-Capillary: 73 mg/dL (ref 70–99)

## 2011-04-16 LAB — GLUCOSE, CAPILLARY
Glucose-Capillary: 94 mg/dL (ref 70–99)
Glucose-Capillary: 100 mg/dL — ABNORMAL HIGH (ref 70–99)

## 2011-05-01 ENCOUNTER — Ambulatory Visit (INDEPENDENT_AMBULATORY_CARE_PROVIDER_SITE_OTHER): Payer: Medicare Other | Admitting: Internal Medicine

## 2011-05-01 ENCOUNTER — Encounter: Payer: Self-pay | Admitting: Internal Medicine

## 2011-05-01 ENCOUNTER — Telehealth: Payer: Self-pay | Admitting: Internal Medicine

## 2011-05-01 VITALS — BP 112/60 | HR 72 | Ht 72.0 in | Wt 180.8 lb

## 2011-05-01 DIAGNOSIS — K7030 Alcoholic cirrhosis of liver without ascites: Secondary | ICD-10-CM

## 2011-05-01 DIAGNOSIS — Z85038 Personal history of other malignant neoplasm of large intestine: Secondary | ICD-10-CM

## 2011-05-01 DIAGNOSIS — K219 Gastro-esophageal reflux disease without esophagitis: Secondary | ICD-10-CM

## 2011-05-01 NOTE — Patient Instructions (Signed)
Follow-up in 6 months with Dr. Perry. 

## 2011-05-01 NOTE — Progress Notes (Signed)
HISTORY OF PRESENT ILLNESS:  Eddie [name] is a 70 y.o. male with multiple significant medical problems as listed below. 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 No adjuvant therapy. He has been followed in this office for management of his liver disease and surveillance colonoscopy. Last seen in November 2011 for surveillance colonoscopy. No recurrent neoplasia. Followup in 2 years recommended. He's had today for post hospitalization followup. He was admitted February 2 through 02/07/2011 when he presented to the hospital with low volume hematemesis. He underwent upper endoscopy and was found to have erosive esophagitis and portal gastropathy. Small grade 1 varices. He had been off his PPI. PPI resumed in this appointment made. Heart denies having any further problems with nausea vomiting or abdominal pain. Normal bowels. No evidence for fluid accumulation or hepatic encephalopathy. Admission hemoglobin was 9.4. Baseline 9.5. During his hospital stay he did undergo CT scan of the neck chest abdomen and pelvis. No evidence of recurrent cancer or new cancer. Cirrhosis and chronic pancreatitis again noted.  REVIEW OF SYSTEMS:  All non-GI ROS are reported as negative.  Past Medical History  Diagnosis Date  . Cancer     prostate ca  . Cancer of colon   . Cancer     tonsillar  . Upper GI bleeding     EGD on 01/2011 shows esophagitis, grade 1 varices  . Hypertension   . Cirrhosis   . Anemia   . Diabetes mellitus   . Hepatitis C   . Gallstone   . Thrush, oral   . GERD (gastroesophageal reflux disease)   . DM gastroparesis   . Gastritis   . Hernia   . Pancreatitis     Past Surgical  History  Procedure Date  . Colon surgery   . Hernia repair     Social History Eddie [name]  reports that he has never smoked. He has never used smokeless tobacco. He reports that he does not drink alcohol or use illicit drugs.  family history includes Alzheimer's disease in his mother and Breast cancer in his mother.  No Known Allergies     PHYSICAL EXAMINATION:  Vital signs: BP 112/60  Pulse 72  Ht 6' (1.829 m)  Wt 180 lb 12.8 oz (82.01 kg)  BMI 24.52 kg/m2 General: Well-developed, well-nourished, no acute distress HEENT: Sclerae are anicteric, conjunctiva pink. Oral mucosa intact Lungs: Clear Heart: Regular Abdomen: soft, nontender, nondistended, no obvious ascites, no peritoneal signs, normal bowel sounds. No organomegaly. Surgical incision well healed Extremities: No edema Psychiatric: alert and oriented x3. Cooperative  Neuro: No asterixis  ASSESSMENT:  #1. 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 Clinically compensated. Recent endoscopy with grade 1 varices. His vaccination schedule is up-to-date. #2. GERD with erosive esophagitis. Recurrent problems off medication. He symptomatically on medication. #3. History of colon cancer status post left hemicolectomy November 2010. Negative surveillance colonoscopy in November 2011 #4. Chronic pancreatitis #5. History of tonsillar cancer   PLAN:  #1. Any current medications include omeprazole #2. Surveillance colonoscopy November 2013 #3. Surveillance upper endoscopy in 1 year #4. Routine office followup in 6 months #5. Resume general medical care with primary care physician

## 2011-05-02 NOTE — Telephone Encounter (Signed)
Called patient and he states his Omeprazole is out and he cannot get filled until May 9th.  I left samples at front desk for him to pick up.

## 2011-05-15 NOTE — H&P (Signed)
Eddie [name]            ACCOUNT NO.:  [id number]   MEDICAL RECORD NO.:  [id number]          PATIENT TYPE:  INP   LOCATION:  1333                         [facility]ACILITY[facility]   PHYSICIAN:  Eddie [name], MDDATE OF BIRTH:  1941/09/10   DATE OF ADMISSION:  03/16/2008  DATE OF DISCHARGE:                              HISTORY & PHYSICAL   PRIMARY CARDIOLOGIST:  Eddie Matte, MD.   ENT SURGEON:  Eddie H. Pollyann Kennedy, MD.   RADIATION ONCOLOGIST:  Eddie [name], M.D.   CHIEF COMPLAINT:  Intractable nausea and vomiting.   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  He initiated combined modality chemoradiation therapy on March 01, 2008,  per his report.  He says that for the last five days he has had severe  nausea and vomiting.  He initially was vomiting his tube feeds.  This  has progressed to coffee-ground emesis and some evidence of bright red  blood.  This has been associated with abdominal discomfort.  He has also  had poor energy but denies dizziness, lightheadedness, symptoms of  stroke, chest pain or shortness of breath.  He has taken Reglan with no  relief of his symptoms.  He has not tolerated his tube feeds over the  last few days.  He denies fever.  He denies evidence of lower GI bleed.  Eddie [name] also complains of oral discomfort and thick secretions.  He  is still able to tolerate water but has had an dysphagia to other p.o.  intake prior to this acute event.  As a result of his acute symptoms,  Eddie [name] missed his chemotherapy infusion appointment on the 16th, as  well as his two scheduled fractions of radiation therapy this week.   REVIEW OF SYSTEMS:  A comprehensive 10-point review of systems was  obtained and was negative except as per HPI.   PAST MEDICAL HISTORY:  1. Tonsillar cancer as above.  2. Hepatitis C.  3. Alcohol abuse.  4. Cirrhosis  secondary to hepatitis C and alcohol abuse.  5. Chronic pancreatitis.  6. Asymptomatic cholelithiasis.  7. Hypertension.  8. Type 2 diabetes.  9. Diabetic gastroparesis.  10.Latent tuberculosis status post treatment in 1999.  11.Status was left inguinal hernia repair in November 2000.  12.Status post transurethral resection of the prostate.  13.History of pancytopenia reportedly as secondary to marrow      suppression from prolonged alcohol abuse, also reported secondary      to mastocytosis.  14.History of acute renal failure.   SOCIAL HISTORY:  He lives in [place], [place], with his  mother.  She has Alzheimer's disease and he is her primary caregiver.  He has a sister who is nearby.  He is a former Warden/ranger.  He denies  tobacco abuse.  He is a former alcoholic, states his last drink was one  year ago.  Denies illicit drug use.   FAMILY HISTORY:  Mother with breast cancer and Alzheimer's disease.  Denies other history of malignancy in the family.  ALLERGIES:  No known drug allergies.   MEDICATIONS:  1. Benazepril 10 mg twice a day.  2. Glimepiride 10 mg twice a day.  3. Omeprazole 20 mg daily.  4. Baclofen 10 mg three times a day.  5. Reglan 10 mg three times a day.  6. Thorazine 25 mg three times a day as needed for hiccups.  7. Multivitamin.  8. Lantus insulin 40 units at night.  9. Folic acid 1 mg daily.   PHYSICAL EXAMINATION:  PERFORMANCE STATUS: 2-3.  VITAL SIGNS:  Temperature is 98.2, blood pressure 134/83, pulse 79,  respiratory rate 18, O2 saturation 99% on room air.  GENERAL:  Well-nourished, well-developed gentleman in significant  discomfort but no acute distress.  HEENT:  Sclerae anicteric.  Oropharynx with erythema and very thick  secretions.  No white plaques identified.  NECK:  Supple with submandibular lymph node palpated.  HEART:  Regular with no murmurs.  LUNGS:  Clear to auscultation bilaterally.  ABDOMEN:  Mild diffuse tenderness,  positive bowel sounds, PEG tube  intact, nontender, without erythema or drainage.  EXTREMITIES: Warm with no edema.  NEUROLOGIC: No focal deficits.   DATA REVIEW:  1. White count 1.3, hemoglobin 10.9, platelets 65, ANC 900.  2. Sodium 125, potassium 4.1, chloride 84, bicarb 31, glucose 385, BUN      18, creatinine 1.16.  Bilirubin 1.3.  Otherwise LFTs within normal      limits.  Albumin 3.  Amylase and lipase within normal limits.  3. Urinalysis with increased glucose, trace ketones and moderate      blood.   ASSESSMENT/PLAN:  1. Eddie [name] is a 70 year old gentleman with history of locally      advanced tonsillar cancer.  He has been treated with bimodality      chemoradiation therapy which was initiated on March 01, 2008, per      his report.  Unfortunately, due to his acute symptoms.  He missed      his scheduled weekly cycle of chemotherapy on March 15, 2008, and      his last two infractions of radiation therapy.  He will be admitted      to the oncology service for further management as below.  We will      contact his radiation oncology providers in the morning.  2. Intractable nausea and vomiting.  Likely secondary to secretions      and his treatment.  We will initiate Compazine, Zofran and Ativan      as needed.  We will hydrate him.  We will keep him n.p.o.  Mr.      [name] may eventually need evaluation by gastroenterology.  It may      be difficult to undergo upper endoscopy due to his thrombocytopenia      and his borderline neutropenia.  3. Hematemesis.  Seems to be secondary to his intractable vomiting as      he initially was vomiting tube feeds only, but after prolonged      vomiting began to have hematemesis.  He does have a history of      upper gastrointestinal bleeds in the past.  It is likely      exacerbated at this point by the fact that he has a platelet count      of only 65 after receiving chemotherapy.  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  We will keep him n.p.o.  as above.  We will initiate b.i.d. IV Protonix.  Will transfuse him      one pack of platelets given the setting of thrombocytopenia.  We      will follow his hemoglobin closely.  4. Abdominal and oral pain.  Eddie [name] is in significant pain.  This      has not been well controlled with p.r.n. IV pushes of Dilaudid in      the emergency department.  Will therefore initiate a morphine PCA      with 1 mg per hour initially and 0.5 mg q.15h. as needed.  Will      titrate as needed.  5. Neutropenia.  Eddie [name] has borderline neutropenia with white      count of 900.  It is likely secondary to his chemoradiation therapy      in the setting of his known bone marrow suppression secondary to      alcohol disease.  The regimen itself  is not typically noted to      call significant neutropenia.  Nonetheless, we will enact      neutropenic precautions.  We will follow his ANC closely.  Given      the fact that he is having a gastrointestinal, will initiate      ciprofloxacin 400 mg IV as prophylactic medication.  If he were to      have fever, we would broaden his coverage further.  6. Mucositis.  Secondary to treatment.  We will utilize Magic mouth      wash mixture for him to swish and spit.  We used a morphine PCA as      above.  Does not have evidence of thrush at this time.  We will      continue to monitor for development of this.  7. Diabetes.  Eddie [name]' blood sugar is elevated at this point.  He      will be n.p.o.  Will therefore hold his standing insulin and oral      hypoglycemics at this time.  We will initiate a sliding scale.  It      is likely, however, that we will need to add back some of his      standing insulin as we get a sense of what his requirements are.  8. Hypertension.  Eddie [name]' blood pressure is well-controlled at      this time.  Will continue his benazepril.  9. Deep venous thrombosis prophylaxis.  Eddie [name] is  at increased      risks with his malignancy and relative immobility.  We will not      initiate pharmacologic prophylaxis due to his upper      gastrointestinal bleed.  Will have, however, initiate sequential      compression devices.      Eddie Evener, MD  Electronically Signed     WT/MEDQ  D:  03/17/2008  T:  03/17/2008  Job:  [id number]

## 2011-05-15 NOTE — Discharge Summary (Signed)
NAMEEMIT, KUENZEL            ACCOUNT NO.:  [id number]   MEDICAL RECORD NO.:  [id number]          PATIENT TYPE:  INP   LOCATION:  5121                         FACILITY:  [facility]   PHYSICIAN:  Alvester Morin, M.D.  DATE OF BIRTH:  1941/01/27   DATE OF ADMISSION:  03/26/2008  DATE OF DISCHARGE:  03/31/2008                               DISCHARGE SUMMARY   DISCHARGE DIAGNOSIS:  As follows.  1. Diabetes mellitus with uncontrolled blood sugar.  2. Thrush.  3. Hypertension.  4. Gastroparesis.  5. Nausea and vomiting.  6. Hypomagnesium.  7. Hyperkalemia.  8. Tonsillar malignancy.   DISCHARGE MEDICATIONS:  Include,  1. Benazepril 10 mg p.o. daily.  2. Glucerna tube feedings.  3. Omeprazole 20 mg p.o. daily.  4. Glipizide 10 mg p.o. b.i.d.  5. Multivitamins p.o. daily.  6. Folic acid 1 mg p.o. daily.  7. Reglan 10 mg p.o. q.a.c. and h.s.  8. Fluconazole 100 mg p.o. daily x4 days.  9. Lantus 50 units subcutaneously nightly.   DISPOSITION ON FOLLOWUP:  The patient will followup with Dr. Maple Hudson in  the Outpatient Clinic on April 8 at 4:00 p.m.  At this time, the  patient's blood sugar should be reassessed.  The patient also needs  further titration of his Lantus to get his blood sugars out of control.  The patient also to followup with Dr. Antony Blackbird on March 31, 2008, at  3:00 p.m. to continue his chemotherapy.  This procedures performed  include, the patient had an abdominal x-ray on March 28, 2008.   IMPRESSION:  Nonobstructive bowel gas pattern.   CONSULTATION:  None.   CHIEF COMPLAINT:  Admission secondary to Lantus overdose.   PRIMARY CARE PHYSICIAN:  Dr. Maple Hudson.   HISTORY OF PRESENT ILLNESS:  The patient is a 70 year old male with past  medical history significant for tonsillar malignancy.  Diagnosed in  January 2009.  Currently, getting chemoradiation, DM2, alcoholism,  cirrhosis, hypertension, chronic pancreatitis, and hepatitis C who  presents to the clinic on the  day of admission secondary to taking  double his Lantus dose.  The patient reports he woke up on the a.m. of  admission with a CBG of 347 and took an additional dose of Lantus, which  was 45 units.  The patient also took his normal 45 units to night prior.  The patient was informed that he should come to the clinic as  precautionary reasons.  The patient did not have any episodes of  hypoglycemia, but will be admitted for safety precautions as well as the  patient does not have anyone at home living with him to take care of  him.   Allergies, past medical history, past surgical history, medications,  substance history, social history, family history.  Please see hospital  chart.   PHYSICAL EXAM:  VITAL SIGNS:  Temperature 98.2, blood pressure 106/66,  pulse equals 95.  GENERAL:  No acute distress, cachectic.  EYES:  PERLA, EOMI.  ENT:  Dry mucous membranes polyp, positive thrush.  NECK:  No bruits, no lymphadenopathy, no JVD.  RESPIRATORY:  CTA bilaterally.  CV:  Regular rate and rhythm, S1, S2.  No murmurs, rubs, or gallops.  GI:  Positive bowel sounds, feeding tube in place.  EXTREMITIES:  No edema.  SKIN:  No jaundice, no lymphadenopathy.  MUSCULOSKELETAL:  The patient moving all 4.  NEURO:  Nonfocal.  PSYCH:  Appropriate.   LABORATORY DATA:  Initial labs include WBC 3.0, hemoglobin 10.3,  hematocrit 30.0, platelets 120, MCV 91.2, RDW 16.0, ANC 71.  The patient  had a PTA of 14.6, INR 1.1, Sodium 130, potassium 3.1, chloride 96,  bicarb 27, glucose 152, BUN 26, creatinine 1.24, total bilirubin 1.0,  alkaline phosphatase 90, AST 50, ALT 44, total protein 6.0, blood  albumin 2.9, and calcium 7.6.   HOSPITAL COURSE:  1. Diabetes with uncontrolled blood sugar as mentioned above in HPI.      The patient was brought in secondary to taking double the dose of      Lantus.  The patient's glipizide and Lantus was held upon coming      into the hospital.  He is monitored with CBG q.  2h. to make sure      that he will not become hyperglycemic.  Of note, the patient's      lowest blood sugar got down to 68 on a B met.  Nutrition consult      was obtained secondary to initiating tube feeds secondary to his      tonsillar carcinoma.  The patient was put on tube feeds per      nutrition recommendations.  Patient's CBG has remained to be      elevated throughout hospital course.  Lantus was increased from 45      all the way up until 260.  The patient was also depending on the      glipizide 10 mg p.o. b.i.d. after initially being held on day one.      The patient was also initially put on sliding scale insulin, which      was DC'd 2 days prior to discharge.  The patient was then given 3      units of NovoLog prior to each tube feeding at 10:00 a.m., 2:00      p.m. and right before his continuous tube feedings overnight.      Prior to discharge, it was discovered that the patient was getting      Jevity tube feeds instead of his Glucerna, which is for diabetics.      As a result, the patient will be cut back on his Lantus back to 50      at discharge.  The patient's NovoLog prior to his tube feeds will      also be DC'd.  We will need to titrate his medication regimen as an      outpatient.  The patient will be DC'd on glipizide 10 mg p.o.      b.i.d. as well as Lantus 50 units subcutaneously nightly.  This      will likely need to be uptitrated secondary to patient's tube      feeds.  This could be done as an outpatient setting.  The patient      is to follow up with Dr. Maple Hudson on April 07, 2008, at 4:00 p.m.  2. Ginette Pitman.  Likely secondary to the patient's chemotherapy.  The      patient will be continued on Diflucan for 4 more days after he is      admitted to hospital.  This should  be reassessed in the outpatient      setting as well.  3. Hypertension.  Well-controlled during hospital.  The patient will      be continued on his benazepril.  4. Gastroparesis.   Well-controlled with the patient's Reglan.  He will      continue this in the outpatient setting.  5. Nausea and vomiting.  Improved likely secondary to the patient's      gastroparesis plus or minus tube feeds.  6. Low magnesium.  The patient's magnesium is 1.5 at the time of      discharge.  The patient will need to be discharged on magnesium      oxide 400 mg p.o. b.i.d.  7. Hyperkalemia.  Initially, the patient was hyperkalemic which      resolved.  The patient's potassium at the time of discharge is      upper level of normal at 5.2.  We will hold his benazepril to      hospital followup.  8. Tonsillar malignancy.  The patient will followup over [facility] today at 3 p.m. to reinitiate his chemotherapy.  This      will be done per Dr. Roselind Messier.   DISCHARGE LABS:  Include magnesium 1.5 prior to being repeated, sodium  132, potassium 5.2, chloride 100, bicarb 25, glucose 352, BUN 15,  creatinine 1.06, calcium 8.4, WBC 4.0, hemoglobin 10.1, platelets 146.   DISCHARGE VITALS:  Were as follows temperature 98.8, pulse 84,  respiratory rate 16, blood pressure 132/81, O2 sats 96% on room air.      Rufina Falco, M.D.  Electronically Signed      Alvester Morin, M.D.  Electronically Signed    JY/MEDQ  D:  03/31/2008  T:  04/01/2008  Job:  [id number]   cc:   Billie Lade, M.D.

## 2011-05-18 NOTE — Discharge Summary (Signed)
[place]. [facility]  Patient:    Eddie [name], Eddie [name]                   MRN: [id number] Adm. Date:  [id number] Disc. Date: [id number] Attending:  Londell Moh Dictator:   Blanch Media, M.D.                           Discharge Summary  DISCHARGE MEDICATIONS:  Pepcid 20 mg p.o. b.i.d.  DISCHARGE DIAGNOSES: 1. Abdominal pain, likely secondary to pancreatitis and hepatitis, alcohol-    induced. 2. Diabetes. 3. Hypokalemia secondary to self-induced vomiting. 4. Hiccups, chronic.  DISCHARGE INSTRUCTIONS:  The patient is to call the Internal Medicine Clinic to schedule a follow-up appointment.  He is not to take his Glucotrol until he is able to eat full meals everyday.  He also not to take his antihypertensive until he is taking oral intake.  HISTORY OF PRESENT ILLNESS:  Eddie [name] is a 70 year old black male patient of Dr. Gwendolyn Fill who presented to the clinic with nausea and vomiting and abdominal pain which was sudden onset on the evening prior to admission.  He had been drinking alcohol previously.  He went to the emergency department, got labs and abdominal series.  He was unable to keep any food down or liquids.  Pain has significantly decreased since the evening prior to admission.  It is located in the left lower quadrant.  He denies any hematemesis, bright red blood per rectum, or melena, or fever, chills, or sweats.  His last alcohol use was 5 p.m. on the evening prior to admission.  He has had no hallucinations and has never been in DTs before.  He has no history of pancreatitis and has been in rehabilitation and AA, but not interested in treatment at this time.  PAST MEDICAL HISTORY: 1. History of alcohol abuse.    a. History of withdrawal seizures.    b. Pancytopenia and macrocytosis. 2. Hepatitis B, C, and alcohol. 3. Diabetes type 2 insulin-dependent. 4. Left inguinal hernia repair in February 10, 2001. 5. History of tuberculosis,  status post treatment for six months in 1997.  MEDICATIONS: 1. Lotensin 10 mg p.o. q.d. 2. Glucotrol XL 5 mg p.o. q.d. 3. Prevacid 30 mg p.o. q.d. 4. Folate 1 mg q.d. 5. Insulin NPH 10 units q.a.m.  ALLERGIES:  No known drug allergies.  FAMILY HISTORY:  Mother is alive with a history of breast cancer and she is age 59.  His father is deceased at age 67, unknown cause.  He has one sister who is alive with no health problems.  SOCIAL HISTORY:  Eddie [name] is a longtime alcoholic that has intermittent sabriety.  Most clinic visits occur while the patient is intoxicated.  The patient lives with his elderly mother.  He is homosexual and has high risk sexual intercourse with known HIV positive men.  His last HIV test was negative in December of 2000.  His social activity is watching TV, talking with friends, and drinking one pint per day.  He does not smoke.  PHYSICAL EXAMINATION:  The patient is not orthostatic.  VITAL SIGNS: Blood pressure 133/94, heart rate 109, temperature 99.1, CBG 180.  HEART: Regular rate and rhythm.  3/6 systolic murmur over the left sternal border.  LUNGS: Faint expiratory wheezes at the bases bilaterally.  ABDOMEN: Nontender throughout.  Positive bowel sounds.  Soft, nontender, and nondistended.  No Murphys sign, no rebound,  and no guarding.  EXTREMITIES: +2 DP pulses bilaterally.  No edema.  LABORATORY DATA:  Sodium 137, potassium 3.7, chloride 98, CO2 29, BUN 8, creatinine 1.0, glucose 218.  Lipase 86, total bilirubin 3.0, alkaline phosphatase 132, AST 121, ALT 107.  White blood cell count 16.2, hemoglobin 12.5, platelets 113, MCV 109.  HOSPITAL COURSE:  #1 - Nausea, vomiting, and abdominal pain.  The patient was admitted to a regular bed, was made NPO and hydrated with IV fluids.  He received a right upper quadrant ultrasound which showed multiple small mobile gallstones within the gallbladder.  Gallbladder is slightly thickened at 2.8 mm in  thickness. Bile duct measures 5.7 mm.  Fatty infiltration of the liver.  The patients examination was normal throughout his hospital stay.  Diet was started back, although, he did not have much of an appetite and was taking about 25% of his meals.  With bowel rest, his labs returned to his baseline.  Even though he was not eating a full diet, his examination was benign and his labs were at baseline and so the patient was discharged to home.  #2 - Alcohol abuse.  This is likely the cause of most of his problems.  The patient was not interested in treatment at this time.  He was encouraged not to continue drinking when discharged.  #3 - Diabetes.  The patient was maintained on low dose NPH throughout his hospital stay to maintain adequate CBGs.  There was concern discharging him on insulin when was not eating at home.  He was encouraged not to continue on his insulin until he was able to eat a regular mea.  #4 - Hiccups.  The patient had retractable hiccups throughout his hospital stay. He was observed many times by the nursing staff putting his finger down his throat to try to stop his hiccups.  He was tried on Thorazine and Baclofen with no great results.  Hiccups had resolved by the time of discharge.  #5 - Hypokalemia.  This was likely secondary to his self-induced vomiting. He received supplementation throughout his hospitalization.  His potassium was 3.1 on the day prior to discharge and he received four runs of KCL with additional oral dosing and his magnesium was also supplemented prior to discharge.  The patient was discharged to home.  He is to follow up with the Internal Medicine Clinic in approximately two weeks. DD:  06/18/01 TD:  06/18/01 Job: [id number]

## 2011-05-18 NOTE — Discharge Summary (Signed)
Eddie [name], Eddie [name]            ACCOUNT NO.:  [id number]   MEDICAL RECORD NO.:  [id number]          PATIENT TYPE:  INP   LOCATION:  5041                         FACILITY:  [facility]   PHYSICIAN:  Peggye Pitt, M.D. DATE OF BIRTH:  07/09/1941   DATE OF ADMISSION:  12/08/2006  DATE OF DISCHARGE:  12/16/2006                               DISCHARGE SUMMARY   DISCHARGE DIAGNOSES:  1. Hiccups, thought to be multifactorial, secondary to gastroparesis,      acute pancreatitis with pseudocyst formation as well as reflux      esophageal with hiatal hernia.  2. Nausea and vomiting, resolved.  3. Diabetes gastroparesis.  4. Hypokalemia.  5. Hypomagnesemia.  6. Type 2 diabetes.  7. History of alcohol abuse.  8. 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  9. History of pancytopenia secondary to bone marrow suppression from      prolonged alcohol abuse.   DISCHARGE MEDICATIONS:  1. Baclofen 10 mg one tab p.o. t.i.d.  2. Folic acid 1 mg p.o. daily.  3. Thymine 100 mg p.o. daily.  4. Protonix 40 mg p.o. b.i.d.  5. Reglan 10 mg one tablet q. a.c. and q.h.s.  6. Mag oxide 400 mg p.o. t.i.d. for approximately 1 week.  7. Lantus 20 units subcutaneously at bedtime once a day.   DISPOSITION AND FOLLOWUP:  Patient is to follow up with Dr. Ardyth Harps at  the [facility] on Thursday, December 20th, at which  time a stat CBC, BMET, and magnesium level will be drawn.  At that time  a decision will be made on whether he needs to continue taking the  magnesium oxide.   IMAGES PERFORMED DURING THIS HOSPITALIZATION:  Include:  1. An acute abdominal series on December 08, 2006, that showed no      evidence of any acute abdominal process.  2. A chest x-ray on December 08, 2006, that showed no effusion or      edema.  3. A CT of the abdomen on December 09, 2006, that was consistent with      no evidence of acute pancreatitis.  Several low-density fluid      collections  throughout the liver and within the gastrohepatic      ligament.  The differential includes pseudocyst versus abscess,      hiatal hernia, probable cirrhosis, gallstones, and a low-density      focused within the tail of the pancreas that might represent a      pseudocyst formation.  Also showed calcifications within the      pancreatic parenchyma consistent with chronic pancreatitis.  The      patient also had a gastric-emptying study on December 13, 2006,      that was abnormal with 59% remaining at 1 hour with normal being      50% or less and 41% remaining at 2 hours with normal being 25% or      less.  4. Patient also had an EGD that showed severe reflux esophagitis with      a large hiatal hernia.  Biopsies were taken that showed benign  squamous mucosa with mild inflammation with no evidence of      metaplasia, dysplasia, or malignancy.  Stain for H. pylori is      pending at time of this dictation.   HISTORY AND PHYSICAL EXAMINATION:  For full details, please refer to the  chart, but Eddie [name] is a 70 year old African-American man who was  recently discharged on December 04, 2006, which was an admission for  acute pancreatitis, and he returned complaining of hiccups since his  discharge that did not seem to be alleviated.   HOSPITAL COURSE BY ACTIVE PROBLEM:  Problem:  1. His hiccups seem to be improving on a regimen of baclofen.  We will      send him on 10 mg p.o. t.i.d.  Several imaging studies were      performed to determine the etiology of the hiccups, and it was      determined that he had at least 3 which could have been the reflux      esophagitis with the severe hiatal hernia, could have also been      secondary to gastroparesis as well as could have been secondary to      irritation of the diaphragm and subsequently the phrenic nerve by      pseudocyst formation from his pancreatitis.  At time of discharge,      hiccupping is much improved, and we will follow up  with this as an      outpatient.  2. His hypokalemia and hypomagnesemia.  His potassium is normal at      time of discharge.  Magnesium is low at 1.2.  We will continue on a      regimen of mag ox 400 mg p.o. t.i.d., and at his hospital followup,      he will get a stat magnesium level, and we will see if this needs      to be continued or not.  3. His type 2 diabetes.  He had altered his home Lantus regimen, and      we will send him home on 20 units of Lantus q.h.s.  He has been      instructed to monitor his blood sugars, and we will make      adjustments as needed as an outpatient.  As far as his diabetic      gastroparesis, it was diagnosed during this hospitalization with an      abnormal gastric-emptying study, we will send him home on Reglan 10      mg p.o. daily before each meal and at bedtime.  4. For his reflux esophagitis and hiatal hernia, he will be placed on      a regimen of b.i.d. Protonix.  5. All other chronic medical issues were stable during      hospitalization.   VITAL SIGNS ON DAY OF DISCHARGE:  His temperature is 98.3.  His heart  rate is 20.  His O2 sat is 96% on room air.  His blood pressure is  154/92.   LABS ON DAY OF DISCHARGE:  Show a sodium of 138, potassium 4.1, chloride  111, bicarb of 25, BUN 1, creatinine of 0.9, glucose of 184, and a  calcium of 8.4.  His magnesium is 1.2.      Peggye Pitt, M.D.  Electronically Signed     EH/MEDQ  D:  12/16/2006  T:  12/17/2006  Job:  [id number]   cc:   Hedwig Morton. Juanda Chance, MD

## 2011-05-18 NOTE — Discharge Summary (Signed)
[place]. [facility]  Patient:    Eddie [name], Eddie [name] Visit Number: [id number] MRN: [id number]          Service Type: MED Location: [id number] Attending Physician:  Madaline Guthrie Dictated by:   Hillery Aldo, M.D. Admit Date:  06/06/2002 Discharge Date: 06/09/2002   CC:         Outpatient Clinic, Dr. Jenne Pane   Discharge Summary  DISCHARGE DIAGNOSES:  1. Abdominal pain with nausea and vomiting.  2. Chronic pancreatitis.  3. Hypokalemia.  4. Dehydration.  5. Hiccups.  6. Anemia.  7. Insulin-dependent diabetes mellitus.  8. Alcohol abuse.  9. History of hepatitis B. 10. History of hepatitis C. 11. Benign prostatic hypertrophy.  DISCHARGE MEDICATIONS: 1. Lotensin 10 mg p.o. q.d. 2. Glucotrol XL 5 mg p.o. q.d. 3. Pepcid 20 mg p.o. q.d. 4. Folic acid 1 mg p.o. q.d. 5. NPH Insulin 10 units p.o. q.a.m. 6. Thorazine 10 mg p.o. q.6h. p.r.n. hiccups.  HISTORY OF PRESENT ILLNESS:  The patient is a 70 year old black male with multiple recent admissions for chronic pancreatitis flares, most recently discharged on May 30, 2002, for same and readmitted to [facility] on June 05, 2002, by Stacie Glaze, M.D. [facility] for management of orthostatic hypotension secondary to dehydration, hyperkalemia (questionable hemolysed specimen), hyponatremia, and chronic pancreatitis.  The patient reportedly began hiccuping the evening of discharge and had two syncopal episodes at home on that same day.  He then began vomiting on June 04, 2002, was seen in the emergency department and sent home, but returned the following day unable to keep food down secondary to nausea and vomiting.  His initial evaluation revealed orthostasis, hyponatremia, and hyperkalemia (potassium originally 8.2, 5.8 on repeat).  An acute abdominal series did not suggest obstruction.  His original lipase was 535.  He was admitted, given IV fluid hydration, kept NPO, given insulin,  thiamine and folate, and was transferred to [facility] on June 07, 2002, when a bed became available since he is followed by the outpatient clinic medicine teaching services. Initial laboratory values revealed a sodium of 131, potassium 3.7, chloride 101, bicarb 28, glucose 162, BUN 10, creatinine 0.9, calcium 8.5.  CBC showed a white blood cell count of 7.3, hemoglobin 11.4, hematocrit 34.0, platelet count 181.  PROCEDURE:  Chest x-ray, June 05, 2002; no acute abnormality.  HOSPITAL COURSE:  #1 - Orthostatic hypotension.  The patient was admitted and received aggressive IV fluid rehydration.  His orthostasis resolved and at the time of discharge has not had any further problems.  #2 - Hyperkalemia.  This resolved and the patient became hypokalemic requiring potassium supplementation prior to discharge.  It is unclear as to whether his therapy with Lotensin may have been responsible.  No concommitant EKG was obtained at the time of his hyperkalemic episode.  Will cautiously restart Lotensin and have the patient return to the clinic on Friday for a repeat BMP. If his potassium is elevated at that time, he will be discontinued off of ACE inhibitor therapy permanently.  #3 - Chronic pancreatitis.  The patients initial liver function tests did not indicate a cholestasis issue.  He has known alcohol abuse.  He was kept NPO except for medications and resolved his nausea and vomiting after 24 hours of supportive care.  He gradually restarted his diet and the patient was able to tolerate this without any difficulty.  His diet was advanced to regular and he was tolerating this x24  hours at the time of his discharge.  #4 - Alcohol dependence.  The patient did not exhibit any signs of withdrawal, although, he had p.r.n. Ativan as needed.  He was supplemented with thiamine and folate.  #5 - Hypertension.  His Lotensin was held throughout the course of his hospitalization.  At  the time of his discharge, his blood pressures have been in the systolic 130 range.  He will be restarted on his outpatient regimen and followed up closely to ensure that his Lotensin is not causing any problems with potassium.  #6 - Diabetes.  The patient was restarted on his home regimen of insulin and will follow up at the outpatient clinic with Dr. Jenne Pane for management of his chronic medical problems.  #7 - Anemia.  There was a hemoglobin drop from 11.4 to 9.7 after fluid rehydration.  His baseline hemoglobin tends to be in the 10 range.  He did have stools that were guaiac positive and will need further workup of this as an outpatient.  I will talk to Dr. Jenne Pane about scheduling the patient for colonoscopy at some point in the future.  DISCHARGE INSTRUCTIONS:  The patient was instructed to resume activity as tolerated.  The patient is to resume his diet as tolerated.  He understands that he should return to the physicians office for any recurrence of nausea, vomiting, or abdominal pain.  He has a follow-up appointment scheduled on Friday, June 13, with Dr. Darnelle Catalan to obtain a basic metabolic panel to check his potassium.  If his potassium is elevated, we will permanently discontinue him off any ACE inhibitor therapy. Dictated by:   Hillery Aldo, M.D. Attending Physician:  Madaline Guthrie DD:  06/09/02 TD:  06/11/02 Job: [id number]

## 2011-05-18 NOTE — Discharge Summary (Signed)
NAME:  Eddie [name], Eddie [name]                      ACCOUNT NO.:  [id number]   MEDICAL RECORD NO.:  [id number]                   PATIENT TYPE:  INP   LOCATION:  4729                                 FACILITY:  [facility]   PHYSICIAN:  Fransisco Hertz, M.D.               DATE OF BIRTH:  1941/02/26   DATE OF ADMISSION:  03/11/2004  DATE OF DISCHARGE:  03/13/2004                                 DISCHARGE SUMMARY   DISCHARGE DIAGNOSES:  1. Orthostatic hypotension, etiology unknown with resulting dizziness.  2. Diabetes type 2.  3. Anemia secondary to upper gastrointestinal erosions.  4. Hepatitis B and C.   DISCHARGE MEDICATIONS:  1. Lactulose 30 ml to take p.o. b.i.d.  2. Folic acid 1 mg taken in form of multivitamin.  3. Protonix 40 mg p.o. daily.  4. Insulin ? NPH 40 units q.a.m..  5. Lotensin 10 mg p.o. daily.  6. Glucotrol XL 5 mg p.o. daily.  7. Neurontin 300 mg p.o. t.i.d.   CONDITION ON DISCHARGE:  Improved.   PROCEDURES PERFORMED:  March 11, 2004 patient had a portable chest x-ray  that showed no acute disease. Also on March 11, 2004 the patient had  portable abdominal x-ray that showed no acute process seen in the abdomen.  March 13, 2004 the patient had an esophagogastroduodenoscopy performed which  showed esophageal inflammation, antral erosions, stricture in the distal  esophagus and hiatal hernia with recommendations to start Protonix 40 mg  daily.   CONSULTATIONS:  Barbette Hair. Arlyce Dice, M.D. [facility], gastroenterology.   HISTORY OF PRESENT ILLNESS:  Please see the patient's chart for full  details.  In brief, the patient is a 70 year old African-American male  admitted secondary to one day of dizziness.  Patient awoke early the day of  admission, drank half a pint of wine, took some insulin and then felt dizzy.  He proceeded to the grocery store where he continued to feel dizzy.  He went  to the bathroom and laid down on the floor, felt better and then returned  home.  Once he  returned home he went and laid down on his bed for about two  hours still feeling dizzy after the two hours so he called EMS.  As far as  his dizziness he characterized this as feeling weak  but no vertigo, no loss  of consciousness, no visual changes, no fever, no chest pain, no cough, no  dyspnea.   PHYSICAL EXAMINATION:  VITAL SIGNS: Temperature 98.0, pulse 90, respiratory  rate 18, blood pressure 70/47, after two liters increased to 122/70 with  oxygen saturation greater than 90's on room air.  GENERAL:  In no apparent distress.  HEENT:  Eyes sclerae clear. Pupils equal, round, reactive to light and  accommodation.  Muddy sclerae.  ENT:  Oropharynx is clear, moist mucosa.  NECK:  Supple.  RESPIRATORY:  Clear to auscultation bilaterally.  No crackles, wheezes or  rhonchi.  CARDIOVASCULAR:  Regular rate and rhythm with normal S1, S2. No murmurs,  rubs or gallops.  GASTROINTESTINAL:  Positive bowel sounds, no hepatosplenomegaly.  Soft,  nontender, nondistended.  EXTREMITIES:  2+ pulses X4.  Skin is warm and dry.  No lymphadenopathy.  MUSCULOSKELETAL:  Moves all extremities.  NEUROLOGICAL:  Grossly intact.  PSYCHIATRIC:  Patient is appropriate.   LABORATORY DATA:  On admission sodium was 135, potassium 3.8, chloride 102,  cO2 22, BUN 18, creatinine 1.5, glucose 143. White blood cell count 6.6,  hemoglobin of 12.7, platelet count 98,000 with MCV 99.4, ANC 7.8.  He was  fecal occult blood test positive, gastric occult blood positive as well.  Chest x-ray results as above. Liver enzymes showing ALP of 181, bilirubin  1.0, AST 146, ALT 143, protein 6.8, albumin 2.8, calcium 8.2.   HOSPITAL COURSE:  PROBLEM #1:  DIZZINESS/PRESYNCOPAL EPISODE:  The etiology  of this is unknown.  Not quite sure why the patient had low blood pressure  on admission.  He responded well to fluid boluses and maintained his blood  pressure's throughout the hospital course.  The differential diagnosis on   admission remained possibly secondary to volume depletion, possibly illicit  drugs, however, his urine drug screen was ordered and not performed so  illicit drugs could not be completely ruled out.  Also performed was an  alcohol level which showed less than 5%.  Cardiac enzymes were checked and  were found to be normal.  TSH was checked and was found to be within normal  limits of 0.8. The most likely cause at the time of discharge was felt to be  vasovagal which explains why the patient responded to fluid bolus.  The  patient's cardiac function was not assessed with a 2-dimensional  echocardiogram and if this were to recur it would be worth while to check an  outpatient echocardiogram to assess cardiac function.  Patient was admitted  and monitored via telemetry with no abnormalities and it was not felt the  patient required this during inpatient stay but could be followed up.   PROBLEM #2:  ANEMIA:  Patient was admitted with hemoglobin of 12.7.  His  hemoglobin level remained stable at 12 throughout the course, however, he  was found to be fecal occult blood and gastric occult testing positive.  The  etiology of this was unknown, therefore, gastroenterology was consulted.  Dr. Arlyce Dice saw the patient and recommended initially that the patient may  need esophagogastroduodenoscopy and colonoscopy to determine the cause.  He  proceeded with esophagogastroduodenoscopy and found the likely course to be  the antral erosions as well as the other findings mentioned as above and  therefore it was deemed not necessary to proceed with a colonoscopy.  The  patient was started on a proton pump inhibitor and H pylori testing was also  performed which was found to be negative and therefore no further  eradication medication was necessary for this, just to treat him with the  proton pump inhibitor.  It is anticipated that the patient's hemoglobin level will rise, however, if the patient remains at 12 or  begins to drop his  hemoglobin it would probably be worth while to start the patient on iron  therapy as well as proceed with possible colonoscopy to rule out an  additional source of blood loss in the gastrointestinal tract.   PROBLEM #3:  DIABETES MELLITUS TYPE 2: Patient was admitted and was started  on sliding scale insulin for his short stay.  His hemoglobin A1C was checked  during this hospitalization and was found to be 12.3 which is elevated.  Patient was instructed to consistently take his insulin which is supposed to  be 40 units q.a.m. and to follow up with his primary care physician for  better and tighter control of his glucose.   PROBLEM #4:  ABNORMAL LIVER FUNCTION TESTS:  Patient has known hepatitis C  and gastroenterology was consulted with their recommendations to check alpha  fetoprotein level which was found to be elevated at 11.8.  It was there  recommendation that the patient have a follow up abdominal ultrasound to  rule out hepatoma which can occur with cirrhotic liver disease, especially  hepatitis C and it was recommended that the patient receive an abdominal  ultrasound on an outpatient basis.   DISCHARGE LABORATORY DATA:  As mentioned above, the H pylori test was  negative.  Alpha fetoprotein was 11.8, human immunodeficiency virus was  nonreactive. Hemoglobin A1C was 12.3.   FOLLOW UP:  The patient is to follow up with Dr. Zetta Bills [facility] on April 18, 2004 at 8:30 A.M.  At that time it  is recommended that the patient get a CBC to assess anemia and also a CBG to  assess his diabetes control.  If the patient were to have outpatient  continued dizziness an outpatient echocardiogram and also an abdominal  ultrasound to evaluate the possibility of a hepatoma with increased liver  function tests was recommended.      Foye Clock, MD                         Fransisco Hertz, M.D.    JH/MEDQ  D:  03/17/2004  T:   03/20/2004  Job:  [id number]   cc:   Dr. Allena Katz, Outpatient Clinic   Barbette Hair. Arlyce Dice, M.D. [facility]

## 2011-05-18 NOTE — H&P (Signed)
[facility]  Patient:    Eddie [name], Eddie [name] Visit Number: [id number] MRN: [id number]          Service Type: MED Location: [id number] [id number] Attending Physician:  Donnetta Hutching Dictated by:   Dianah Field, P.A.-C. Admit Date:  12/22/2001                           History and Physical  DATE OF BIRTH:  November 26, 1941  PRIMARY PHYSICIAN:  Marene Lenz, M.D., of the [facility] internal medicine teaching service.  CHIEF COMPLAINT:  Upper abdominal pain with some nausea and scant emesis.  HISTORY OF PRESENT ILLNESS:  Eddie [name] is a 70 year old alcoholic who continues to actively drink despite several episodes of acute pancreatitis requiring admission.  All of these prior admissions have been over at [facility].  For some reason, he decided to come to [facility] emergency room where he was an unassigned patient.  He was picked up and admitted by Dr. Melvia Heaps for flare of acute, relapsing alcoholic pancreatitis.  He admits to drinking two days ago.  He was just admitted December 15 through December 17 to the teaching service because of acute pancreatitis.  The pain is primarily in his upper abdomen.  He has anorexia associated with this.  He is nauseous and throwing up mostly foamy phlegm-type material but no blood, coffee ground, or bilious material.  He has never had any GI workup per perusal of prior records.  Patient is an insulin-requiring diabetic.  Patient actually went to the emergency room earlier in the day on December 21. He was complaining of weakness for three days along with the abdominal pain and nausea.  He was also complaining of hiccups.  Labs did reveal some hyponatremia and elevated blood glucose.  He ended up being sent home.  He had also visited the emergency room on December 19.  At that time, he was complaining of hiccups.  He received therapy of Haldol for this.  Patient then came over to [facility] later in the  day on December 23 and a CT scan of the abdomen was obtained.  This showed acute pancreatitis involving the head of the pancreas, markedly increased in size relative to prior CT of March 2002.  There was call of a possible stone in the distal common bile duct along with cholelithiasis but no evidence for acute cholecystitis.  He also had two pseudocysts in the lesser sac of the pancreas which were new since the March 2002 CT scan.  The gastric antrum and pylorus were thickened possibly secondary to the pancreatitis but also differential included peptic ulcer disease.  Incidental scattered sigmoid diverticula were noted along with a small left inguinal hernia.  In any event, there was some concern that this may not just be alcoholic pancreatitis, that he may have a common duct stone causing biliary pancreatitis and, therefore, Dr. Arlyce Dice was called in and did end up admitting him.  However, on reviewing the CT scan, Dr. Arlyce Dice felt that this was not biliary pancreatitis but simply recurrence of acute alcoholic pancreatitis.  CURRENT MEDICATIONS: 1. Lotensin 10 mg p.o. q.d. 2. Glucotrol XL 5 mg p.o. q.d. 3. Pepcid 20 mg p.o. b.i.d. 4. Folic acid 1 mg p.o. q.d. 5. Insulin, NPH, 10 units subcutaneously q.a.m.  ALLERGIES:  No known drug allergies.  PAST MEDICAL HISTORY: 1. Type 2 insulin-requiring diabetes mellitus. 2. Hypertension. 3. Acute relapsing alcoholic  pancreatitis. 4. Alcoholism, patient unable to maintain any sobriety.  Was last treated for    outpatient alcohol rehab about 10 years ago. 5. History of seizure disorder secondary to alcohol abuse. 6. History of hepatitis B and C. 7. BPH for which he is status post TURP procedure. 8. Status post left inguinal hernia repair.  FAMILY HISTORY:  Mother is alive and has a history of breast cancer.  She is approximately 31 years old.  Father died at age 71, but the cause is unknown. He has one sister who is alive and has no known  health problems.  SOCIAL HISTORY:  Patient is a long time alcoholic.  He admits to drinking about a pint and a half of [employer] daily.  Patient is often intoxicated during clinic visits per an old history.  Patient lives with his elderly mother.  Patient is homosexual but denies current sexual activity.  He was HIV negative on testing in December 2000.  He has never been a smoker.  REVIEW OF SYSTEMS:  NEUROLOGIC:  No focal weakness though generally weak and lacking in energy.  GENERAL:  He has had some weight loss but the exact amount is not known.  He is moving his bowels infrequently because of poor p.o. intake but denies any melena or bleeding per rectum.  He denies heartburn and dysphagia.  He does have some urinary hesitancy but no dysuria. MUSCULOSKELETAL:  Denies chronic back pain.  He denies use of nonsteroidal anti-inflammatories.  PULMONARY:  Denies cough or shortness of breath.  PHYSICAL EXAMINATION:  GENERAL:  Patient is a thin and chronically ill-appearing African-American male.  He is alert and oriented x 3.  He does not have good insight into his medical problems.  VITAL SIGNS:  Blood pressure 114/84, pulse 120, respirations 18, temperature 97.1.  HEENT:  Sclerae are nonicteric, conjunctivae are pink, extraocular movements are intact.  Oropharynx and oral mucosa is moist.  There are no lesions or exudates.  NECK:  There are no masses, thyromegaly, or JVD.  CHEST:  Clear to auscultation and percussion bilaterally, but the breath sounds are decreased at the bases.  There is no cough with deep inspiration.  COR:  Rhythm is tachycardic but regular.  No murmurs, rubs, or gallops.  ABDOMEN:  Soft with some mild periumbilical tenderness.  No masses or hepatosplenomegaly.  No hernias.  RECTAL/GU:  Exams were deferred.  NEUROLOGIC:  There is no tremor, no asterixis.  The grip strength is 5/5 bilaterally.  Ankle flexion and extension is 5/5  bilaterally.  LABORATORY DATA:  Sodium 127, potassium 4.7, chloride 86, CO2 33, glucose 229.  BUN 11, creatinine 1.0.  Total bilirubin 1.2, alkaline phosphatase 79, SGOT 44, SGPT 35, albumin 2.8.  White blood cell count 12, hemoglobin 13.4, hematocrit 38.3, MCV 103.6, platelets 161.  CK total 214, CK-MB 1.9.  Troponin I 0.01.  A CT scan obtained, see report above.  IMPRESSION: 1. 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 2. 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 3. Hyponatremia. 4. Insulin-requiring type 2 diabetes mellitus. 5. History of hypertension.  PLAN:  Patient admitted to the service of Dr. Melvia Heaps for IV fluids, bowel rest, and p.r.n. analgesics and antinauseals.  Need to watch for alcohol withdrawal given the history of alcoholic seizures.  Also, will try to get the patient transferred over to [facility] where he can be followed by his regular internal medicine doctor, Dr. Marene Lenz and/or the internal medicine teaching residents  covering hospitalized patients. Dictated by:   Dianah Field, P.A.-C. Attending Physician:  Donnetta Hutching DD:  12/23/01 TD:  12/23/01 Job: [id number]

## 2011-05-18 NOTE — Discharge Summary (Signed)
NAMEJAYSIN, Eddie [name]            ACCOUNT NO.:  [id number]   MEDICAL RECORD NO.:  [id number]          PATIENT TYPE:  INP   LOCATION:  6731                         FACILITY:  [facility]   PHYSICIAN:  Fransisco Hertz, M.D.  DATE OF BIRTH:  04/20/1941   DATE OF ADMISSION:  03/25/2007  DATE OF DISCHARGE:  03/27/2007                               DISCHARGE SUMMARY   DISCHARGE DIAGNOSES:  1. Persistent hiccups - resolved.  2. 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  3. Hepatitis C.  4. Chronic pancreatitis.  5. Asymptomatic chololithiasis.  6. Alcohol abuse.  7. Hypertension.  8. Type 2 diabetes.  9. Diabetic gastroparesis.  10.History of latent tuberculosis status post treatment in 1997.  11.Status post left inguinal hernia repair in November of 2000.  12.Status post transurethral resection of the prostate.   DISCHARGE MEDICATIONS:  1. Glipizide ER 10 mg p.o. q.a.m.  2. Lantus insulin 25 units subcutaneous q.h.s.  3. Omeprazole 25 mg p.o. daily.  4. Benazepril 10 mg p.o. daily.  5. Baclofen 10 mg p.o. t.i.d.  6. Reglan 10 mg p.o. t.i.d. a.c. and q.h.s.  7. Thorazine 25 mg p.o. q.8 h p.r.n. hiccups.  8. Multivitamin one tab p.o. daily.   DISPOSITION AND FOLLOWUP:  At the time of discharge, the patient was in  good condition.  His hiccups had resolved and he was not complaining of  any nausea, vomiting or abdominal pain.  He was tolerating a full diet  without difficulty.  He is scheduled for a followup appointment with Dr.  Maple Hudson in the Outpatient Clinic on April 17 at 3:15 p.m.  At that time,  the patient's hiccups should be readdressed, as well as any abdominal  symptoms that could warrant electively cholecystectomy.   PROCEDURES PERFORMED:  Complete abdominal ultrasound:  This was  performed on March 26, 2007 and revealed chololithiasis and diffuse  gallbladder wall thickening.  This was felt to possibly be due to  chronic cholecystitis, hypoproteinemia or  hepatitis.  Diffuse atrophy of  the pancreas was seen again.  These findings are consistent with  previous imaging of the abdomen, including an abdominal ultrasound from  May 15, 2005.   CONSULTATIONS:  None.   BRIEF ADMITTING HISTORY AND PHYSICAL:  The patient is a 70 year old male  with a history of cirrhosis secondary to hepatitis C and alcohol abuse,  as well as type 2 diabetes, who has been hospitalized in the past for  recurrent hiccups secondary to gastroparesis and acute pancreatitis with  pseudocyst formation.  He presented to the emergency department  complaining of hiccups that began two to three days prior to admission.  He reports that he had had an alcohol binge about four days before  presentation, during which time he drank 24 cans of beer and more than  one gallon of wine.  Shortly thereafter he developed nausea and vomiting  that was non-bloody, as well as unrelenting hiccups.  He denies having  any abdominal pain, chest pain, dyspepsia, dysphagia, odynophagia,  diarrhea, constipation, hematochezia or melena.  Of note, the patient  reports that he may have lost  ten pounds since his alcohol binge four  days ago.  However, he denies having fevers, chills or night sweats.   PHYSICAL EXAMINATION:  VITALS:  Temperature 97.5, blood pressure 122/74,  pulse 109, respirations 20, oxygen saturation 99% on room air.  GENERAL:  Patient is a well-developed, well-nourished man in no acute  distress.  HEENT:  Pupils are equal, round and reactive to light and accommodation.  Extraocular eye movements are intact.  Sclerae are icteric without  xanthelasma.  Oropharynx is clear.  The patient is edentulous with  sublingual phrenic jaundice.  Small ulcerations are noted on both sides  of the tongue base.  NECK:  Supple without lymphadenopathy or thyromegaly.  RESPIRATORY:  Lungs are clear to auscultation bilaterally with good air  movement.  CARDIOVASCULAR/CHEST:  Patient has a regular  rate and rhythm without  murmurs, rubs or gallops.  No carotid bruits are present.  No  gynecomastia is noted.  ABDOMEN:  Normoactive bowel sounds are present.  The abdomen is mildly  distended without tenderness, rebound or guarding.  No spider angiomata  or caput medusae are present.  Ascites is not noted.  EXTREMITIES:  Trace ankle edema and lower extremity varicosities are  present.  No cyanosis or clubbing is noted.  SKIN:  No rashes or other lesions are present.  The skin is warm and  dry.  NEURO:  The patient's cranial nerves II-XII are intact.  He has 5/5  strength in his upper and lower extremities bilaterally.  Light touch to  sensation is normal and symmetric in his upper and lower extremities  bilaterally.  Deep tendon reflexes are trace, but symmetric bilaterally.  He has normal cerebellar function.  PSYCH:  The patient is alert and oriented x3.  He has appropriate mood  and affect.   ADMISSION LABS:  White blood cell count 7.8, hemoglobin 12.5, platelets  175, ANC 5.4, MCV 87.0, RDW 14.8.  Sodium 134, potassium 3.8, chloride  96, bicarbonate 29, BUN 15, creatinine 1.1, glucose 270, anion gap 9.  Total bilirubin 9.0, alkaline phosphatase 125, AST 86, ALT 64, total  protein 8.3, albumin 3.3, calcium 9.4.  PT 15.3, INR 1.2, lipase 26.   HOSPITAL COURSE:  1. Persistent hiccups:  The patient reports having hiccups that have      not resolved spontaneously for more than two days.  He has had this      problem in the past and was treated with a combination of Reglan      and baclofen.  It was felt that his hiccups were secondary to      gastroparesis with superimposed pancreatitis and a pseudocyst.      However, he had been doing well up until the three to four days      prior to admission.  The temporal relationship of the onset of      hiccups to his alcohol binge and subsequent nausea and vomiting      suggests that he may have developed mild pancreatitis or gastritis      with his baseline gastroparesis.  However, his lipase was found to      be normal.  He was started on a combination of Reglan and baclofen      with moderate improvement in his hiccups.  Additionally, as-needed      Thorazine was used with good results.  The patient was provided      prescriptions for all three of these and will be followed up in the  outpatient clinic.  No evidence of enlarging pseudocyst or      pancreatitis was seen on ultrasound or laboratory studies.  2. Cirrhosis:  Eddie [name] has a history of hepatic cirrhosis secondary      to hepatitis C infection and chronic alcohol abuse.  At the time of      admission, his liver function tests were notable for a markedly      elevated bilirubin and mild elevations in alkaline phosphatase, AST      and ALT.  The AST/ALT split was consistent with his recent alcohol      abuse.  However, the elevated bilirubin and alkaline phosphatase      were suggested of a biliary etiology.  The patient has known      chololithiasis and we were concerned that he may have developed      obstruction of a biliary duct.  However, no evidence of this was      noted on abdominal ultrasound, and the patient had no symptoms to      suggest cholecystitis or cholangitis.  The patient's      hyperbilirubinemia was found to be a mixture of direct and      indirect.  However, this trended downward on the day following      admission and should be followed up per routine.  If the patient      develops any significant abdominal pain or recurrent nausea or      vomiting, particularly if this is associated with meals, he should      be referred for elective cholecystectomy given his known history of      chololithiasis.  3. Chronic pancreatitis:  The patient has a history of chronic      pancreatitis, likely due to longstanding alcohol abuse.  On      admission, he did not have any signs or symptoms to suggest acute      pancreatitis and his lipase was  within normal limits.  He was able      to tolerate a regular diet without difficulty and did not have any      signs or symptoms to suggest mild absorption that would require      replacement of pancreatic enzymes.  The patient was counseled on      the risks of continued alcohol abuse and was strongly encouraged to      refrain from consuming any alcohol in the future.  4. Alcohol abuse:  As noted above, the patient has a long history of      alcohol abuse and a recent alcohol binge.  I have spoken with him      at length regarding the risks of continuing to drink alcohol      including worsening of his cirrhosis and pancreatitis.      Additionally, it appears that alcohol plays a role in the      development of his hiccups.  He has been provided with contact      information for Alcoholics Anonymous and was strongly encouraged to      contact them in order to get assistance with staying sober.  5. Diabetes:  The patient has a history of poorly controlled diabetes     with a hemoglobin A1c of 11.0 at the time of admission.  He was      initially started on Lantus and sliding-scale insulin, but was      transitioned back to his home regimen of  Lantus and glipizide.      However, because of his markedly elevated hemoglobin A1, his Lantus      was increased by 5 units to 25 units daily.  I have spoken with the      patient at length about the importance of continuing to control his      blood sugars closely.  When he returns for followup in the      outpatient clinic, this should be readdressed.  Given his very poor      control and significant pancreatitis, it is quite possible that he      may need to be transitioned to an all-insulin regimen in order to      control his diabetes more effectively.  6. Gastroparesis:  The patient has a history of gastroparesis      documented on gastric-emptying study.  It is likely that gastric      distension secondary to this exacerbates his hiccups.   Of note, the      patient finds that self-induced vomiting relieves his hiccup      symptoms, which is consistent with a gastroparesis etiology for his      hiccups.  He was given a prescription for Reglan and was asked to      take this scheduled before meals, as well as at bedtime.  Further      evaluation and management will be deferred to the outpatient      clinic.   DISCHARGE LABS:  White blood cell count 5.4, hemoglobin 11.3, platelets  149.  Reticulocyte 1.6%.  Sodium 137, potassium 3.3 (repleted before  discharge), chloride 103, bicarbonate 26, BUN 11, creatinine 0.8,  glucose 125.  Total bilirubin 5.5, alkaline phosphatase 106, AST 77, ALT  53, total protein 6.8, albumin 2.6, calcium 9.0.  LDH 126, hemoglobin  A1c 11.0, ferritin 94, AFT 8.5, haptoglobin 91.   DISCHARGE VITALS:  Temperature 97.9, blood pressure 116/73, pulse 78,  respirations 20, oxygen saturation 97% on room air.      Yvonne Kendall, M.D.  Electronically Signed      Fransisco Hertz, M.D.  Electronically Signed   CE/MEDQ  D:  03/27/2007  T:  03/27/2007  Job:  [id number]   cc:   Rufina Falco, M.D.

## 2011-05-18 NOTE — Discharge Summary (Signed)
   NAME:  Eddie [name], Eddie [name] NO.:  [id number]   MEDICAL RECORD NO.:  [id number]                    PATIENT TYPE:   LOCATION:                                       FACILITY:   PHYSICIAN:  Blanch Media, M.D.             DATE OF BIRTH:  1941/05/03   DATE OF ADMISSION:  05/24/2002  DATE OF DISCHARGE:  05/30/2002                                 DISCHARGE SUMMARY   DATE OF ADMISSION:  05/24/02   DATE OF DISCHARGE:  05/30/02   DISCHARGE DIAGNOSES:  1. Pancreatitis.  2. Diabetes.  3. Hypertension.   DISCHARGE MEDICATIONS:  1. Pepcid 20 b.i.d.  2. Lotensin 20 q.d.  3. Glucotrol XL 5 q.d.  4. NPH 20 q.d.  5. Neurontin 300 t.i.d.   FOLLOWUP:  In the Internal Medicine Clinic.   HISTORY OF PRESENT ILLNESS:  [name] is a 70 year old, black male with a  long history of recurrent alcoholic pancreatitis, pseudo cyst, diabetes, and  hypertension.  The patient had a four day history of increasing abdominal  distention, anorexia, and hiccups.   PHYSICAL EXAMINATION:  VITAL SIGNS:  Temperature 96.7, blood pressure  131/100, heart rate 123-150, respiratory rate 24.  PERTINENT POSITIVES AND NEGATIVE ON THE PHYSICAL EXAM:  Abdominal  distention, positive bowel sounds, soft, positive tenderness throughout the  epigastrium.   ADMITTING LABS:  White blood cells 11.2, hemoglobin 16.0, platelets 94,  lipase 535, sodium 127, potassium 5.8, chloride 92, CO2 25, BUN 33,  creatinine 1.7, glucose 419, ALT 19, AST 56, alk phos 105, total bili 2.5.  Abdominal films showed nonspecific bowel gas.   HOSPITAL COURSE:  1. Pancreatitis.  Patient was admitted to a regular bed.  He was n.p.o.  IV     fluids were started.  Morphine for pain control was begun.  Patient had a     decrease in his pain and was able to take solid foods prior to discharge.  2. Diabetes.  Patient's insulin was initially only sliding scale insulin as     he was n.p.o.  Once he was able to take  solid foods, he was put back on his home medications.  3. Hypertension.  His hypertensive medicines were initially held but were re-     started when he was able to take orals.                                               Blanch Media, M.D.    EB/MEDQ  D:  12/16/2002  T:  12/16/2002  Job:  [id number]

## 2011-05-18 NOTE — Discharge Summary (Signed)
NAMEEDISON, NICHOLSON            ACCOUNT NO.:  [id number]   MEDICAL RECORD NO.:  [id number]          PATIENT TYPE:  INP   LOCATION:  5731                         FACILITY:  [facility]   PHYSICIAN:  Alvester Morin, M.D.  DATE OF BIRTH:  [birth year]   DATE OF ADMISSION:  04/12/2005  DATE OF DISCHARGE:  04/12/2005                                 DISCHARGE SUMMARY   PRIMARY CARE PHYSICIAN:  Zetta Bills, M.D., Outpatient Clinic.   DISCHARGE DIAGNOSES:  1.  Nausea and vomiting secondary to an alcoholic binge.  2.  Hypotension secondary to hypovolemia.  3.  Acute renal failure secondary to hypovolemia.  4.  Hypokalemia secondary to vomiting.  5.  Elevated liver function tests secondary to alcoholic hepatitis.  6.  Hyperglycemia secondary to medication noncompliance.   PAST MEDICAL HISTORY:  1.  Hypertension.  2.  Type 2 diabetes mellitus but now insulin dependent.  3.  Chronic pancreatitis secondary alcohol use diagnosed in 2003.  4.  Hepatitis C.  5.  Cirrhosis secondary to alcohol use and hepatitis C.  6.  BPH.  7.  Pancytopenia secondary to alcohol.  8.  Tuberculosis, treated for 6 months in 1997.  9.  Left inguinal hernia repair in November 2000.  10. Alcohol abuse with history of DTs in the past.   DISCHARGE MEDICATIONS:  1.  Benazepril 10 mg p.o. daily.  2.  Protonix 40 mg p.o. b.i.d.  3.  Thiamine 100 mg p.o. daily.  4.  Folate 1 g p.o. daily.  5.  Potassium 20 mEq p.o. daily.  6.  Aspirin 81 mg p.o. daily.  7.  Insulin 70/30, 30 units q.a.m. and 20 units in the evening.   Please note that Glipizide and metformin have been discontinued and can be  restarted as an outpatient.   DISPOSITION:  Followup:  The patient to follow up in the Outpatient Clinic,  Dr. Allena Katz, on April 19, 2005 at 11 a.m.  He will get the following checks:  A CMET to follow up on the potassium as well as the elevated liver function  tests.  Please that the potassium on the day of discharge was 3.2 and  liver  function panel was as follows:  SGOT 67, SGP 63, total bilirubin 3.4, alk  phos 88.  He will also have CBG checked and his diabetic regimen will  adjusted accordingly.  Metformin and Glipizide were discontinued in this  patient during hospital stay.  Also, alcohol use, this patient did have an  alcoholic binge that led to his admission.   PROCEDURES:  1.  Abdominal x-ray was performed to rule out any perforation and did not      show any acute abdominal abnormalities and unremarkable bowel gas      pattern with no significant ileus.  2.  Chest x-ray was performed on April 12, 2005 as part of workup for      hypertension and to rule out pneumonia, and did not show any evidence of      acute disease.  3.  Ultrasound of the abdomen was performed on April 12, 2005 because of  elevated liver function tests and it showed a solitary gallstone without      evidence of cholecystitis or biliary dilatation and also showed stable      changes of hepatic cirrhosis and there were findings suggestive of      chronic pancreatitis as well.   HISTORY AND PHYSICAL:  Mr. Winsor is a 70 year old African-American male  with past medical history as indicated above who came to the emergency  department with a 2-day history of nausea and vomiting and inability to keep  anything down.  This happened after the patient went on a drinking binge and  he had one bottle of wine and three 40-ounce bottles of beer.  Since then,  he has been vomiting continuously and has not been able to keep anything  down which also includes water.  Thus, he decided to come to the emergency  room.  He does not give any history of abdominal pain, fever or chills, no  hematemesis or hematochezia.  Currently, the patient is having hiccups that  have been present since the day prior to admission.  He says he took all his  medications 2 days ago, but has not taken any since Tuesday.   ALLERGIES:  NO KNOWN DRUG ALLERGIES.    SOCIAL HISTORY:  He has never smoked.  Alcohol:  He is a heavy alcohol  drinker and drinks about a 1/5th of wine every day for the past 30 years.  No cocaine or IV drug use.  He is single.  He used to work in [employer]  before, but now is disabled and has Medicaid and lives with his mother who  has Alzheimer's disease.   FAMILY HISTORY:  Noncontributory.   PHYSICAL EXAMINATION:  VITAL SIGNS:  Pulse 112, blood pressure 126/82 that  dropped to 77/55 and then up to 118/76, temperature 97.2, respirations 18,  O2 saturations 98% on room air.  GENERAL:  The patient currently having  hiccups but otherwise in no acute distress.  HEENT:  Icterus is present.  Oropharynx is clear.  NECK:  No JVD, no bruit.  LUNGS:  Respirations clear  to auscultation bilaterally.  CARDIOVASCULAR:  Tachycardic, regular rate, no  murmurs, rubs or gallops.  ABDOMEN:  Soft, nontender, non-distended,  positive bowel sounds, liver edge palpable 5 cm.  EXTREMITIES:  No clubbing,  cyanosis or edema.  SKIN:  Dry.   ADMISSION LABORATORY DATA:  Sodium 124, potassium 3, chloride 85, bicarb  33.8, BUN 14.1, creatinine 2, glucose 429.  Anion gap of 6.  Total bilirubin  3.7 with indirect 2.6 and direct 1.1, alk phos 117, SGOT 95, SGP 89, protein  88, albumin 4.2, amylase 216, lipase 133.  Hemoglobin 15.2, white count 6.4,  platelets 109,000, MCV 97.1, ANC 4.3.  Blood acetone negative.   HOSPITAL COURSE:  Problem 1. Nausea and vomiting most likely secondary to  alcohol binge.  The patient was aggressively repleted with IV fluids, he was  also given Phenergan p.r.n. for his nausea and vomiting.  He was also put on  Protonix IV b.i.d. for any alcoholic gastritis.  His nausea and vomiting  resolved on the day after admission and clear liquids were started and his  diet was advanced as tolerated.  He consumed a regular diet the day prior to admission and his nausea and vomiting resolved.  He has been advised not to  consume any more  alcohol.   Problem 2. Hypotension most likely secondary to hypovolemia.  The patient's  blood pressure  in the emergency department at one point was 77/51, he was  given 2 liters of IV fluids bolus and then was aggressively treated with a  normal saline drip.  His blood pressure resolved.  Also, his blood pressure  medications were withheld.  On the day prior to admission his blood pressure  was stable without being on any IV medications.   Problem 3. Acute renal failure most likely secondary to hypovolemia.  The  patient's baseline creatinine was 1, however, creatinine on admission was 2  and BUN was 4, indicating a prerenal cause.  The patient was aggressively  hydrated and his BUN and creatinine returned to baseline.  Creatinine on day  of discharge was 1.1.  As noted above, his ACE inhibitor was withheld.   Problem 4. Hyperglycemia with no gap.  The patient's glucose on day of  admission was 429, his gap was 6, pH was 7.5 and his blood acetone was  negative.  Thus, it indicates that he was not in DKA.  He was continued on  NPH as well as sliding scale insulin.  Also, a hemoglobin A1c level was  obtained which was 8.8.  Blood sugars were between 150 and 250 during  hospital admission.  Also note that because of a history of chronic  pancreatitis and liver failure, he is at high risk for hypoglycemia since he  has low Glucagon production and does not have an effective mechanism for  gluconeogenesis, thus his Glipizide was stopped since tight glycemic control  is not a goal for this patient.  Also, his metformin was stopped during  hospital admission since he is at high risk for lactic acidosis.  These can  be restarted as an outpatient as needed.  Thus, on discharge his diabetic  medications only consisted of insulin 70/30, 30 units in the morning and 20  units in the evening.   Problem 5. Hypokalemia most likely secondary to vomiting.  The patient's  potassium on the day of admission  was 3, magnesium level was obtained which  was 2, his potassium was aggressively repleted and on the day of discharge  his potassium was 3.2.  He was slowly given potassium supplement and a  follow-up BMET will be obtained on day of discharge and he is being  discharged home on potassium supplement that he was taking originally.   Problem 6. Elevated liver function tests most likely secondary to alcoholic  hepatitis.  The patient had normal baseline liver function tests except for  a low albumin, however, his liver function tests were elevated on the day of  admission and this was thought to be secondary to alcoholic hepatitis.  His  liver function tests continued to trend down, however, they did not return  totally back to normal.  Follow-up CMET will be obtained on hospital  followup to ensure that his liver functions are down to normal.  Again, he has been strongly encouraged to quit drinking.  Also note that the patient  does have a history of hepatitis C.   Problem 7. Alcohol abuse.  The patient's alcohol level on the day of  admission was less than 5.  The patient was put on thiamine and folate and  he was also put on the Ativan protocol since has had a history of delirium  tremens in the past and his last alcohol drink was 2 days prior to  admission.  Again, he was strongly encouraged to quit drinking.  He says  that Alcoholics Anonymous  has not worked for him in the past.   DISCHARGE LABORATORY DATA:  Hemoglobin 12.7, platelets 73,000, and white  count 5.1.  Sodium 132, potassium 3.2, chloride 98, bicarb 28, BUN 20,  creatinine 1.1, glucose 154.  SGOT 63, SGPT 63, total bilirubin 3.4, alk  phos 88, magnesium 2, and CBG 154.   Discharge vital signs:  Temperature 98.4, pulse 74, respirations 20, blood  pressure 127/87, and saturation 95% on room air.      NWP/MEDQ  D:  04/14/2005  T:  04/14/2005  Job:  [id number]   cc:   Zetta Bills, MD  Internal Medicine Resident - [address]   [place]  [place] [address]  Fax: [phone]

## 2011-05-18 NOTE — H&P (Signed)
[facility]  Patient:    Eddie [name], Eddie [name]                   MRN: [id number] Proc. Date: 04/29/01 Adm. Date:  [id number] Attending:  Londell Moh CC:         [facility]   History and Physical  ADMITTING DIAGNOSIS:  Urinary retention.  SECONDARY DIAGNOSES: 1. Insulin-dependent diabetes mellitus. 2. Hypertension. 3. History of seizure secondary to previous alcohol abuse. 4. History of hepatitis B and C.  HISTORY:  This 70 year old male has had intermittent problems with urinary retention.  The patient has used alpha blockers with only modest improvement. The patient was scheduled for a TURP several weeks ago but changed his mind. He subsequently went into complete retention and had to have a catheter placed.  He has now agreed to undergo a TURP and will be admitted following that procedure.  PAST MEDICAL HISTORY: 1. Hypertension. 2. 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 3. Diabetes (insulin-dependent). 4. Hepatitis B and C.  CURRENT MEDICATIONS: 1. Lotensin 10 mg daily. 2. Glucotrol XL 5 mg daily. 3. Prevacid 30 mg daily. 4. Folate 1 mg daily. 5. Insulin NPH 10 units in the morning.  PAST SURGICAL HISTORY:  Inguinal hernia repair in 2001.  SOCIAL HISTORY:  He drinks one pint of wine per day.  He no longer is a smoker.  He denies the use of any other substances.  He has a very supportive family including a mother and a sister in [place] who is involved in his care.  REVIEW OF SYSTEMS:  Otherwise noncontributory.  We do note that he does wear glasses.  Denies any other chronic medical conditions.  PHYSICAL EXAMINATION:  GENERAL:  Well-developed, well-nourished male in no acute distress.  VITAL SIGNS:  Temperature 99.4, pulse 112, respirations 16, blood pressure 110/70, weight 156.  HEENT:  Normocephalic, atraumatic.  Cranial nerves 2-12 grossly intact.  NECK:  Supple with no adenopathy or  thyromegaly.  LUNGS:  Clear.  HEART:  Regular rate and rhythm.  No murmur, thrill, gallop, rub, heave.  ABDOMEN:  Soft, nontender.  No palpable masses, rebound, or guarding.  GENITOURINARY:  Normal testicles bilaterally.  The penis is free of any lesions.  There is no hydroceles, spermatocele, varicosity, hernia, or adenopathy.  Penile meatus is normal.  EXTREMITIES:  No clubbing, cyanosis, or edema.  NEUROLOGIC:  Appears grossly intact.  RECTAL:  3+ benign-feeling prostate.  A Foley catheter is in place.  IMPRESSION:  Benign prostatic hypertrophy with bladder outlet obstruction. The secondary diagnoses are diabetes and hypertension.  PLAN:  Admit following transurethral resection of the prostate. DD:  04/29/01 TD:  04/29/01 Job: [id number]

## 2011-05-18 NOTE — Discharge Summary (Signed)
Eddie [name], Eddie [name]            ACCOUNT NO.:  [id number]   MEDICAL RECORD NO.:  [id number]          PATIENT TYPE:  INP   LOCATION:  3034                         FACILITY:  [facility]   PHYSICIAN:  Artist Beach, MD        DATE OF BIRTH:  Dec 28, 1941   DATE OF ADMISSION:  10/29/2004  DATE OF DISCHARGE:                                 DISCHARGE SUMMARY   DISCHARGE MEDICATIONS:  1.  Lactulose 30 mL twice daily.  2.  Folate 1 mg daily.  3.  Thiamine once daily.  4.  Protonix 40 b.i.d.  5.  Neurontin 300 mg three times a day.  6.  Lotensin 10 mg daily.  7.  Metformin 500 mg b.i.d.  8.  KCl 20 mEq daily.   PROBLEM LIST:  1.  Nausea, vomiting.  2.  History of hematemesis.  3.  History of insulin-dependent diabetes mellitus.  4.  History of diabetic ketoacidosis.  5.  Hypertension.  6.  History of tuberculosis, status post treatment for six months.  7.  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  8.  History of alcohol abuse and withdrawal seizures.  9.  Hepatitis C/alcohol cirrhosis.  10. Left inguinal hernia repair in 2000.  11. History of benign prostatic hypertrophy.  12. History of chronic pancreatitis diagnosed in 2003.  13. Cholelithiasis in 2005.  14. Occult-positive stools.  15. History of orthostatic hypertension.  16. Status post esophagogastroduodenoscopy in March 2005 showing      esophagitis, antral erosion, distal esophageal stricture, hiatal hernia.  17. History of elevated AFB 13.8 as of October 30, 2004.   HISTORY OF PRESENT ILLNESS:  Mr. Sproule is a 70 year old African American  male patient with history of diabetes, hypertension, alcohol abuse,  alcoholic cirrhosis, presented with two-day history of nausea and vomiting,  hematemesis, and sore throat.  The patient also complained of dizziness,  weight loss of about 15 pounds in two months, melena, anorexia.  Denies  headache, chest pain, hematochezia, abdominal pain, diarrhea, dysuria,  hematuria, rash.   The patient did admit to use of alcohol and noncompliance  with medicine.   ALLERGIES:  No known allergies.   OPERATION/PROCEDURE:  1.  The patient got an MRI of the liver showing cirrhotic changes in the      liver with irregular pattern, but no focal lesions.  Also thickened      gallbladder wall and some free fluid secondary to cirrhosis and ascites.  2.  Ultrasound of the abdomen showed heterogenous liver, cholelithiasis,      thickened gallbladder wall.  Recommended further work-up to diagnose      cholecystitis.  In absence of symptoms, this was not worked up.   PHYSICAL EXAMINATION:  VITAL SIGNS:  On examination on admission,  temperature 98, pulse 136, respiratory rate 24, blood pressure 99/68.  The  patient saturating 96% on room air.  GENERAL:  Thin, alert, in no acute distress.  HEENT:  Arcus senilis present.  Pupils are reacting to light.  Extraocular  muscles were intact.  Icteric, muddy sclerae.  ENT:  The patient has upper  and lower  dentures.  No congestion, no thrush.  NECK:  Supple.  No cervical lymphadenopathy.  No bruit.  No JVD.  RESPIRATORY:  Air entry good.  No rales or rhonchi.  CARDIOVASCULAR:  S1 and S2 normal.  No murmur, gallop or rub appreciated.  ABDOMEN:  Soft, nontender.  Liver edge is palpable.  Bowel sounds increased.  RECTAL:  Dark brown, heme-positive stool on rectal examination.  Normal  rectal tone.  EXTREMITIES:  No edema.  +2 pulses.  CNS: Grossly nonfocal.  PSYCHIATRIC:  The patient is oriented x3.   LABORATORY DATA ON ADMISSION:  Sodium 139, potassium 2.9, chloride 88,  bicarb 33, BUN 26, creatinine 2, glucose 271.  Hemoglobin 14.7, WBC 7.8, PT  14.1, INR 1.1, PTT 22.  The patient had an anion gap of 15.  Bilirubin 2.8,  alk phos 125, SGOT 60, SGPT 57, protein 9, albumin 4.4, calcium 8.8.  Alcohol level less than 5, lipase 50.   HOSPITAL COURSE:  1.  Nausea and vomiting, hematemesis.  The patient did not have any episode      of  hematemesis overnight.  Has not noticed any blood in the stool.      Feels much better with IV fluids.  The patient was treated with      Phenergan 25 mg three to four hours p.r.n. and then Reglan to control      hiccups.  The patient did get a GI consult but initially felt the need      of repeat EGD.  The patient had the latest EGD in March 2005 that showed      findings as mentioned in the problem list.  But with the hematemesis      resolved, EGD was cancelled.  The patient's hemoglobin did fall from      14.4 to 12.3 and then 11 during his stay in the hospital.  This was      mostly secondary to IV fluids and blood draws.  Will monitor that.   1.  Diabetes mellitus.  The patient was put on sliding scale.  On discharge,      the patient was switched to metformin 500 mg b.i.d.  His diabetic      regimen was changed since the patient was not aware of blood draws.  He      was taking also insulin.  The patient thought he was just supposed to      take one, so change his regimen and will monitor on hospital followup.      Will put him back on insulin if need be.   1.  Hepatitis B/alcoholic cirrhosis.  The patient did get a US of the      abdomen which showed heterogenous liver which was followed up on MRI.      It showed irregular bladder and cirrhotic changes but no focal lesions      was found.  The elevated AFB in March was followed up which was up to      13.8 from 11.8 previously.  Will continue to monitor this.  The patient      continues to abuse alcohol in spite of counseling, the patient's LFTs      did improve during his stay here.  His bilirubin went down to 1.3, alk      phos went down to 70, then went up to 101.  Transaminases resolved.      Total protein went down to 5.9 and albumin 2.7 which was  assumed to be      secondary to dilution.  Will continue to monitor calcium 8.2.  Also his     platelets went down to 57 from 102 on admission.  Will need to monitor      this, too.   While some liver functions are resolving, some are still on      the high side and this could be secondary to his cirrhosis with ongoing      alcohol abuse.   1.  Decreased blood pressure on admission which resolved with IV fluids.      His Lotensin was held due to low blood pressure which on discharge the      patient was put back on it.   1.  Hypokalemia.  The patient was repleted with potassium on admission and      was also discharged ____________.  Still the patient follows up in the      clinic.  His magnesium level was checked which was 1.8.  On the day      after discharge, the patient received 2 g of magnesium sulfate IV over      two hours.   1.  Acute renal failure secondary to dehydration resolved with IV fluids.      Will check his BMET before the patient is discharged.  Will make a note      of it on followup.   FOLLOW UP:  The patient is to follow up in clinic on November 9, at 3 p.m.  Will get a stat CMET on him.       SP/MEDQ  D:  11/01/2004  T:  11/01/2004  Job:  [id number]

## 2011-05-18 NOTE — Discharge Summary (Signed)
[facility]  Patient:    Eddie [name]                    MRN: [id number] Adm. Date:  [id number] Disc. Date: [id number] Attending:  Madaline Guthrie Dictator:   Karlene Einstein CC:         Nancee Liter, M.D.             Dr. Mila Palmer                           Discharge Summary  DISCHARGE DIAGNOSES:  1. Vomiting and dehydration resolved.  2. Hyperkalemia secondary to vomiting.  3. Hyponatremia.  4. Insulin-dependent diabetes mellitus  5. Hypertension  6. Pancytopenia with macrocytosis secondary to alcohol abuse.  7. Alcohol abuse.  8. History of TD.  9. History of hepatitis B & C. 10. Left inguinal herniorrhaphy.  PROCEDURES:  1. Chest x-ray - no active disease.  2. Abdominal ultrasound - fatty liver otherwise negative.  CONSULTANTS:  None.  DISCHARGE MEDICATIONS:  1. Protonix 40 mg p.o. q.d.  2. Thiamine 100 mg p.o. q.d.  3. Folic acid 1 mg p.o. q.d.  4. Altace 1.25 mg p.o. b.i.d.  FOLLOWUP:  Dr. Gwendolyn Fill at outpatient clinic on December 19, 1999.  HISTORY OF PRESENT ILLNESS:  This is a 71 year old African American male with a  history of alcohol abuse, insulin-dependent diabetes mellitus presented to the emergency department with complaints of nausea, vomiting, hiccups and unable to eat or drink for 4 days.  REVIEW OF SYSTEMS:  Positive coffee ground emesis and inability to sleep. Denies abdominal pain, melena, hematochezia, and dysuria, fever, chills, night sweats,  weight loss and lower extremity edema. Admits to positive intermittent cough with white sputum.  No shortness of breath or chest pain.  PHYSICAL EXAMINATION:  VITAL SIGNS:  Temperature 102.0.  Blood pressure 129/82, pulse 129, respiratory  rate 20.  Pulse oximetry 100% on room air.  No orthostatic changes.  GENERAL:  No acute distress.  HEENT:  Normocephalic, atraumatic.  Pupils equal, round and reactive to light and accommodation.  Extraocular muscles  intact.  No icterus or pallor.  Oropharynx moist.  No discharge.  NECK:  Supple.  No lymphadenopathy.  LUNGS:  Clear to auscultation bilaterally.  HEART:  Regular rate and rhythm.  Normal S1, S2.  No murmurs or gallops.  ABDOMEN:  Soft, nontender.  Nondistended.  Positive bowel sounds.  No hepatosplenomegaly.   Gastroccult positive vomitus.  RECTAL:  Per medical history in good tone.  Guaiac negative.  No masses.  EXTREMITIES:  Plus 2 pulses.  No edema.  Normal extremities.  NEUROLOGIC:  Alert and oriented x 3.  No focal deficits.  LABORATORY DATA:  White count 9.2, hemoglobin 14.5, platelets 87.  MCV 110, sodium 131, potassium 3.1, chloride 87, bicarbonate 32, BUN 14, creatinine 1.2, glucose 123, calcium 8.8, total protein 8.5, albumin 4, SGOT 130, SGPT 88,  alkaline phosphatase 105, total bilirubin 3.8.  Chest x-ray and abdominal ultrasound noted above.  EKG: Normal sinus rhythm,  No ischemic changes.  HOSPITAL COURSE: #1 - DEHYDRATION:  This is secondary to vomiting and no p.o. intake.  Patient had      orthostatic changes therefore started on normal saline hydration and clear  liquid diet. #2 - NAUSEA AND VOMITING:  The vomitus was gastroccult positive.  Differential      diagnosis considered esophagitis and some peptic ulcer gastritis. Hemoglobin  was followed closely.  IV Pepcid, ______, ______, and chlorpromazine      started for nausea, vomiting and hiccups.  An EGD was considered if the      bleeding persisted but the nausea and vomiting resolved.  His diet was      advanced and tolerated well.  He stated that he had a normal colonoscopy 2      months ago by Dr. Mila Palmer since the bleeding resolved and EGD was not pursued.      The patient was discharged in stable condition. #3 - HYPOKALEMIA:  This is secondary to vomiting, potassium was replaced. #4 - HYPONATREMIA:  Sodium was only mildly decreased and he was started on normal      saline.  Sodium improved  to 134. #5 - DIABETES MELLITUS:  Insulin and Glucotrol was held. CBGs were followed every 6      hours.  He was covered with regular insulin sliding scale. #6 - HYPERTENSION:  Lotensin was held initially since he was hypovolemic and blood      pressure closely followed.  Later he was started on Altace 1.25 mg p.o. b.i.d. #7 - ALCOHOL ABUSE:  Thiamine and folate given.  Alcohol level was less than 10.      PT 18.3. #8 - ANEMIA:  Vitamin B12 level 411.  MCV 108.5 secondary to history of alcohol      abuse.  CONDITION ON DISCHARGE:  Stable. DD:  02/07/00 TD:  02/07/00 Job: [id number]

## 2011-05-18 NOTE — H&P (Signed)
[facility]  Patient:    Eddie [name], Eddie [name] Visit Number: [id number] MRN: [id number]          Service Type: EMS Location: ED Attending Physician:  Shelba Flake Dictated by:   Stacie Glaze, M.D. [facility] Admit Date:  06/05/2002   CC:         Marene Lenz, M.D.  Alvester Morin, M.D.   History and Physical  DATE OF BIRTH:  11-19-1941  ADMITTING DIAGNOSES: 1. Abdominal pain with nausea and vomiting. 2. Chronic pancreatitis. 3. Hyperkalemia. 4. Hyponatremia.  CHIEF COMPLAINT:  Abdominal pain, nausea, and vomiting.  HISTORY OF PRESENT ILLNESS:  The patient is a 70 year old black male who has frequently been admitted by the teaching service and is followed in the internal medicine clinic for alcohol abuse, chronic hepatitis B and C, chronic pancreatitis with pseudocyst formation, and has had multiple ER admissions for nausea, vomiting, and dehydration.  Tonight, he presents in the [facility] Emergency Room, after having presented 24 hours earlier with similar complaints of nausea, vomiting, and inability to keep down food.  He was found to be markedly orthostatic, hyponatremic, and hyperkalemic with initial sodium of 122, and potassium of 8.2.  A repeat sodium of 126 and potassium of 5.8 was noted.  The patient was recently admitted to the teaching service from May 24, 2002 to May 29, 2002, and evaluated for pancreatitis.  Was evaluated in the emergency room on June 04, 2002, and he states that he has been taking his medications, but cannot confirm the medications by name.  He is sedated from Thorazine which was given to him for chronic hiccups.  He is a poor historian and cannot give an accurate history as to his medical problems.  His history is obtained from prior charts and dictations.  PAST MEDICAL HISTORY:  Significant for chronic pancreatitis with pseudocyst formation.  The last CT noted was in December of 2002.  He has  chronic cholelithiasis without cholecystitis.  He has insulin-requiring diabetes secondary to his chronic pancreatitis.  He is known to have chronic hepatitis B and C.  He has benign prostatic hypertrophy.  He has hypertension, chronic alcohol abuse, and a history of a hyponatremia.  MEDICATIONS:  Per prior discharge summaries are Lotensin 10 mg p.o. q.d., Glucotrol XL 5 mg p.o. q.d., Pepcid 20 mg p.o. q.d., folate 1 mg p.o. q.d., and NPH insulin 10 units subcu q.a.m.  He also has received prescriptions for Phenergan 25-50 mg p.o. p.r.n.  ALLERGIES:  No known drug allergies.  FAMILY HISTORY:  Noncontributory to present admission.  SOCIAL HISTORY:  He is a relapsing alcoholic.  He is a homosexual male.  His last HIV was negative in 2000 per review of chart.  REVIEW OF SYSTEMS:  He has generalized weakness without any focal weakness. He moves all extremities without difficulty.  He has no complaints of melena or hematochezia.  He denies hematemesis.  He complains of urinary hesitancy without dysuria.  He states that he has been taking his medications and denies any alcohol within the last 48 hours.  PHYSICAL EXAMINATION:  GENERAL:  He is a chronically ill black male who is markedly orthostatic.  VITAL SIGNS:  His pulse goes from 90 to 123 when moving from lying to sitting.  HEENT:  Pupils are equal, round and reactive to light and accommodation.  His extraocular movement is intact.  NECK:  Supple with full range of motion.  LUNGS:  Fields are clear to  auscultation and percussion.  HEART:  Examination revealed tachycardia without rub.  He has a 1/6 systolic murmur.  ABDOMEN:  Diffusely tender.  Bowel sounds are present throughout.  It is moderately distended without any focal masses.  EXTREMITIES:  Examination reveals no cyanosis or clubbing.  NEUROLOGIC:  Examination reveals him to be alert and oriented x2.  He appears confused and somewhat somnolent secondary to  medication.  He moves all extremities and has no apparent focal deficits.  LABORATORY DATA:  Initial laboratory data revealed a sodium of 122, potassium 8.2 and on repeat potassium 5.8, chloride 95, BUN 39, glucose 440.  PH obtained revealed a pH of 7.47, pCO2 of 32, bicarbonate of 24.  White cell count was 11.2, hemoglobin was 16.0, and hematocrit 45.9.  A lipase was 535. Alcohol level was less than 5.  Urine drug screen was positive for opiates, negative for cocaine, benzodiazepine, barbiturates, amphetamines, or tetrahydrocannabinol.  Urine examination was negative.  Acute abdomen series revealed no signs of obstruction.  IMPRESSION: 1. Hyponatremia.  The patient is markedly dehydrated and volume should be    replaced with normal saline. 2. Hyperkalemia.  The patient is on an ACE inhibitor.  The ACE inhibitor    should be held and consideration for replacement with other medications,    for which should be replaced with normal saline and potassium should be    monitored. If potassium remains elevated, consider fluid boluses and IV    Lasix versus Kayexalate. 3. Electrolyte imbalance.  Consider induced Cushing disease versus effects of    chronic alcoholism and fasting Cortisol level should be obtained in the    morning and a random Cortisol tonight.  TSH will be obtained in    consideration for ACTH for further workup, outpatient. 4. Chronic pancreatitis.  The patient is kept n.p.o.  Recommend that Pancrease    be initiated with refeeding and serial lipases be drawn. 5. Orthostatic hypotension secondary to hypovolemia.  Recommend holding ACE.    Consider changing blood pressure medicine to labetalol.  Despite his    diabetes, it would be better in controlling his blood pressure given his    frequent admissions and volume status. 6. Diabetes.  CBGs will be obtained q.6h. while n.p.o. with sliding scale    insulin.  Once he begins to take orally, he will be placed on an 1800 ADA      diet.  DISPOSITION:  The teaching service has agreed to take this patient in transfer in the morning for better consistency of his medical care since all of his prior admissions have been at [facility], and since his clinic follow up and his physician are members of the internal medicine teaching service.  This case was discussed with the emergency room physician and with the resident on call who agrees to accept the patient in transfer in the morning. The patient was informed and is in agreement to be transferred. Dictated by:   Stacie Glaze, M.D. [facility] Attending Physician:  Shelba Flake DD:  06/06/02 TD:  06/06/02 Job: [id number]

## 2011-05-18 NOTE — Discharge Summary (Signed)
[place]. [facility]  Patient:    Eddie [name], Eddie [name] Visit Number: [id number] MRN: [id number]          Service Type: MED Location: [phone] Attending Physician:  Eddie [name] Dictated by:   Eddie [name], M.D. Admit Date:  12/23/2001 Discharge Date: 12/24/2001   CC:         Eddie [name], M.D.                           Discharge Summary  DISCHARGE DIAGNOSES:  1. 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  2. Two pseudocysts noted in the lesser sac on abdominal CT,     December 22, 2001.  3. Cholelithiasis without CT evidence of acute cholecystitis (current     hospitalization).  4. Insulin-dependent diabetes mellitus.  5. Hepatitis B and hepatitis C.  6. Benign prostatic hypertrophy.  7. History of hypertension.  8. Alcohol abuse.  9. Status [name] transurethral resection of prostate. 10. Status [name] hernia repair.  DISCHARGE MEDICATIONS:  1. Darvocet-N 100 one tablet q.4-6h. p.r.n. pain (the patient previously     given this prescription in the emergency room on December 21, 2001 and     filled).  2. Lotensin 10 mg p.o. q.d.  3. Glucotrol XL 5 mg p.o. q.d.  4. Pepcid 20 mg p.o. b.i.d.  5. Folate 1 mg p.o. q.d.  6. Insulin NPH 10 units subcu q.a.m.  7. Phenergan 25 mg 1 tablet q.6h. p.r.n. nausea (medication given at current     hospitalization).  FOLLOWUP:  The patient is to follow up in the internal medicine outpatient clinic, telephone number [phone], with his primary care doctor, Dr. Marene [name], as previously scheduled.  The patient is to call the clinic on Thursday, December 26, for an appointment or to verify when his next appointment is, as the patient currently does not know.  At his followup with Dr. Jenne [name], further followup with a repeat CT scan for his pseudocyst will need to be made in the future.  SPECIAL INSTRUCTIONS:  The patient has been advised on several occasions to avoid alcohol  use.  PROCEDURES AND DIAGNOSTIC STUDIES:  The patient had a CT of the abdomen and pelvis performed at [facility] on December 22, 2001 with the findings being: 1. Findings consistent with acute pancreatitis, particularly involving    the head of the pancreas, which is increased in size relative to a previous    examination in March 2002. 2. Cholelithiasis without CT evidence for acute cholecystitis and no    biliary ductal dilatation. 3. Development of at least two adjacent pseudocysts in the lesser sac which    are new since March 2002. 4. There was thickening of the wall of the gastric antrum and pylorus,    which may be secondary to the pancreatitis, though peptic disease may have    a similar appearance. 5. Sigmoid diverticulosis without diverticulitis.  ADMISSION HISTORY AND PHYSICAL:  Briefly, [name] was admitted at [facility] on December 22, 2001 and later transferred to [facility] on December 23, 2001.  The patient was initially admitted at [facility] for recurrent pancreatitis secondary to alcohol abuse.  The patient also has a history of hypertension and diabetes.  The patient presented to [facility] with a one-week history of nausea and vomiting which was on and off.  The patient was previously seen in the [facility] twice during that same week and was sent home with pain medications and antiemetics.  The patient was previously discharged on December 16, 2001 from the medical teaching service [facility] with the recurrent pancreatitis, and since that time the patient admits to drinking a "small amount of alcohol."  ADMISSION LABORATORY DATA:  On December 22, 2001, the patient had an amylase level of 368, a lipase level of 136.  A CBC revealed a white count of 12.0, a hemoglobin of 13.4, platelets 161 with a left shift with absolute neutrophil count of 9.2.  The patients electrolytes were sodium 127, potassium  4.7, chloride 86, bicarb 33, glucose 229, BUN 11, creatinine 1.0 with LFTs revealing bilirubin 1.2.  Alkaline phosphatase was normal at 79.  AST 44, ALT 35, protein 7.4, albumin 2.8, and calcium of 9.1.  The patient initially had cardiac enzymes revealing a CK of 214, CK-MB of 1.9, with a relative index of 0.9 and a troponin of 0.01.  Of note, the patient did have a previous lipase level drawn in the emergency room on December 20, 2001 which was elevated at 330.  HOSPITAL COURSE: #1 - ACUTE PANCREATITIS ON TOP OF CHRONIC PANCREATITIS SECONDARY TO ALCOHOL ABUSE:  Initially, the patient was admitted by Dr. Arlyce Dice with gastroenterology at [facility].  The patient was made n.p.o. and was IV hydrated.  The patients nausea, vomiting, and abdominal pain subsided significantly.  The patient did have a CT scan with impressions noted above. There was question to whether there was a common bile duct stone, for which Dr. Arlyce Dice did not feel was the current etiology for the patients acute pancreatitis.  There was no elevation of the patients alkaline phosphatase. The patient did have noted two pseudocysts in the lesser sac which were new compared to a previous CT done in March 2002.  The patient was transferred to [facility] to the medical teaching service B on December 23, 2001. At that time, he had no abdominal pain or nausea or vomiting.  The patient was tolerating p.o. on December 24, 2001.  The patients electrolytes were replaced.  The patient was not complaining of any pain and was stable for discharge home on his pain medication, which included Darvocet, which he obtained in the emergency room earlier last week.  The patient was also instructed to abstain from alcohol use completely.  The patient was also given a prescription for Phenergan if the nausea returned.  The patient is to follow up with Dr. Marene [name] as previously scheduled.  The patient was given the telephone  number and was instructed to call the clinic on December 26 when it  reopens to find out when his next appointment is and, if necessary, to make an appointment with Dr. Jenne [name].  Further followup in regards to the patients pseudocysts can be made on an outpatient basis.  At the day of discharge, the patients lipase level was rechecked and was noted to be 53, which is significantly decreased from a level of 330 on December 20, 2001, and then 136 on December 22, 2001 on admission.  The patient also had a decreasing white blood cell trend from 12.0 on admission down to 5.3 on the day of discharge with an absolute neutrophil count within normal limits at 3.2.  #2 - ALCOHOL ABUSE:  The patient has a history of alcohol abuse, which is likely the etiology for his recurrent pancreatitis.  The patient has been instructed to avoid alcohol abuse  and has been given the number for alcohol and drug services.  Further followup of this needs to be made on an outpatient basis.  #3 - HYPOKALEMIA:  The patient did have a slightly low potassium level.  On the day of discharge, he was noted to have a potassium of 3.0, which was replaced x2 orally prior to discharge.  Again, the patient was not having any active GI loss with diarrhea or nausea or vomiting at the time of discharge and was tolerating p.o. Dictated by:   Eddie [name], M.D. Attending Physician:  Eddie [name] DD:  12/24/01 TD:  12/26/01 Job: [id number]

## 2011-05-18 NOTE — Discharge Summary (Signed)
Eddie [name], Eddie [name]            ACCOUNT NO.:  [id number]   MEDICAL RECORD NO.:  [id number]          PATIENT TYPE:  INP   LOCATION:  5710                         FACILITY:  [facility]   PHYSICIAN:  Peggye Pitt, M.D. DATE OF BIRTH:  [birth year]   DATE OF ADMISSION:  12/02/2006  DATE OF DISCHARGE:  12/04/2006                               DISCHARGE SUMMARY   DISCHARGE DIAGNOSES:  1. Acute pancreatitis.  2. 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  3. Alcohol abuse.  4. Hypertension.  5. Type 2 diabetes.  6. Pancytopenia secondary to bone marrow suppression from chronic      alcohol use and hypokalemia.   DISCHARGE MEDICATIONS:  1. Benazepril 10 mg p.o. q. daily.  2. Folic acid 1 mg p.o. q. daily.  3. Thiamine 100 mg p.o. q. daily.  4. Lantus insulin 30 units subcu q.h.s.  5. Metformin 500 mg p.o. b.i.d.   DISPOSITION AND FOLLOWUP:  1. Patient will be discharged to [facility] for inpatient      alcohol detoxification.  He has been scheduled to hospital followup      in the outpatient clinic with Dr. Ardyth Harps on December 20th at      2:30 p.m.  At that time, a stat CBC and BMET should be drawn to      follow his white count and his hypokalemia.  2. Imaging studies.  Patient had a plain film of his abdomen that      showed a benign-appearing abdomen with no evidence of SBO rule out      ileus, and there was a mottled appearance of the mediastinum that      could represent a large hiatal hernia, but could also be air in the      mediastinum for which a PA and lateral chest x-ray was recommended.      That PA and lateral chest x-ray was also done on December 02, 2006      that showed bibasilar atelectasis but no evidence of pneumo-      mediastinum.   HISTORY AND PHYSICAL EXAMINATION:  For full details, please refer to the  chart, but in brief, Eddie [name] is a 70 year old African-American man  who presented to the emergency department following a 3-day  history of  intractable nausea and vomiting, about 10-12 episodes a day.  No blood  and some suprapubic abdominal pain.  VITAL SIGNS:  Upon admission, his temperature was 98.6, heart rate 112,  respirations 20.  His blood pressure was 133/79 with sats of 97% on room  air.   LABORATORY:  Upon admission, WBC is 9.8, hemoglobin 15.5 with a  hematocrit of 45.9.  His MCV was 102.  His platelets are 112,000.  Hepatic function panel showed a total bilirubin of 3.6, fractionated to  direct 1.3 and indirect 2.3.  Alkaline phosphatase is 138, AST 117, ALT  7.8 and then albumin of 3.7.  Patient's lipase was elevated at 184.  A  BMET showed a sodium of 135, potassium 3.2, chloride 97, CO2 28, BUN 19,  creatinine 1.0 and a glucose of 118.  HOSPITAL COURSE PER ACTIVE PROBLEM:  Problem:  1. Pancreatitis.  Patient was treated supportively, was placed NPO,      given IV fluids with D5, given his NPO status, was given some pain      medicines for which he never asked for.  On second day of      hospitalization, his diet was advanced, which he tolerated without      any problems.  He never presented a fever.  He did not have an      elevated white blood cell count.  He [facility] detox, in stable condition.  2. All other chronic medical issues were stable throughout      hospitalization.  3. Vital signs on day of discharge:  Temperature 98.8, heart rate 85,      respirations 20, blood pressure 138/84 with O2 saturations of 99%      on room air.   LABS UPON DAY OF DISCHARGE:  Sodium 132, potassium 3.2 which has been  repleted, chloride 100, bicarb 26, BUN 7, creatinine 0.9, glucose of  185.  His total bili is decreased to 3.3.  His alkaline phosphatase is  78, AST 71, ALT 45, with an albumin of 2.3.  On his CBC, he has a WBC  count of 6.4, hemoglobin 11.5, hematocrit 33.1 and platelets of 70,000.  Of note during his last hospitalization, he was  discharged with a  platelet count of 54,000.   Patient is to be discharged to the [facility],  and he will be admitted for inpatient alcohol detoxification.      Peggye Pitt, M.D.  Electronically Signed     EH/MEDQ  D:  12/04/2006  T:  12/04/2006  Job:  [id number]

## 2011-05-18 NOTE — Consult Note (Signed)
NAME:  Eddie [name], Eddie [name]                      ACCOUNT NO.:  [id number]   MEDICAL RECORD NO.:  [id number]                   PATIENT TYPE:  INP   LOCATION:  4729                                 FACILITY:  [facility]   PHYSICIAN:  Barbette Hair. Arlyce Dice, M.D. [facility]          DATE OF BIRTH:  10/14/1941   DATE OF CONSULTATION:  DATE OF DISCHARGE:                                   CONSULTATION   REASON FOR CONSULTATION:  Heme positive stool.   HISTORY OF PRESENT ILLNESS:  Eddie [name] is a 70 year old African American  male admitted with increased syncope.  He has a history of diabetes.  He had  near syncope today prompting hospital admission.  He has a history of  diabetes.  Hemoccult-positive stool was noted.  Eddie [name] has a history of  alcoholism, hepatitis B and hepatitis C.  He continues to drink.  Abnormal  liver testing had been noted in the past.  He currently underwent  sigmoidoscopy in 2000.  He claims to have seen black stools in the past.  More recently, he has seen small amounts of blood mixed with stools.  There  is no history of ulcer disease.   PHYSICAL EXAMINATION:  GENERAL:  He is a healthy-appearing male.  He is  anicteric.  HEENT:  Exam is within normal limits.  CHEST:  Clear without cardiac murmurs, gallops or rubs.  ABDOMEN:  Without mass, tenderness or organomegaly.  RECTAL:  Deferred.   LABORATORY DATA:  Hemoglobin 12.8, hematocrit 37, MCV 98, BUN 8, creatinine  1.1.  Alkaline phosphatase 181, AST 146, ALT 143.  Albumin 2.8.   IMPRESSION:  1. Heme positive stool, could be due to chronic GI bleeding source,     including ____ peptic disease, arteriovenous malformations, gastric or     colonic neoplasms, polyps or hemorrhoids.  2. Chronic liver disease.  This is probably a combination of chronic viral     hepatitis and his alcoholism.   RECOMMENDATIONS:  1. Protonix 40 mg daily.  2. Upper endoscopy.  3. Colonoscopy.                                               Barbette Hair. Arlyce Dice, M.D. [facility]    RDK/MEDQ  D:  03/12/2004  T:  03/12/2004  Job:  [id number]   cc:   Fransisco Hertz, M.D.  [address]  [place] [address]  Fax: [phone]

## 2011-05-18 NOTE — Discharge Summary (Signed)
[place]. [facility]  Patient:    Eddie [name], Eddie [name] Visit Number: [id number] MRN: [id number]          Service Type: MED Location: [phone] Attending Physician:  Madaline Guthrie Dictated by:   Bonnell Public, M.D. Admit Date:  12/14/2001 Discharge Date: 12/16/2001   CC:         Marene Lenz, M.D.   Discharge Summary  PRIMARY CARE PHYSICIAN:  Marene Lenz, M.D.  DISCHARGE DIAGNOSES: 1. Acute pancreatitis secondary to alcohol abuse. 2. Insulin-dependent diabetes mellitus. 3. Hepatitis B and hepatitis C. 4. Benign prostatic hypertrophy. 5. Hypertension. 6. Alcohol abuse.  DISCHARGE MEDICATIONS: 1. Lotensin 10 mg p.o. q.d. 2. Glucotrol XL 5 mg p.o. q.d. 3. Pepcid 20 mg p.o. b.i.d. 4. Folate 1 mg p.o. q.d. 5. Insulin NPH 10 units subcu q.a.m.  FOLLOWUP:  The patient is to follow up with Dr. Jenne Pane at the next scheduled visit.  PROCEDURES AND DIAGNOSTIC STUDIES:  None.  CONSULTANTS:  None.  ADMISSION HISTORY AND PHYSICAL:  The patient is a 70 year old African American male with history of alcohol abuse, hepatitis B and hepatitis C, who presented with a two-day history of hiccups, abdominal pain, and vomiting.  He had vomited a moderate amount of nonbloody, nonbilious material 8-10 times in the past two days before admission.  His abdominal pain was a very mild, dull, nonradiating pain in his left lower quadrant that had pretty much resolved at the time of admission.  He had previously been feeling fine without any fevers, chills, diarrhea, constipation, hematemesis, melena, chest pain, or shortness of breath.  ADMISSION LABORATORY DATA:  Sodium 131, potassium 3.6, chloride 93, bicarbonate 28, BUN 6, creatinine 1.0, glucose 163.  White blood cell count 10.5, hemoglobin 14.0, hematocrit 41.3, platelets 182, total protein 9.2, albumin 3.3, AST 57, ALT 47, alkaline phosphatase 107, total bilirubin 1.6, lipase 80.  EKG showed sinus  tachycardia without any ST changes.  HOSPITAL COURSE: #1 - ABDOMINAL PAIN:  His clinical picture suggested pancreatitis, especially with his history of significant alcohol abuse and elevated lipase.  He had no other risk factors for pancreatitis including surgery, trauma, gallstones, diuretic use, hypertriglyceridemia, or hypercalcemia.  He was treated supportively with hydration with normal saline at 200 cc per hour for eight hours followed by one-half normal saline at 150 cc per hour.  He was kept n.p.o. except for medications and was given baclofen for hiccups, Phenergan for nausea.  He did not require any morphine for pain.  His symptoms markedly improved the next morning and he was placed on a clear liquid diet, which he tolerated well without nausea or vomiting.  He was advanced to a full-liquid diet for the following meal, which he also tolerated well without nausea or vomiting.  He was kept overnight without any problems and had a full breakfast without any problems.  He had good bowel sounds throughout his course and had two bowel movements.  He was discharged in stable condition and was advised to eat as tolerated.  #2 - DIABETES MELLITUS:  Since he was placed n.p.o., his Glucotrol and insulin were held and he was placed on a sliding scale insulin, until he resumed eating the following morning.  His CBGs were monitored throughout and his blood sugars ranged from 100-174 throughout his stay.  His home medications of Glucotrol and insulin were resumed once he began taking p.o.  #3 - ALCOHOL ABUSE:  He was given thiamine and continued on his home regimen of folate  on admission.  Given his history of having DTs, he was monitored carefully for signs of withdrawal and he did not have any during his stay.  DISCHARGE LABORATORY DATA:  Sodium 133, potassium 4.3, chloride 101, bicarb 26, BUN 3, creatinine 0.8, glucose 149.  White blood cell count 6.6, hemoglobin 11.4, hematocrit 32.9,  platelets 123.  Magnesium 1.9 Dictated by:   Bonnell Public, M.D. Attending Physician:  Madaline Guthrie DD:  12/16/01 TD:  12/17/01 Job: [id number]

## 2011-05-18 NOTE — Discharge Summary (Signed)
NAMESEUNG, NIDIFFER            ACCOUNT NO.:  [id number]   MEDICAL RECORD NO.:  [id number]          PATIENT TYPE:  INP   LOCATION:  5712                         FACILITY:  [facility]   PHYSICIAN:  Fransisco Hertz, M.D.  DATE OF BIRTH:  08/03/1941   DATE OF ADMISSION:  05/17/2005  DATE OF DISCHARGE:  05/20/2005                                 DISCHARGE SUMMARY   CONSULTANTS:  None.   DISCHARGE DIAGNOSES:  1.  Orthostatic hypotension secondary to intractible nausea and vomiting      secondary to alcoholic gastritis.  2.  Hypertension.  3.  Chronic pancreatitis.  4.  Benign prostatic hypertrophy.  5.  Ongoing alcohol abuse.  6.  Type 2 diabetes mellitus.  7.  Alcohol induced pancytopenia.  8.  Hepatitis C.  9.  History of acute renal failure secondary to orthostatic hypotension and      volume depletion resolved.   DISCHARGE MEDICATIONS:  1.  Insulin 70/30, 30 units each morning.  2.  Insulin 70/30, 20 units each evening.  3.  Benazepril 10 mg daily.  4.  Glipizide 10 mg daily.  5.  Metformin 500 mg twice daily.  6.  Folic acid 1 mg daily.  7.  Multivitamins 1 daily.  8.  Thiamin 100 mg daily.   DISPOSITION:  The patient is discharge in good condition.  He is discharged  to home.  The patient was instructed to completely avoid any alcohol intake.   FOLLOWUP:  The patient is to follow up with his primary care physician in  the internal medicine outpatient clinic.  At that time a basic metabolic  panel as well as a CBC will be checked.   DISCHARGE LABORATORY DATA:  Phosphorus 2.4, magnesium 1.4, sodium 137,  potassium 3.9, chloride 108, bicarbonate 21, glucose 113, BUN 5, creatinine  0.8, calcium 8.0.  WBCs 3.0, hemoglobin 11.5, hematocrit 33.1, platelets 54.  AST 66, ALT 67, total protein 6.5, albumin 3.1, calcium 8.3, total bilirubin  2.3, alkaline phosphatase 108.   IMAGING:  On 5/16 abdominal ultrasound showing cholelithiasis with  gallbladder sludge, but no acute  cholecystitis.  No biliary dilatation.  Echogenic liver.  Atrophic pancreas.  No ascites.   HISTORY OF PRESENT ILLNESS:  Please refer to the patient's medical records  for complete history and physical.  Briefly, Eddie [name] is a 70 year old  gentleman who presented to the outpatient clinic with complaints of  intractable nausea and vomiting following continued alcohol abuse.  The  patient had spent time with his friends and had at least two alcohol binges  3 days prior to admission.  Following these alcohol binges, he had  intractable nausea and vomiting with left upper quadrant pain.  He had been  unable to keep any oral intake down.  The patient also complains of  intractable hiccups and lightheadedness upon standing up.  The patient  complained of no cough, fever or chills.   ADMISSION PHYSICAL EXAMINATION:  VITAL SIGNS:  Blood pressure lying down  141/84 with a pulse of 115.  Seated position 115/77 with a pulse of 123.  Standing 114/78 with a  pulse of 144.  Temperature 98.2, respirations 16,  saturation 98% on room air.  GENERAL:  The patient was in no acute distress.  HEENT:  Pupils were equal, round, reactive to light.  Extraocular muscles  were intact.  Sclerae was anicteric.  Conjunctiva was pale and dry.  The  patient had poor skin turgor.  ENT exam was within normal limits.  NECK:  No thyromegaly, tracheal deviation, carotid bruits.  CARDIOVASCULAR EXAM:  S1, S2, tachycardic, no murmurs, rubs, or gallops.  LUNG EXAM:  Bilaterally clear to auscultation.  No rales, rhonchi, or  wheezes.  ABDOMEN:  Soft.  Some mild tenderness to deep palpation in the left upper  quadrant region.  There is no rebound.  Positive bowel sounds.  No evidence  of hepatosplenomegaly.  EXTREMITIES:  Were without edema.  NEUROLOGICAL EXAM:  Grossly nonfocal.   HOSPITAL COURSE:  Problem 1. Orthostatic hypotension.  This is likely  secondary to volume depletion secondary to intractable nausea and  vomiting  which is likely related to patient's alcohol induced gastritis.  The patient  was admitted to the medicine ward, kept n.p.o. status and started on  aggressive intravenous hydration.  The patient's blood pressure medications  were held.  He was continued to be aggressively hydrated throughout his  hospitalization.  By the time of discharge, the patient's blood pressure was  no longer orthostatic and his blood pressure medications were reintroduced.  The patient was stable and without symptoms of orthostasis.  He denied any  lightheadedness, dizziness or nausea and vomiting at time of discharge.   Problem 2.  Alcohol induced gastritis with nausea and vomiting.  The patient  was treated with intravenous Phenergan initially and converted to oral  Phenergan once oral intake was tolerated.  The patient's symptoms resolved  with aggressive hydration and continued absence from alcohol.  The patient  was initially started on intravenous Ativan for withdrawal precautions.  Again once oral intake was tolerated, the patient was converted to oral  Ativan.  There was no evidence of withdrawal or seizure throughout his  hospitalization.   Problem 3.  Diabetes mellitus.  The patient was continued on his oral  regimen of 70/30 insulin, glipizide, metformin.  The patient was instructed  to check his blood glucose with each meal and in the evening.  He will  follow up for his diabetes with his primary care physician.   Problem 4.  Acute renal failure.  This was thought to be secondary to his  intractable vomiting and volume depletion.  With appropriate volume  resuscitation, the patient's renal failure resolved and BUN and creatinine  returned to baseline.   Problem 5.  Thrombocytopenia.  The patient's platelets remained stable  throughout his hospitalization.  His thrombocytopenia was thought to be  secondary to bone marrow suppression.  Problem 6.  Bone marrow suppression secondary to  continued alcohol abuse.  The patient encouraged strongly to discontinue all alcohol abuse at this  time.      Chapman Fitch, MD  Electronically Signed     ______________________________  Fransisco Hertz, M.D.    IO/MEDQ  D:  07/14/2005  T:  07/15/2005  Job:  [id number]

## 2011-05-18 NOTE — Op Note (Signed)
[facility]  Patient:    Eddie [name], Eddie [name]                   MRN: [id number] Proc. Date: 04/29/01 Adm. Date:  [id number] Attending:  Londell Moh                           Operative Report  PREOPERATIVE DIAGNOSIS:  Benign prostatic hypertrophy with urinary retention.  POSTOPERATIVE DIAGNOSIS:  Benign prostatic hypertrophy with urinary retention.  PROCEDURE:  Transurethral resection of prostate.  SURGEON:  Jamison Neighbor, M.D.  ANESTHESIA:  General.  COMPLICATIONS:  None.  DRAIN:  24-French [place] Foley catheter.  BRIEF HISTORY:  This 70 year old male has had intermittent episodes of urinary retention. He has been on alpha blockade, as well as Proscar.  The patient was scheduled for a TURP but then began to feel he voided better.  He subsequently returned back to the ER within a few weeks later, once again in retention. The patient has had a Foley catheter in place and is now to undergo a TURP. He understands the risks and benefits of the procedure.  He was even informed that he will have retrograde ejaculations and he might have some problems with urinary control and that he will have problems with hematuria for several weeks.  He also realized that the catheter will be in for a minimum of 48 hours.  He gave a full and informed consent.  PROCEDURE:  After the successful induction of general anesthesia, the patient was placed in the dorsolithotomy position and prepped with Betadine and draped in the usual sterile fashion. The urethra was dilated to 30 [place] with [employer].  The Olympus continuous flow resectoscope was then inserted, and using a Timberlake obturator, the ______ resectoscope was inserted with a 12-degree lens in place.  The patients bladder was carefully inspected and was free of any tumor or stones.  Both ureteral orifices were normal in configuration and location. They were well back from the intended line  of resection.  The patient had modest trabeculation, but no diverticula or cellules were noted.  The median lobe was carefully taken down, beginning at the bladder neck and extending out to the verumontanum.  Once this had been flattened out, the patient underwent resection of the right lateral lobe, starting at the 11 oclock position and extending down to the floor of the prostate.  This was taken down to the surgical capsule and down as far as the verumontanum.  An identical procedure was performed on the opposite side.  A small amount of anterior lobe tissue was resected. Hemostasis was obtained with the electrocautery.  All chips were irrigated from the bladder.  Careful rectal examination showed that the prostate had been well-resected with little, if any, residual tissue. The visual inspection showed that prostatic fossa was wide-open.  There was little, if any, residual tissue. The veru and sphincter mechanisms were intact.  The bladder ______, and the ureteral orifices were not injured.  The chips had all been successfully removed.  The resectoscope was removed. A 24 French three-way Foley catheter as inserted. This was filled with 30 cc and placed on traction. This irrigated freely.  The patient tolerated the procedure well and was taken to the recovery room in good condition. DD:  04/29/01 TD:  04/29/01 Job: [id number]

## 2011-05-25 ENCOUNTER — Other Ambulatory Visit: Payer: Self-pay | Admitting: *Deleted

## 2011-05-25 DIAGNOSIS — E119 Type 2 diabetes mellitus without complications: Secondary | ICD-10-CM

## 2011-05-25 MED ORDER — INSULIN GLARGINE 100 UNIT/ML ~~LOC~~ SOLN: 22.0000 [IU] | Freq: Every day | SUBCUTANEOUS | Status: DC

## 2011-06-18 ENCOUNTER — Emergency Department (HOSPITAL_COMMUNITY): Admission: EM | Admit: 2011-06-18 | Payer: Medicare Other | Source: Home / Self Care

## 2011-08-07 ENCOUNTER — Encounter: Payer: Self-pay | Admitting: Internal Medicine

## 2011-08-07 ENCOUNTER — Ambulatory Visit (INDEPENDENT_AMBULATORY_CARE_PROVIDER_SITE_OTHER): Payer: Medicare Other | Admitting: Internal Medicine

## 2011-08-07 VITALS — BP 137/84 | HR 81 | Temp 97.0°F | Ht 72.0 in | Wt 175.1 lb

## 2011-08-07 DIAGNOSIS — I10 Essential (primary) hypertension: Secondary | ICD-10-CM

## 2011-08-07 DIAGNOSIS — N508 Other specified disorders of male genital organs: Secondary | ICD-10-CM

## 2011-08-07 DIAGNOSIS — N5089 Other specified disorders of the male genital organs: Secondary | ICD-10-CM

## 2011-08-07 DIAGNOSIS — F1010 Alcohol abuse, uncomplicated: Secondary | ICD-10-CM

## 2011-08-07 DIAGNOSIS — K922 Gastrointestinal hemorrhage, unspecified: Secondary | ICD-10-CM

## 2011-08-07 DIAGNOSIS — E119 Type 2 diabetes mellitus without complications: Secondary | ICD-10-CM

## 2011-08-07 MED ORDER — BACITRACIN-NEOMYCIN-POLYMYXIN 400-5-5000 EX OINT: TOPICAL_OINTMENT | Freq: Two times a day (BID) | CUTANEOUS | Status: DC

## 2011-08-07 MED ORDER — ONE-DAILY MULTI VITAMINS PO TABS: 1.0000 | ORAL_TABLET | Freq: Every day | ORAL | Status: DC

## 2011-08-07 MED ORDER — INSULIN GLARGINE 100 UNIT/ML ~~LOC~~ SOLN: SUBCUTANEOUS | Status: DC

## 2011-08-07 MED ORDER — CARVEDILOL 3.125 MG PO TABS: 3.1250 mg | ORAL_TABLET | Freq: Two times a day (BID) | ORAL | Status: DC

## 2011-08-07 MED ORDER — OMEPRAZOLE 40 MG PO CPDR: 40.0000 mg | DELAYED_RELEASE_CAPSULE | Freq: Every day | ORAL | Status: DC

## 2011-08-07 MED ORDER — LANCETS MISC: Status: DC

## 2011-08-07 MED ORDER — FOLIC ACID 1 MG PO TABS: 1.0000 mg | ORAL_TABLET | Freq: Every day | ORAL | Status: DC

## 2011-08-07 MED ORDER — GLUCOSE BLOOD VI STRP: ORAL_STRIP | Status: DC

## 2011-08-07 NOTE — Progress Notes (Signed)
Subjective:   Patient ID: Eddie [name] male   DOB: June 28, 1941 70 y.o.   MRN: [id number]  HPI: Eddie [name] is a 70 y.o. with past medical history significant for type 2 diabetes mellitus, hypertension, alcohol abuse comes to the clinic for a followup visit today.   He reports having some genital soreness for about  2 weeks.  He also noticed some blood at that lesion and has been using Neosporin ointment for about 2 weeks but it's not healing well and he noticed some blood yesterday as well( a day prior to his appointment). He reports some associated redness but denies any swelling. He denies any dysuria urgency or frequency, fever, chills nausea or vomiting. He is not sexually active and his last intercourse was 7 years ago. On asking in retrospect, he states that he cut down his hairs from the pubic region around 2 weeks ago( the same time).  For his diabetes he has not been checking his blood sugars regularly but he has been taking his insulin regularly. He is requesting for a new meter.   He reports some off and on abdominal discomfort but denies noticing any blood in his stools lately.  Past Medical History  Diagnosis Date  . Cancer     prostate ca  . Cancer of colon   . Cancer     tonsillar  . Upper GI bleeding     EGD on 01/2011 shows esophagitis, grade 1 varices  . Hypertension   . Cirrhosis   . Anemia   . Diabetes mellitus   . Hepatitis C   . Gallstone   . Thrush, oral   . GERD (gastroesophageal reflux disease)   . DM gastroparesis   . Gastritis   . Hernia   . Pancreatitis    Current Outpatient Prescriptions  Medication Sig Dispense Refill  . aspirin 81 MG EC tablet Take 81 mg by mouth daily.        . carvedilol (COREG) 3.125 MG tablet Take 1 tablet (3.125 mg total) by mouth 2 (two) times daily with a meal.  60 tablet  11  . folic acid (FOLVITE) 1 MG tablet Take 1 mg by mouth daily.        Marland Kitchen glucose blood (ACCU-CHEK AVIVA) test strip Use to test blood  glucose 3 times daily       . insulin glargine (LANTUS) 100 UNIT/ML injection Inject 22 Units into the skin at bedtime.  10 mL  11  . Insulin Syringe-Needle U-100 (ELITE-THIN INS SYR .5CC/31G) 31G X 5/16" 0.5 ML MISC Use as directed       . Lancets MISC Use to test blood glucose 3 times daily       . Multiple Vitamin (MULTIVITAMIN) tablet Take 1 tablet by mouth daily.        Marland Kitchen omeprazole (PRILOSEC) 40 MG capsule Take 1 capsule (40 mg total) by mouth daily.  30 capsule  11   Family History  Problem Relation Age of Onset  . Breast cancer Mother   . Alzheimer's disease Mother    History   Social History  . Marital Status: Single    Spouse Name: N/A    Number of Children: N/A  . Years of Education: N/A   Social History Main Topics  . Smoking status: Never Smoker   . Smokeless tobacco: Never Used  . Alcohol Use: No  . Drug Use: No  . Sexually Active: Not Currently   Other Topics Concern  .  None   Social History Narrative  . None   Review of Systems: Constitutional: Denies fever, chills, diaphoresis, appetite change and fatigue.  HEENT: Denies photophobia, eye pain, redness, hearing loss, ear pain, congestion, sore throat, rhinorrhea, sneezing, mouth sores, trouble swallowing, neck pain, neck stiffness and tinnitus.   Respiratory: Denies SOB, DOE, cough, chest tightness,  and wheezing.   Cardiovascular: Denies chest pain, palpitations and leg swelling.  Gastrointestinal: Denies nausea, vomiting, abdominal pain, diarrhea, constipation, blood in stool and abdominal distention.  Genitourinary: Denies dysuria, urgency, frequency, hematuria, flank pain and difficulty urinating.  Musculoskeletal: Denies myalgias, back pain, joint swelling, arthralgias and gait problem.  Skin: Denies pallor, rash and wound.  Neurological: Denies dizziness, seizures, syncope, weakness, light-headedness, numbness and headaches.  Hematological: Denies adenopathy. Easy bruising, personal or family bleeding  history  Psychiatric/Behavioral: Denies suicidal ideation, mood changes, confusion, nervousness, sleep disturbance and agitation  Objective:  Physical Exam: Filed Vitals:   08/07/11 1031  BP: 137/84  Pulse: 81  Temp: 97 F (36.1 C)  TempSrc: Oral  Height: 6' (1.829 m)  Weight: 175 lb 1.6 oz (79.425 kg)   Constitutional: Vital signs reviewed.  Patient is a well-developed and well-nourished  in no acute distress and cooperative with exam. Alert and oriented x3.  Head: Normocephalic and atraumatic Ear: TM normal bilaterally Mouth: no erythema or exudates, MMM Eyes: PERRL, EOMI, conjunctivae normal, No scleral icterus.  Neck: Supple, Trachea midline normal ROM, No JVD, mass, thyromegaly, or carotid bruit present.  Cardiovascular: RRR, S1 normal, S2 normal, no MRG, pulses symmetric and intact bilaterally Pulmonary/Chest: CTAB, no wheezes, rales, or rhonchi Abdominal: Soft, periumbilical hernia Non-tender, non-distended, bowel sounds are normal, no masses, organomegaly, or guarding present.  GU: a very small 1 mm x4 mm cut in the surrounding penis skin on the left side, no active drainage or discharge, no scrotal swelling or inguinal lymphadenopathy noted  Musculoskeletal: No joint deformities, erythema, or stiffness, ROM full and no nontender Hematology: no cervical, inginal, or axillary adenopathy.  Neurological: A&O x3, Strenght is normal and symmetric bilaterally, cranial nerve II-XII are grossly intact, no focal motor deficit, sensory intact to light touch bilaterally.  Skin: Warm, dry and intact. No rash, cyanosis, or clubbing.  Psychiatric: Normal mood and affect. speech and behavior is normal. Judgment and thought content normal. Cognition and memory are normal.    Assessment & Plan:

## 2011-08-07 NOTE — Assessment & Plan Note (Signed)
He got up a small cut in his genitourinary area while he was cutting his pubic hairs 2 weeks ago. On exam the lesion doesn't look infected. He has been using Neosporin ointment which has been helping him. He was advised to continue using the same. He was advised to call the clinic if lesion gets worse or  he starts noticing any active  drainage. Voices clear understanding

## 2011-08-07 NOTE — Patient Instructions (Addendum)
Please schedule a follow up appointment in 2 weeks. Please take your medicines as prescribed. Please get your glucometer along with you with the next clinic appointment.

## 2011-08-07 NOTE — Assessment & Plan Note (Signed)
Blood pressure well controlled. Continue him on the current regimen  Lab Results  Component Value Date   NA 133* 03/01/2011   NA 140 02/16/2011   K 4.1 03/01/2011   K 4.2 02/16/2011   CL 101 03/01/2011   CL 104 02/16/2011   CO2 23 03/01/2011   CO2 27 02/16/2011   BUN 12 03/01/2011   BUN 13 02/16/2011   CREATININE 1.00 03/01/2011   CREATININE 1.08 02/07/2011    BP Readings from Last 3 Encounters:  08/07/11 137/84  05/01/11 112/60  03/21/11 137/75

## 2011-08-07 NOTE — Assessment & Plan Note (Signed)
He was not checking his blood sugars and did not get his glucometer today. But his A1c had deteriorated from 7.3-11.7 today. Our records show that he should have been taking 22 units of Lantus but he states that he was using 20 units. We called the pharmacy to check on his medication compliance and it seems like he has been filling up his prescriptions regularly. We'll increase his dose of Lantus from 20-25 units today. He was also seen by our diabetes educator and Counsellor, who discussed the techniques to administer insulin with him. He was advised to followup with [place] in 2 weeks and get his glucose meter with him so that further adjustments in diabetic regimen if needed could be made.  Lab Results  Component Value Date   HGBA1C 11.7 08/07/2011   HGBA1C  Value: 7.3 (NOTE)                                                                       According to the ADA Clinical Practice Recommendations for 2011, when HbA1c is used as a screening test:   >=6.5%   Diagnostic of Diabetes Mellitus           (if abnormal result  is confirmed)  5.7-6.4%   Increased risk of developing Diabetes Mellitus  References:Diagnosis and Classification of Diabetes Mellitus,Diabetes Care,2011,34(Suppl 1):S62-S69 and Standards of Medical Care in         Diabetes - 2011,Diabetes Care,2011,34  (Suppl 1):S11-S61.* 02/06/2011   CREATININE 1.00 03/01/2011   CREATININE 1.08 02/07/2011   MICROALBUR 2.06* 01/23/2010   MICRALBCREAT 11.4 01/23/2010   CHOL  Value: 109        ATP III CLASSIFICATION:  <200     mg/dL   Desirable  045-409  mg/dL   Borderline High  >=811    mg/dL   High        08/31/4781   HDL 38* 02/06/2011   TRIG 95 02/06/2011

## 2011-08-07 NOTE — Assessment & Plan Note (Signed)
Stable.  No more episodes. We'll continue him on omeprazole and coreg for now.

## 2011-08-09 ENCOUNTER — Encounter: Payer: Self-pay | Admitting: Internal Medicine

## 2011-08-21 ENCOUNTER — Ambulatory Visit (INDEPENDENT_AMBULATORY_CARE_PROVIDER_SITE_OTHER): Payer: Medicare Other | Admitting: Internal Medicine

## 2011-08-21 ENCOUNTER — Encounter: Payer: Self-pay | Admitting: Internal Medicine

## 2011-08-21 DIAGNOSIS — E119 Type 2 diabetes mellitus without complications: Secondary | ICD-10-CM

## 2011-08-21 DIAGNOSIS — I10 Essential (primary) hypertension: Secondary | ICD-10-CM

## 2011-08-21 DIAGNOSIS — Z0000 Encounter for general adult medical examination without abnormal findings: Secondary | ICD-10-CM

## 2011-08-21 DIAGNOSIS — Z23 Encounter for immunization: Secondary | ICD-10-CM

## 2011-08-21 LAB — GLUCOSE, CAPILLARY: Glucose-Capillary: 266 mg/dL — ABNORMAL HIGH (ref 70–99)

## 2011-08-21 MED ORDER — GLUCOSE BLOOD VI STRP: ORAL_STRIP | Status: DC

## 2011-08-21 MED ORDER — ACCU-CHEK FASTCLIX LANCETS MISC: 1.0000 | Freq: Two times a day (BID) | Status: DC

## 2011-08-21 MED ORDER — ACCU-CHEK NANO SMARTVIEW W/DEVICE KIT: 1.0000 | PACK | Freq: Two times a day (BID) | Status: DC

## 2011-08-21 NOTE — Assessment & Plan Note (Addendum)
Some confusion on our part-never got a meter. Appreciate Jamison Neighbor seeing the patient today with me!. He was given a meter, some lancets and strips. The prescription was called to his pharmacy.  Maryclare Labrador continue him on current dose of Lantus in the absence of any readings today.  -Will check Urine micro albumin/creatinie ratio. -Will get ophthalmology referral for diabetic eye exam. -Will do a foot exam. -he was advised to follow up in 2-3 weeks with meter so that further adjustments if needed could be done based on his readings. I anticipate that we may need to go up on his lantus because of significant deterioration in A1C in 6 months( 7.3 in 02/12 to 11.7 in 08/12)

## 2011-08-21 NOTE — Assessment & Plan Note (Signed)
Lab Results  Component Value Date   NA 133* 03/01/2011   NA 140 02/16/2011   K 4.1 03/01/2011   K 4.2 02/16/2011   CL 101 03/01/2011   CL 104 02/16/2011   CO2 23 03/01/2011   CO2 27 02/16/2011   BUN 12 03/01/2011   BUN 13 02/16/2011   CREATININE 1.00 03/01/2011   CREATININE 1.08 02/07/2011    BP Readings from Last 3 Encounters:  08/21/11 147/87  08/07/11 137/84  05/01/11 112/60    Assessment: Hypertension control:  mildly elevated  Progress toward goals:  deteriorated Barriers to meeting goals:  no barriers identified  Plan: Hypertension treatment:  continue current medications. Will continue to monitor.

## 2011-08-21 NOTE — Assessment & Plan Note (Signed)
We gave him Tdap. He refused flu- shot.

## 2011-08-21 NOTE — Progress Notes (Signed)
  Subjective:    Patient ID: Eddie [name], male    DOB: October 18, 1941, 70 y.o.   MRN: [id number]  HPI: A 70 year old man with past medical history significant for type 2 diabetes mellitus, hypertension comes to the clinic for a followup visit.  He was seen in our clinic 2 weeks ago for diabetes and genital soreness.Marland Kitchen His AIC deteriorated in the last  6 months( 7.3 in 02/12 to 11.7 in 08/12 likely secondary to poor control( was not checking his blood sugars as he did not have meter). His insulin was increased and was advised to follow up in 2 weeks with meter and readings.  There was some confusion and apparently he never got a meter- so he followed up today again without a meter.  He states that his genital lesions are getting better.  He reports some blurry vision but denies any headaches. Also  denies any chest pain, SOB, fever, N/V/D.  Review of Systems  Constitutional: Negative for fever, activity change, appetite change and fatigue.  HENT: Negative for ear pain, congestion, rhinorrhea, sneezing, mouth sores, trouble swallowing, neck pain, voice change and postnasal drip.   Eyes: Positive for visual disturbance.  Respiratory: Negative for apnea, cough, choking, chest tightness and shortness of breath.   Cardiovascular: Negative for chest pain, palpitations and leg swelling.  Gastrointestinal: Negative for abdominal pain, diarrhea and blood in stool.  Genitourinary: Negative for dysuria, urgency and difficulty urinating.  Musculoskeletal: Negative for arthralgias.  Neurological: Negative for dizziness and headaches.  Hematological: Negative for adenopathy.       Objective:   Physical Exam  Constitutional: He is oriented to person, place, and time. He appears well-developed and well-nourished. No distress.  HENT:  Head: Normocephalic and atraumatic.  Eyes: Conjunctivae and EOM are normal. Pupils are equal, round, and reactive to light.  Neck: Normal range of motion. Neck supple. No  JVD present. No tracheal deviation present. No thyromegaly present.  Cardiovascular: Normal rate, regular rhythm, normal heart sounds and intact distal pulses.  Exam reveals no gallop and no friction rub.   No murmur heard. Pulmonary/Chest: Effort normal and breath sounds normal. No stridor. No respiratory distress. He has no wheezes. He has no rales. He exhibits no tenderness.  Abdominal: Soft. Bowel sounds are normal. He exhibits no distension and no mass. There is no tenderness. There is no rebound and no guarding.  Musculoskeletal: Normal range of motion. He exhibits no edema and no tenderness.  Lymphadenopathy:    He has no cervical adenopathy.  Neurological: He is alert and oriented to person, place, and time. He has normal reflexes. He displays normal reflexes. No cranial nerve deficit. He exhibits normal muscle tone. Coordination normal.  Skin: Skin is warm. He is not diaphoretic.          Assessment & Plan:

## 2011-08-21 NOTE — Patient Instructions (Signed)
Please schedule a follow up appointment in 2-3 weeks. Please get your meter along with your next clinic appointment. We will call you with the date and time for opthalmology appointment.

## 2011-08-22 ENCOUNTER — Telehealth: Payer: Self-pay | Admitting: Dietician

## 2011-08-22 LAB — MICROALBUMIN / CREATININE URINE RATIO: Microalb Creat Ratio: 154 mg/g — ABNORMAL HIGH (ref 0.0–30.0)

## 2011-08-22 NOTE — Telephone Encounter (Signed)
Called patient to assess retention of information about how to use blood glucose meter.Having trouble using meter that was given to him  And he was taught to use with repeat demonstration yesterday. He will bring meter to office more teaching and review in am.  Flag Dr. Dorthula Rue and PCP to make them aware.

## 2011-08-23 ENCOUNTER — Ambulatory Visit: Payer: Medicare Other | Admitting: Dietician

## 2011-09-04 ENCOUNTER — Telehealth: Payer: Self-pay | Admitting: *Deleted

## 2011-09-04 NOTE — Telephone Encounter (Signed)
Called patient to follow-up on TwinRix Booster.  He states he had it done.

## 2011-09-04 NOTE — Telephone Encounter (Signed)
Message copied by Francia Greaves on Tue Sep 04, 2011 11:04 AM ------      Message from: Francia Greaves      Created: Mon Mar 05, 2011  2:43 PM       Pt. Needs twinrix booster by 08/03/11

## 2011-09-17 ENCOUNTER — Other Ambulatory Visit: Payer: Self-pay | Admitting: Internal Medicine

## 2011-09-17 ENCOUNTER — Encounter (HOSPITAL_BASED_OUTPATIENT_CLINIC_OR_DEPARTMENT_OTHER): Payer: Medicare Other | Admitting: Internal Medicine

## 2011-09-17 ENCOUNTER — Ambulatory Visit (HOSPITAL_COMMUNITY)
Admission: RE | Admit: 2011-09-17 | Discharge: 2011-09-17 | Disposition: A | Discharge status: Home / Self Care | Payer: Medicare Other | Source: Ambulatory Visit | Attending: Internal Medicine | Admitting: Internal Medicine

## 2011-09-17 DIAGNOSIS — I868 Varicose veins of other specified sites: Secondary | ICD-10-CM | POA: Insufficient documentation

## 2011-09-17 DIAGNOSIS — I6529 Occlusion and stenosis of unspecified carotid artery: Secondary | ICD-10-CM | POA: Insufficient documentation

## 2011-09-17 DIAGNOSIS — C185 Malignant neoplasm of splenic flexure: Secondary | ICD-10-CM

## 2011-09-17 DIAGNOSIS — C61 Malignant neoplasm of prostate: Secondary | ICD-10-CM

## 2011-09-17 DIAGNOSIS — K55059 Acute (reversible) ischemia of intestine, part and extent unspecified: Secondary | ICD-10-CM | POA: Insufficient documentation

## 2011-09-17 DIAGNOSIS — I2510 Atherosclerotic heart disease of native coronary artery without angina pectoris: Secondary | ICD-10-CM | POA: Insufficient documentation

## 2011-09-17 DIAGNOSIS — D696 Thrombocytopenia, unspecified: Secondary | ICD-10-CM

## 2011-09-17 DIAGNOSIS — C099 Malignant neoplasm of tonsil, unspecified: Secondary | ICD-10-CM | POA: Insufficient documentation

## 2011-09-17 DIAGNOSIS — M47812 Spondylosis without myelopathy or radiculopathy, cervical region: Secondary | ICD-10-CM | POA: Insufficient documentation

## 2011-09-17 DIAGNOSIS — K8689 Other specified diseases of pancreas: Secondary | ICD-10-CM | POA: Insufficient documentation

## 2011-09-17 DIAGNOSIS — C189 Malignant neoplasm of colon, unspecified: Secondary | ICD-10-CM | POA: Insufficient documentation

## 2011-09-17 DIAGNOSIS — K7460 Unspecified cirrhosis of liver: Secondary | ICD-10-CM | POA: Insufficient documentation

## 2011-09-17 DIAGNOSIS — I8500 Esophageal varices without bleeding: Secondary | ICD-10-CM | POA: Insufficient documentation

## 2011-09-17 DIAGNOSIS — I658 Occlusion and stenosis of other precerebral arteries: Secondary | ICD-10-CM | POA: Insufficient documentation

## 2011-09-17 DIAGNOSIS — K8020 Calculus of gallbladder without cholecystitis without obstruction: Secondary | ICD-10-CM | POA: Insufficient documentation

## 2011-09-17 LAB — CMP (CANCER CENTER ONLY)
ALT(SGPT): 57 U/L — ABNORMAL HIGH (ref 10–47)
Alkaline Phosphatase: 105 U/L — ABNORMAL HIGH (ref 26–84)
CO2: 26 mEq/L (ref 18–33)
Potassium: 4.2 mEq/L (ref 3.3–4.7)
Sodium: 142 mEq/L (ref 128–145)
Total Bilirubin: 1.1 mg/dl (ref 0.20–1.60)
Total Protein: 8 g/dL (ref 6.4–8.1)

## 2011-09-17 LAB — CBC WITH DIFFERENTIAL/PLATELET
BASO%: 0.4 % (ref 0.0–2.0)
LYMPH%: 25 % (ref 14.0–49.0)
MCHC: 32.3 g/dL (ref 32.0–36.0)
MONO#: 0.3 10*3/uL (ref 0.1–0.9)
Platelets: 64 10*3/uL — ABNORMAL LOW (ref 140–400)
RBC: 4.37 10*6/uL (ref 4.20–5.82)
RDW: 16.8 % — ABNORMAL HIGH (ref 11.0–14.6)
WBC: 2.6 10*3/uL — ABNORMAL LOW (ref 4.0–10.3)

## 2011-09-17 LAB — PSA: PSA: 0.55 ng/mL (ref <=4.00)

## 2011-09-17 MED ORDER — IOHEXOL 300 MG/ML  SOLN
100.0000 mL | Freq: Once | INTRAMUSCULAR | Status: AC | PRN
  Administered 2011-09-17: 100 mL via INTRAVENOUS

## 2011-09-19 ENCOUNTER — Encounter (HOSPITAL_BASED_OUTPATIENT_CLINIC_OR_DEPARTMENT_OTHER): Payer: Medicare Other | Admitting: Internal Medicine

## 2011-09-19 DIAGNOSIS — C61 Malignant neoplasm of prostate: Secondary | ICD-10-CM

## 2011-09-19 DIAGNOSIS — C090 Malignant neoplasm of tonsillar fossa: Secondary | ICD-10-CM

## 2011-09-19 DIAGNOSIS — K55059 Acute (reversible) ischemia of intestine, part and extent unspecified: Secondary | ICD-10-CM

## 2011-09-19 DIAGNOSIS — D0000 Carcinoma in situ of oral cavity, unspecified site: Secondary | ICD-10-CM

## 2011-09-19 DIAGNOSIS — D0008 Carcinoma in situ of pharynx: Secondary | ICD-10-CM

## 2011-09-21 ENCOUNTER — Other Ambulatory Visit: Payer: Self-pay | Admitting: Internal Medicine

## 2011-09-21 ENCOUNTER — Telehealth: Payer: Self-pay | Admitting: *Deleted

## 2011-09-21 DIAGNOSIS — C140 Malignant neoplasm of pharynx, unspecified: Secondary | ICD-10-CM

## 2011-09-21 NOTE — Telephone Encounter (Signed)
Call from [facility] from Dr. Arbutus Ped office at the [facility] pt has reoccurring Pharyngeal Cancer.  Pt needs a referral to Dr. Cameron Ali within 1 to 2 weeks.  Pt has Medicaid [place] Access.

## 2011-09-24 LAB — DIFFERENTIAL
Basophils Absolute: 0
Basophils Absolute: 0
Basophils Absolute: 0
Basophils Absolute: 0
Basophils Absolute: 0
Basophils Relative: 0
Basophils Relative: 0
Eosinophils Absolute: 0
Eosinophils Absolute: 0
Eosinophils Absolute: 0
Eosinophils Absolute: 0
Eosinophils Relative: 0
Eosinophils Relative: 1
Eosinophils Relative: 0
Lymphocytes Relative: 10 — ABNORMAL LOW
Lymphocytes Relative: 11 — ABNORMAL LOW
Lymphocytes Relative: 6 — ABNORMAL LOW
Monocytes Absolute: 0.3
Monocytes Absolute: 0.5
Monocytes Relative: 24 — ABNORMAL HIGH
Monocytes Relative: 25 — ABNORMAL HIGH
Neutro Abs: 0.8 — ABNORMAL LOW
Neutro Abs: 0.9 — ABNORMAL LOW
Neutrophils Relative %: 66
Neutrophils Relative %: 68
Neutrophils Relative %: 78 — ABNORMAL HIGH

## 2011-09-24 LAB — BASIC METABOLIC PANEL
BUN: 14
BUN: 16
BUN: 21
BUN: 13
BUN: 15
CO2: 30
CO2: 27
CO2: 27
CO2: 31
Calcium: 8.2 — ABNORMAL LOW
Calcium: 8.4
Calcium: 8.1 — ABNORMAL LOW
Calcium: 8.4
Calcium: 8.6
Chloride: 101
Creatinine, Ser: 1.05
Creatinine, Ser: 1.26
GFR calc non Af Amer: 58 — ABNORMAL LOW
GFR calc non Af Amer: >60
GFR calc non Af Amer: >60
GFR calc non Af Amer: >60
Glucose, Bld: 156 — ABNORMAL HIGH
Glucose, Bld: 246 — ABNORMAL HIGH
Glucose, Bld: 407 — ABNORMAL HIGH
Glucose, Bld: 235 — ABNORMAL HIGH
Glucose, Bld: 352 — ABNORMAL HIGH
Glucose, Bld: 67 — ABNORMAL LOW
Glucose, Bld: 68 — ABNORMAL LOW
Potassium: 3.4 — ABNORMAL LOW
Potassium: 3.8
Potassium: 3.8
Sodium: 130 — ABNORMAL LOW
Sodium: 132 — ABNORMAL LOW
Sodium: 139

## 2011-09-24 LAB — CBC
HCT: 27 — ABNORMAL LOW
HCT: 31.1 — ABNORMAL LOW
HCT: 31.3 — ABNORMAL LOW
Hemoglobin: 10.5 — ABNORMAL LOW
Hemoglobin: 10.2 — ABNORMAL LOW
MCHC: 34.1
MCHC: 33.8
MCHC: 34
MCHC: 34.1
MCV: 89.2
MCV: 89.9
MCV: 92.5
Platelets: 65 — ABNORMAL LOW
Platelets: 77 — ABNORMAL LOW
Platelets: 77 — ABNORMAL LOW
Platelets: 120 — ABNORMAL LOW
Platelets: 146 — ABNORMAL LOW
Platelets: 148 — ABNORMAL LOW
RBC: 3.49 — ABNORMAL LOW
RBC: 3.29 — ABNORMAL LOW
RDW: 14.6
RDW: 14.6
RDW: 14.7
RDW: 15.9 — ABNORMAL HIGH
RDW: 16.1 — ABNORMAL HIGH
RDW: 16.2 — ABNORMAL HIGH
WBC: 1.3 — CL
WBC: 3 — ABNORMAL LOW

## 2011-09-24 LAB — URINALYSIS, ROUTINE W REFLEX MICROSCOPIC
Glucose, UA: >1000 — AB
Protein, ur: NEGATIVE

## 2011-09-24 LAB — COMPREHENSIVE METABOLIC PANEL
ALT: 44
AST: 50 — ABNORMAL HIGH
Albumin: 2.9 — ABNORMAL LOW
Alkaline Phosphatase: 90
BUN: 18
CO2: 31
Chloride: 84 — ABNORMAL LOW
Chloride: 96
Creatinine, Ser: 1.16
GFR calc Af Amer: >60
GFR calc non Af Amer: >60
Potassium: 3.1 — ABNORMAL LOW
Total Bilirubin: 1.3 — ABNORMAL HIGH
Total Bilirubin: 1

## 2011-09-24 LAB — PREPARE PLATELET PHERESIS

## 2011-09-24 LAB — TYPE AND SCREEN
ABO/RH(D): B POS
Antibody Screen: NEGATIVE

## 2011-09-24 LAB — ABO/RH: ABO/RH(D): B POS

## 2011-09-24 LAB — OSMOLALITY: Osmolality: 280

## 2011-09-24 LAB — URINE CULTURE

## 2011-09-24 LAB — AMYLASE: Amylase: 51

## 2011-09-25 LAB — BASIC METABOLIC PANEL
Calcium: 8.8
Creatinine, Ser: 1.06
GFR calc Af Amer: >60
GFR calc non Af Amer: >60
Glucose, Bld: 297 — ABNORMAL HIGH
Sodium: 134 — ABNORMAL LOW

## 2011-09-26 NOTE — Telephone Encounter (Signed)
Referral was done and appointment was scheduled.

## 2011-09-27 ENCOUNTER — Encounter: Payer: Self-pay | Admitting: Internal Medicine

## 2011-09-28 ENCOUNTER — Encounter (HOSPITAL_COMMUNITY)
Admission: RE | Admit: 2011-09-28 | Discharge: 2011-09-28 | Disposition: A | Discharge status: Home / Self Care | Payer: Medicare Other | Source: Ambulatory Visit | Attending: Otolaryngology | Admitting: Otolaryngology

## 2011-09-28 ENCOUNTER — Other Ambulatory Visit (HOSPITAL_COMMUNITY): Payer: Self-pay | Admitting: Otolaryngology

## 2011-09-28 DIAGNOSIS — J392 Other diseases of pharynx: Secondary | ICD-10-CM

## 2011-09-28 LAB — CBC
HCT: 35.5 % — ABNORMAL LOW (ref 39.0–52.0)
Hemoglobin: 11.8 g/dL — ABNORMAL LOW (ref 13.0–17.0)
MCH: 30.3 pg (ref 26.0–34.0)
MCHC: 33.2 g/dL (ref 30.0–36.0)
MCV: 91 fL (ref 78.0–100.0)
Platelets: 61 10*3/uL — ABNORMAL LOW (ref 150–400)
RBC: 3.9 MIL/uL — ABNORMAL LOW (ref 4.22–5.81)
RDW: 17.1 % — ABNORMAL HIGH (ref 11.5–15.5)
WBC: 1.9 10*3/uL — ABNORMAL LOW (ref 4.0–10.5)

## 2011-09-28 LAB — COMPREHENSIVE METABOLIC PANEL
ALT: 64 U/L — ABNORMAL HIGH (ref 0–53)
AST: 70 U/L — ABNORMAL HIGH (ref 0–37)
Albumin: 3.6 g/dL (ref 3.5–5.2)
Alkaline Phosphatase: 123 U/L — ABNORMAL HIGH (ref 39–117)
BUN: 11 mg/dL (ref 6–23)
CO2: 27 mEq/L (ref 19–32)
Calcium: 9.8 mg/dL (ref 8.4–10.5)
Chloride: 103 mEq/L (ref 96–112)
Creatinine, Ser: 0.94 mg/dL (ref 0.50–1.35)
GFR calc Af Amer: >60 mL/min (ref >60)
GFR calc non Af Amer: >60 mL/min (ref >60)
Glucose, Bld: 163 mg/dL — ABNORMAL HIGH (ref 70–99)
Potassium: 3.8 mEq/L (ref 3.5–5.1)
Sodium: 138 mEq/L (ref 135–145)
Total Bilirubin: 0.7 mg/dL (ref 0.3–1.2)
Total Protein: 7.6 g/dL (ref 6.0–8.3)

## 2011-09-28 LAB — GLUCOSE, CAPILLARY: Glucose-Capillary: 114 — ABNORMAL HIGH

## 2011-09-28 LAB — PROTIME-INR
INR: 1.01 (ref 0.00–1.49)
Prothrombin Time: 13.5 seconds (ref 11.6–15.2)

## 2011-09-28 LAB — SURGICAL PCR SCREEN: Staphylococcus aureus: POSITIVE — AB

## 2011-09-28 LAB — APTT: aPTT: 30 seconds (ref 24–37)

## 2011-10-01 ENCOUNTER — Ambulatory Visit (HOSPITAL_COMMUNITY)
Admission: RE | Admit: 2011-10-01 | Discharge: 2011-10-01 | Disposition: A | Discharge status: Home / Self Care | Payer: Medicare Other | Source: Ambulatory Visit | Attending: Otolaryngology | Admitting: Otolaryngology

## 2011-10-01 ENCOUNTER — Other Ambulatory Visit: Payer: Self-pay | Admitting: Otolaryngology

## 2011-10-01 DIAGNOSIS — Z794 Long term (current) use of insulin: Secondary | ICD-10-CM | POA: Insufficient documentation

## 2011-10-01 DIAGNOSIS — E119 Type 2 diabetes mellitus without complications: Secondary | ICD-10-CM | POA: Insufficient documentation

## 2011-10-01 DIAGNOSIS — Z01810 Encounter for preprocedural cardiovascular examination: Secondary | ICD-10-CM | POA: Insufficient documentation

## 2011-10-01 DIAGNOSIS — Z923 Personal history of irradiation: Secondary | ICD-10-CM | POA: Insufficient documentation

## 2011-10-01 DIAGNOSIS — I10 Essential (primary) hypertension: Secondary | ICD-10-CM | POA: Insufficient documentation

## 2011-10-01 DIAGNOSIS — J392 Other diseases of pharynx: Secondary | ICD-10-CM | POA: Insufficient documentation

## 2011-10-01 DIAGNOSIS — Z85819 Personal history of malignant neoplasm of unspecified site of lip, oral cavity, and pharynx: Secondary | ICD-10-CM | POA: Insufficient documentation

## 2011-10-01 DIAGNOSIS — Z01812 Encounter for preprocedural laboratory examination: Secondary | ICD-10-CM | POA: Insufficient documentation

## 2011-10-01 LAB — GLUCOSE, CAPILLARY
Glucose-Capillary: 111 mg/dL — ABNORMAL HIGH (ref 70–99)
Glucose-Capillary: 83 mg/dL (ref 70–99)
Glucose-Capillary: 87 mg/dL (ref 70–99)

## 2011-10-03 NOTE — Op Note (Signed)
  Eddie [name], Eddie [name]            ACCOUNT NO.:  [id number]  MEDICAL RECORD NO.:  [id number]  LOCATION:  [facility]                         FACILITY:  [facility]  PHYSICIAN:  Faylinn Schwenn H. Pollyann Kennedy, MD     DATE OF BIRTH:  January 24, 1941  DATE OF PROCEDURE:  10/01/2011 DATE OF DISCHARGE:  10/01/2011                              OPERATIVE REPORT   PREOPERATIVE DIAGNOSIS:  Left pharyngeal mass.  POSTOPERATIVE MASS:  Left pharyngeal mass.  PROCEDURE:  Direct laryngoscopy with biopsy of left pharyngeal mass.  SURGEON:  Iraida Cragin H. Pollyann Kennedy, MD  ANESTHESIA:  General endotracheal anesthesia was used.  COMPLICATIONS:  None.  BLOOD LOSS:  Minimal.  FINDINGS:  Very subtle, slightly granular looking mucosa along the left tonsil.  No definite ulcerations seen.  No palpable mass.  Biopsies were taken and sent for pathologic evaluation.  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  He was recently found on CT imaging to have a suspected recurrence in the area and on the office exam had what looked like a small ulceration along the left tonsil anterior __________.  Risks, benefits, alternatives, and complications of the procedure were explained to the patient who seemed to understand and agreed to surgery.  PROCEDURE:  The patient was taken to the operating room and placed on the operating room table in supine position.  Following induction of general endotracheal anesthesia, the table was turned to 90 degrees. The patient was draped in a standard fashion.  A Jako laryngoscope was used to view the oropharynx, the hypopharynx, and the larynx.  He was edentulous.  There were no other abnormalities identified other than was described above the left side.  Several biopsies were taken of this area about 5 in total.  They were sent for pathologic evaluation.  Topical adrenaline was applied on pledgets to assist in hemostasis.  The scope was removed.  The patient was then awakened,  extubated, and transferred to recovery in stable condition.    Lyssa Hackley H. Pollyann Kennedy, MD    JHR/MEDQ  D:  10/01/2011  T:  10/01/2011  Job:  [id number]  Electronically Signed by Serena Colonel MD on 10/03/2011 09:32:18 PM

## 2011-10-30 ENCOUNTER — Other Ambulatory Visit: Payer: Self-pay | Admitting: Internal Medicine

## 2011-10-30 ENCOUNTER — Encounter (HOSPITAL_BASED_OUTPATIENT_CLINIC_OR_DEPARTMENT_OTHER): Payer: Medicare Other | Admitting: Internal Medicine

## 2011-10-30 DIAGNOSIS — C090 Malignant neoplasm of tonsillar fossa: Secondary | ICD-10-CM

## 2011-10-30 DIAGNOSIS — D696 Thrombocytopenia, unspecified: Secondary | ICD-10-CM

## 2011-10-30 DIAGNOSIS — C61 Malignant neoplasm of prostate: Secondary | ICD-10-CM

## 2011-10-30 DIAGNOSIS — C185 Malignant neoplasm of splenic flexure: Secondary | ICD-10-CM

## 2011-10-30 DIAGNOSIS — C099 Malignant neoplasm of tonsil, unspecified: Secondary | ICD-10-CM

## 2011-10-30 LAB — COMPREHENSIVE METABOLIC PANEL
ALT: 38 U/L (ref 0–53)
AST: 47 U/L — ABNORMAL HIGH (ref 0–37)
CO2: 21 mEq/L (ref 19–32)
Calcium: 9.3 mg/dL (ref 8.4–10.5)
Chloride: 104 mEq/L (ref 96–112)
Sodium: 135 mEq/L (ref 135–145)
Total Protein: 6.9 g/dL (ref 6.0–8.3)

## 2011-10-30 LAB — CBC WITH DIFFERENTIAL/PLATELET
BASO%: 0 % (ref 0.0–2.0)
EOS%: 4.5 % (ref 0.0–7.0)
MCHC: 32.3 g/dL (ref 32.0–36.0)
MONO#: 0.3 10*3/uL (ref 0.1–0.9)
RBC: 3.41 10*6/uL — ABNORMAL LOW (ref 4.20–5.82)
RDW: 16.2 % — ABNORMAL HIGH (ref 11.0–14.6)
WBC: 2.2 10*3/uL — ABNORMAL LOW (ref 4.0–10.3)
lymph#: 0.7 10*3/uL — ABNORMAL LOW (ref 0.9–3.3)

## 2011-10-30 LAB — LACTATE DEHYDROGENASE: LDH: 243 U/L (ref 94–250)

## 2011-11-07 ENCOUNTER — Ambulatory Visit: Payer: Medicare Other

## 2011-11-07 ENCOUNTER — Other Ambulatory Visit: Payer: Self-pay | Admitting: Internal Medicine

## 2011-11-07 ENCOUNTER — Other Ambulatory Visit (HOSPITAL_BASED_OUTPATIENT_CLINIC_OR_DEPARTMENT_OTHER): Payer: Medicare Other | Admitting: Lab

## 2011-11-07 DIAGNOSIS — D696 Thrombocytopenia, unspecified: Secondary | ICD-10-CM

## 2011-11-07 LAB — POCT INR: INR: 1.1

## 2011-11-07 NOTE — Progress Notes (Signed)
New pt visit for Coumadin Clinic.  Pt of Dr. 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  Dose of Lovenox is 120 mg/day.  Pt currently not on Coumadin.  He is being referred to our clinic today to transition to Coumadin only. Goal INR = 2-3 Planned duration of anticoagulation: 4 months of Coumadin PMH: malignancy (tonsillar, prostate & colon), cirrhosis of liver & GI bleed. Presently being evaluated for pancytopenia. Bone marrow bx. Scheduled for this coming Friday (by Dr. Arbutus Ped) to r/o PNH.  Anticoagulation will not be held for this procedure.   Meds: include ASA (low dose), multiple vitamin (contains 25 mcg of Vit K), Lovenox 120 mg inj/day (he has 15 syringes at home & several refills at [facility] pharmacy) Pt has not had any EtOH in 4 years.  He does not use street drugs & he does not smoke. He does not drive.  He mentions using a taxi.  He has a sister who sometimes can bring him to appts. His diet does contain 1 serving of collard greens every week.  Otherwise, he does not eat many green vegetables & never eats tossed salads. Today he c/o only "sore knots" in his abdomen from the Lovenox injections.  He is ready to stop taking the shots. A/P: We discussed purpose for anticoagulation & importance of using Lovenox & Coumadin right now.  He understands that when his INR reaches 2-3 goal, he will be instructed to stop the Lovenox & stay on Coumadin alone. He knows the risks of anticoagulation & when to notify our office w/ problems. We talked about keeping the amt of vit K in his diet consistent. Pt will begin Coumadin 5 mg/day tonight & continue Lovenox until given further instruction otherwise. Return in 6 days.

## 2011-11-09 ENCOUNTER — Other Ambulatory Visit: Payer: Medicare Other | Admitting: Lab

## 2011-11-09 ENCOUNTER — Telehealth: Payer: Self-pay | Admitting: Internal Medicine

## 2011-11-09 ENCOUNTER — Ambulatory Visit: Payer: Medicare Other | Admitting: Internal Medicine

## 2011-11-09 ENCOUNTER — Ambulatory Visit (HOSPITAL_BASED_OUTPATIENT_CLINIC_OR_DEPARTMENT_OTHER): Payer: Medicare Other | Admitting: Internal Medicine

## 2011-11-09 ENCOUNTER — Other Ambulatory Visit: Payer: Self-pay | Admitting: Internal Medicine

## 2011-11-09 ENCOUNTER — Other Ambulatory Visit (HOSPITAL_COMMUNITY)
Admission: RE | Admit: 2011-11-09 | Discharge: 2011-11-09 | Disposition: A | Discharge status: Home / Self Care | Payer: Medicare Other | Source: Ambulatory Visit | Attending: Internal Medicine | Admitting: Internal Medicine

## 2011-11-09 DIAGNOSIS — D61818 Other pancytopenia: Secondary | ICD-10-CM | POA: Insufficient documentation

## 2011-11-09 LAB — DIFFERENTIAL
Basophils Absolute: 0 10*3/uL (ref 0.0–0.1)
Eosinophils Relative: 4 % (ref 0–5)
Lymphocytes Relative: 26 % (ref 12–46)
Lymphs Abs: 0.7 10*3/uL (ref 0.7–4.0)
Neutro Abs: 1.5 10*3/uL — ABNORMAL LOW (ref 1.7–7.7)

## 2011-11-09 LAB — CBC
MCV: 94.4 fL (ref 78.0–100.0)
Platelets: 70 10*3/uL — ABNORMAL LOW (ref 150–400)
RBC: 3.06 MIL/uL — ABNORMAL LOW (ref 4.22–5.81)
RDW: 16.1 % — ABNORMAL HIGH (ref 11.5–15.5)
WBC: 2.8 10*3/uL — ABNORMAL LOW (ref 4.0–10.5)

## 2011-11-09 NOTE — Telephone Encounter (Signed)
gve the pt his nov 2012 appt calendar °

## 2011-11-09 NOTE — Patient Instructions (Addendum)
[facility] Discharge Instructions for Post Bone Marrow Procedure  Today you had a bone marrow biopsy and aspirate of the right hip   Please keep the pressure dressing in place for at least 24 hours.  Have someone check your dressing periodically for bleeding.  If needed you can reapply a pressure dressing to the site.  Take pain medication ibuprophen or tylenol as directed.  IF BLEEDING REOCCURS THAT SHOULD BE REPORTED IMMEDIATELY. Call the Cancer Center at [phone] if during business hours. Or report to the Emergency Room.   I have been informed and understand all the instructions given to me. I know to contact the clinic, my physician, or go to the Emergency Department if any problems should occur. I do not have any questions at this time, but understand that I may call the clinic during office hours at [phone]  should I have any questions or need assistance in obtaining follow up care.    ______Diane Alvester Morin, RN___________________________11/09/12_________ 1013 _____________  __________ Signature of Patient or Authorized Representative            Date                   Time    __________________________________________ Nurse's Signature

## 2011-11-09 NOTE — Progress Notes (Signed)
Bone Marrow Biopsy and Aspiration Procedure Note   Informed consent was obtained and potential risks including bleeding, infection and pain were reviewed with the patient.   Right Superior Posterior iliac crest prepped with Betadine.   Lidocaine 2% local anesthesia infiltrated into the subcutaneous tissue.  Right bone marrow biopsy and right bone marrow aspirate was obtained.   The procedure was tolerated well and there were no complications.  Specimens sent for: routine histopathologic stains and sectioning, flow cytometry and cytogenetics.  Physician: Lajuana Matte.

## 2011-11-09 NOTE — Progress Notes (Signed)
[facility] BONE MARROW BIOPSY/ASPIRATE PROGRESS NOTE  Eddie [name] presents for Bone Marrow biopsy per MD orders. Eddie [name] verbalized understanding of procedure. Consent reviewed and signed.  Eddie [name] positioned supine for procedure. Time-out performed and Bone Marrow Checklist. Procedure began at 1000. Xylocaine 2% 10 cc used for local and administered to patient by Dr. Arbutus Ped. Procedure completed at 1015. Patient tolerated well. Pressure dressing applied to the right hip with instructions to leave in place for 24 hours. Patient instructed to report any bleeding that saturates dressing and to take pain medication tylenol or ibuprohen as directed. Dressing dry and intact to the right hip on discharge. VSS pre and post procedure. Patient discharged at 1040. Dressing dry and intact.

## 2011-11-13 ENCOUNTER — Ambulatory Visit (HOSPITAL_BASED_OUTPATIENT_CLINIC_OR_DEPARTMENT_OTHER): Payer: Self-pay | Admitting: Pharmacist

## 2011-11-13 ENCOUNTER — Ambulatory Visit: Payer: Medicare Other

## 2011-11-13 ENCOUNTER — Other Ambulatory Visit: Payer: Self-pay | Admitting: Internal Medicine

## 2011-11-13 ENCOUNTER — Other Ambulatory Visit (HOSPITAL_BASED_OUTPATIENT_CLINIC_OR_DEPARTMENT_OTHER): Payer: Medicare Other

## 2011-11-13 DIAGNOSIS — K55059 Acute (reversible) ischemia of intestine, part and extent unspecified: Secondary | ICD-10-CM

## 2011-11-13 DIAGNOSIS — D696 Thrombocytopenia, unspecified: Secondary | ICD-10-CM

## 2011-11-13 DIAGNOSIS — K55069 Acute infarction of intestine, part and extent unspecified: Secondary | ICD-10-CM

## 2011-11-13 LAB — PROTIME-INR
INR: 1.7 — ABNORMAL LOW (ref 2.00–3.50)
Protime: 20.4 Seconds — ABNORMAL HIGH (ref 10.6–13.4)

## 2011-11-13 LAB — POCT INR: INR: 1.7

## 2011-11-13 NOTE — Progress Notes (Signed)
Pt eats Argo starch out of the box-- 5 teaspoons daily.  He has done this for past 8 mos - 1 year.

## 2011-11-13 NOTE — Patient Instructions (Signed)
Stop Lovenox shots. Get in touch w/ Dr. Lamar Sprinkles office for follow-up appt since stool is dark black.

## 2011-11-16 ENCOUNTER — Telehealth: Payer: Self-pay | Admitting: Internal Medicine

## 2011-11-16 LAB — CHROMOSOME ANALYSIS, BONE MARROW

## 2011-11-16 NOTE — Telephone Encounter (Signed)
Talked to pt , he r/s appt to 12/24, he is aware of appt on 11/20/11

## 2011-11-19 ENCOUNTER — Other Ambulatory Visit: Payer: Self-pay | Admitting: Internal Medicine

## 2011-11-20 ENCOUNTER — Other Ambulatory Visit: Payer: Self-pay | Admitting: Internal Medicine

## 2011-11-20 ENCOUNTER — Other Ambulatory Visit (HOSPITAL_BASED_OUTPATIENT_CLINIC_OR_DEPARTMENT_OTHER): Payer: Medicare Other | Admitting: Lab

## 2011-11-20 ENCOUNTER — Ambulatory Visit: Payer: Medicare Other

## 2011-11-20 DIAGNOSIS — D696 Thrombocytopenia, unspecified: Secondary | ICD-10-CM

## 2011-11-20 DIAGNOSIS — C090 Malignant neoplasm of tonsillar fossa: Secondary | ICD-10-CM

## 2011-11-20 LAB — CBC WITH DIFFERENTIAL/PLATELET
Basophils Absolute: 0 10*3/uL (ref 0.0–0.1)
HCT: 26.9 % — ABNORMAL LOW (ref 38.4–49.9)
HGB: 8.6 g/dL — ABNORMAL LOW (ref 13.0–17.1)
LYMPH%: 23 % (ref 14.0–49.0)
MCH: 29.4 pg (ref 27.2–33.4)
MCHC: 32 g/dL (ref 32.0–36.0)
MONO#: 0.2 10*3/uL (ref 0.1–0.9)
NEUT%: 61.9 % (ref 39.0–75.0)
Platelets: 68 10*3/uL — ABNORMAL LOW (ref 140–400)
WBC: 2.5 10*3/uL — ABNORMAL LOW (ref 4.0–10.3)
lymph#: 0.6 10*3/uL — ABNORMAL LOW (ref 0.9–3.3)

## 2011-11-20 LAB — LACTATE DEHYDROGENASE: LDH: 296 U/L — ABNORMAL HIGH (ref 94–250)

## 2011-11-20 LAB — COMPREHENSIVE METABOLIC PANEL
Albumin: 3.3 g/dL — ABNORMAL LOW (ref 3.5–5.2)
BUN: 14 mg/dL (ref 6–23)
Calcium: 9.1 mg/dL (ref 8.4–10.5)
Chloride: 98 mEq/L (ref 96–112)
Creatinine, Ser: 1.14 mg/dL (ref 0.50–1.35)
Glucose, Bld: 365 mg/dL — ABNORMAL HIGH (ref 70–99)
Potassium: 4.8 mEq/L (ref 3.5–5.3)

## 2011-11-20 LAB — PROTIME-INR

## 2011-11-20 NOTE — Progress Notes (Unsigned)
Pt stated that he took coumadin as instructed on sheet that was given to him at last visit.  He could not recall what dose he is taking.

## 2011-11-21 ENCOUNTER — Other Ambulatory Visit: Payer: Self-pay | Admitting: Internal Medicine

## 2011-11-21 DIAGNOSIS — K55069 Acute infarction of intestine, part and extent unspecified: Secondary | ICD-10-CM

## 2011-11-28 ENCOUNTER — Other Ambulatory Visit: Payer: Medicare Other | Admitting: Lab

## 2011-11-28 ENCOUNTER — Ambulatory Visit: Payer: Medicare Other | Admitting: Internal Medicine

## 2011-12-03 ENCOUNTER — Ambulatory Visit: Payer: Medicare Other

## 2011-12-03 ENCOUNTER — Other Ambulatory Visit (HOSPITAL_BASED_OUTPATIENT_CLINIC_OR_DEPARTMENT_OTHER): Payer: Medicare Other | Admitting: Lab

## 2011-12-03 DIAGNOSIS — K55069 Acute infarction of intestine, part and extent unspecified: Secondary | ICD-10-CM

## 2011-12-03 DIAGNOSIS — K55059 Acute (reversible) ischemia of intestine, part and extent unspecified: Secondary | ICD-10-CM

## 2011-12-03 LAB — PROTIME-INR
INR: 2.6 (ref 2.00–3.50)
Protime: 31.2 Seconds — ABNORMAL HIGH (ref 10.6–13.4)

## 2011-12-03 NOTE — Progress Notes (Unsigned)
No complaints.  No changes in meds or diet. INR therapeutic. Continue current dose.  Recheck INR in 3 weeks.

## 2011-12-17 ENCOUNTER — Telehealth: Payer: Self-pay | Admitting: Pharmacist

## 2011-12-17 NOTE — Telephone Encounter (Signed)
Pt called and stated that he would run out of his Coumadin before his 12/24/11 visit. He would like to pick up additional tablets. (Coumadin 5mg  tablets, #30. ZOX:[id number], Exp 05/2013) He will pick up additional tablets on 12/19/11 @ 11am. He isn't able to pick them up any sooner. He has 2 tablets (5mg  tablets) remaining. He takes 5mg  daily except 7.5mg  on Tu&Thu. On 12/18/11, he will take 5mg  instead of 7.5mg  due to running short on his Coumadin tablets. He plans to keep his apt on 12/24/11

## 2011-12-18 ENCOUNTER — Other Ambulatory Visit: Payer: Self-pay | Admitting: Otolaryngology

## 2011-12-18 DIAGNOSIS — D490 Neoplasm of unspecified behavior of digestive system: Secondary | ICD-10-CM

## 2011-12-21 ENCOUNTER — Ambulatory Visit
Admission: RE | Admit: 2011-12-21 | Discharge: 2011-12-21 | Disposition: A | Discharge status: Home / Self Care | Payer: Medicare Other | Source: Ambulatory Visit | Attending: Otolaryngology | Admitting: Otolaryngology

## 2011-12-21 DIAGNOSIS — D490 Neoplasm of unspecified behavior of digestive system: Secondary | ICD-10-CM

## 2011-12-21 MED ORDER — IOHEXOL 300 MG/ML  SOLN
75.0000 mL | Freq: Once | INTRAMUSCULAR | Status: AC | PRN
  Administered 2011-12-21: 75 mL via INTRAVENOUS

## 2011-12-24 ENCOUNTER — Ambulatory Visit: Payer: Medicare Other

## 2011-12-24 ENCOUNTER — Other Ambulatory Visit: Payer: Medicare Other

## 2011-12-24 ENCOUNTER — Other Ambulatory Visit: Payer: Self-pay | Admitting: Internal Medicine

## 2011-12-24 ENCOUNTER — Ambulatory Visit: Payer: Self-pay | Admitting: Internal Medicine

## 2011-12-24 ENCOUNTER — Other Ambulatory Visit (HOSPITAL_BASED_OUTPATIENT_CLINIC_OR_DEPARTMENT_OTHER): Payer: Medicare Other | Admitting: Lab

## 2011-12-24 ENCOUNTER — Ambulatory Visit (HOSPITAL_BASED_OUTPATIENT_CLINIC_OR_DEPARTMENT_OTHER): Payer: Medicare Other | Admitting: Internal Medicine

## 2011-12-24 DIAGNOSIS — K55059 Acute (reversible) ischemia of intestine, part and extent unspecified: Secondary | ICD-10-CM

## 2011-12-24 DIAGNOSIS — Z8546 Personal history of malignant neoplasm of prostate: Secondary | ICD-10-CM

## 2011-12-24 DIAGNOSIS — K55069 Acute infarction of intestine, part and extent unspecified: Secondary | ICD-10-CM

## 2011-12-24 DIAGNOSIS — D61818 Other pancytopenia: Secondary | ICD-10-CM

## 2011-12-24 DIAGNOSIS — D595 Paroxysmal nocturnal hemoglobinuria [Marchiafava-Micheli]: Secondary | ICD-10-CM

## 2011-12-24 DIAGNOSIS — B1710 Acute hepatitis C without hepatic coma: Secondary | ICD-10-CM

## 2011-12-24 DIAGNOSIS — Z85038 Personal history of other malignant neoplasm of large intestine: Secondary | ICD-10-CM

## 2011-12-24 DIAGNOSIS — C184 Malignant neoplasm of transverse colon: Secondary | ICD-10-CM

## 2011-12-24 DIAGNOSIS — I749 Embolism and thrombosis of unspecified artery: Secondary | ICD-10-CM

## 2011-12-24 DIAGNOSIS — C090 Malignant neoplasm of tonsillar fossa: Secondary | ICD-10-CM

## 2011-12-24 DIAGNOSIS — C61 Malignant neoplasm of prostate: Secondary | ICD-10-CM

## 2011-12-24 DIAGNOSIS — Z8589 Personal history of malignant neoplasm of other organs and systems: Secondary | ICD-10-CM

## 2011-12-24 LAB — CBC WITH DIFFERENTIAL/PLATELET
Basophils Absolute: 0 10*3/uL (ref 0.0–0.1)
EOS%: 5.6 % (ref 0.0–7.0)
Eosinophils Absolute: 0.1 10*3/uL (ref 0.0–0.5)
HCT: 28.6 % — ABNORMAL LOW (ref 38.4–49.9)
HGB: 9 g/dL — ABNORMAL LOW (ref 13.0–17.1)
MCH: 26.9 pg — ABNORMAL LOW (ref 27.2–33.4)
MCV: 85.4 fL (ref 79.3–98.0)
MONO%: 17.9 % — ABNORMAL HIGH (ref 0.0–14.0)
NEUT#: 1.2 10*3/uL — ABNORMAL LOW (ref 1.5–6.5)
NEUT%: 52.2 % (ref 39.0–75.0)
Platelets: 66 10*3/uL — ABNORMAL LOW (ref 140–400)
RDW: 15.5 % — ABNORMAL HIGH (ref 11.0–14.6)

## 2011-12-24 LAB — COMPREHENSIVE METABOLIC PANEL
Albumin: 3.8 g/dL (ref 3.5–5.2)
Alkaline Phosphatase: 121 U/L — ABNORMAL HIGH (ref 39–117)
BUN: 12 mg/dL (ref 6–23)
CO2: 21 mEq/L (ref 19–32)
Glucose, Bld: 358 mg/dL — ABNORMAL HIGH (ref 70–99)
Sodium: 134 mEq/L — ABNORMAL LOW (ref 135–145)
Total Bilirubin: 0.7 mg/dL (ref 0.3–1.2)
Total Protein: 7 g/dL (ref 6.0–8.3)

## 2011-12-24 NOTE — Progress Notes (Signed)
[facility] OFFICE PROGRESS NOTE  Eddie Harder, MD, MD [address] [place] [address]  DIAGNOSIS:  :   1. 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 2. 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 3. History of colon adenocarcinoma diagnosed in September of 2010 at [facility]. 4. History of hepatitis and liver cirrhosis. 5. History of gastrointestinal bleed. 6. Pancytopenia. 7. Mesenteric vein thrombosis.  PRIOR THERAPY:  1. Status post concurrent chemoradiation with weekly cisplatin for tonsillar squamous cell carcinoma, last dose was given March 22, 2008.   2. Status post curative radiotherapy with IMRT and androgen ablation for the prostate adenocarcinoma, completed December 10, 2008. 3. Status post left hemicolectomy at [facility] in November of 2010 revealing T3 N0 M0 colon adenocarcinoma and the patient did not receive adjuvant chemotherapy.  CURRENT THERAPY: Coumadin 5 mg by mouth as directed. 5mg  daily except 7.5mg  on Tu &Thu    INTERVAL HISTORY: Eddie [name] 70 y.o. male returns to the clinic today for followup visit. The patient is feeling fine today he denied having any specific complaints. He is tolerating his treatment with Coumadin well. He has no bleeding issues, no bruises or ecchymosis. He has a recent CT scan of the neck performaned and he is here today for evaluation and discussion of his scan results. The patient underwent a bone marrow biopsy and aspirate on 11/09/2011 which showed no significant abnormality to explain his pancytopenia.  MEDICAL HISTORY: Past Medical History  Diagnosis Date  . Cancer     prostate ca  . Cancer of colon   . Cancer     tonsillar  . Upper GI bleeding     EGD on 01/2011 shows esophagitis, grade 1 varices  . Hypertension   . Cirrhosis   . Anemia   . Diabetes mellitus   . Hepatitis C   .  Gallstone   . Thrush, oral   . GERD (gastroesophageal reflux disease)   . DM gastroparesis   . Gastritis   . Hernia   . Pancreatitis     ALLERGIES:   has no known allergies.  MEDICATIONS:  Current Outpatient Prescriptions  Medication Sig Dispense Refill  . ACCU-CHEK FASTCLIX LANCETS MISC 1 each by Does not apply route 2 (two) times daily before lunch and supper.  100 each  11  . aspirin 81 MG EC tablet Take 81 mg by mouth daily.        . Blood Glucose Monitoring Suppl (ACCU-CHEK NANO SMARTVIEW) W/DEVICE KIT 1 each by Does not apply route 2 (two) times daily.  1 kit  0  . carvedilol (COREG) 3.125 MG tablet Take 1 tablet (3.125 mg total) by mouth 2 (two) times daily with a meal.  60 tablet  11  . folic acid (FOLVITE) 1 MG tablet Take 1 tablet (1 mg total) by mouth daily.  100 tablet  3  . glucose blood (ACCU-CHEK SMARTVIEW) test strip Use as instructed  100 each  11  . insulin glargine (LANTUS) 100 UNIT/ML injection Inject 25 units into the skin subcutaneously at night.  10 mL  11  . Insulin Syringe-Needle U-100 (ELITE-THIN INS SYR .5CC/31G) 31G X 5/16" 0.5 ML MISC Use as directed       . Multiple Vitamin (MULTIVITAMIN) tablet Take 1 tablet by mouth daily.  30 tablet  3  . omeprazole (PRILOSEC) 40 MG capsule Take 1 capsule (  40 mg total) by mouth daily.  30 capsule  11  . warfarin (COUMADIN) 5 MG tablet Take 5 mg by mouth as directed. 5mg  daily except 7.5mg  on Tu &Thu      . neomycin-bacitracin-polymyxin (NEOSPORIN) ointment Apply topically every 12 (twelve) hours. apply to the affected area as directed.  15 g  0    SURGICAL HISTORY:  Past Surgical History  Procedure Date  . Colon surgery   . Hernia repair     REVIEW OF SYSTEMS:  A comprehensive review of systems was negative.   PHYSICAL EXAMINATION: General appearance: alert, cooperative and no distress Head: Normocephalic, without obvious abnormality, atraumatic Neck: no adenopathy Lymph nodes: Cervical, supraclavicular, and  axillary nodes normal. Resp: clear to auscultation bilaterally Cardio: regular rate and rhythm, S1, S2 normal, no murmur, click, rub or gallop GI: soft, non-tender; bowel sounds normal; no masses,  no organomegaly Extremities: extremities normal, atraumatic, no cyanosis or edema Neurologic: Alert and oriented X 3, normal strength and tone. Normal symmetric reflexes. Normal coordination and gait  ECOG PERFORMANCE STATUS: 0 - Asymptomatic  Blood pressure 140/81, pulse 82, temperature 98 F (36.7 C), temperature source Oral, height 6' (1.829 m), weight 183 lb 12.8 oz (83.371 kg).  LABORATORY DATA: Lab Results  Component Value Date   WBC 2.5* 11/20/2011   HGB 8.6* 11/20/2011   HCT 26.9* 11/20/2011   MCV 91.8 11/20/2011   PLT 68* 11/20/2011      Chemistry      Component Value Date/Time   NA 130* 11/20/2011 0932   NA 142 09/17/2011 1030   K 4.8 11/20/2011 0932   K 4.2 09/17/2011 1030   CL 98 11/20/2011 0932   CL 106 09/17/2011 1030   CO2 24 11/20/2011 0932   CO2 26 09/17/2011 1030   BUN 14 11/20/2011 0932   BUN 12 09/17/2011 1030   CREATININE 1.14 11/20/2011 0932   CREATININE 0.9 09/17/2011 1030      Component Value Date/Time   CALCIUM 9.1 11/20/2011 0932   CALCIUM 9.5 09/17/2011 1030   ALKPHOS 124* 11/20/2011 0932   ALKPHOS 105* 09/17/2011 1030   AST 51* 11/20/2011 0932   AST 66* 09/17/2011 1030   ALT 44 11/20/2011 0932   BILITOT 0.6 11/20/2011 0932   BILITOT 1.10 09/17/2011 1030       RADIOGRAPHIC STUDIES: Ct Soft Tissue Neck W Contrast  12/21/2011  *RADIOLOGY REPORT*  Clinical Data: History of cancer of the tonsillar fossa.  Chemo and radiation completed.  Colon cancer and prostate cancer.  Recent biopsy of the left oral pharyngeal wall showing no neoplasm.  CT NECK WITH CONTRAST  Technique:  Multidetector CT imaging of the neck was performed with intravenous contrast.  Contrast: 75mL OMNIPAQUE IOHEXOL 300 MG/ML IV SOLN  Comparison: CT neck 09/17/2011  Findings: Nodular  mucosal hypodensity left posterior oral pharyngeal wall has been removed and biopsy was negative for malignancy.  No residual mass is seen in this area.  No recurrent tonsillar mass is identified.  Negative for cervical adenopathy.  No evidence of recurrent tumor.  Lung apices are clear.  Submandibular and parotid glands are negative.  Extensive carotid atherosclerotic disease is present. Cervical disc degeneration and spondylosis is present in the cervical spine of a moderate to advanced degree.  No acute bony lesion.  IMPRESSION: Negative for recurrent tumor or cervical adenopathy.  Original Report Authenticated By: Camelia Phenes, M.D.    ASSESSMENT: This is a very pleasant 70 years old [place] American male with  multiple medical problems including histo for stage II tonsillar carcinoma, history of prostate cancer, history of colon cancer as well as history of hepatitis and liver cirrhosis in addition to mesenteric vein thrombosis. The patient has no evidence for disease progression in the neck based on recent scan. He is rating his treatment was Coumadin fairly well. The recent bone marrow biopsy and aspirate showed no significant abnormalities to explain his pancytopenia and this most likely secondary to liver cirrhosis and hepatitis. I discussed the scan and lab results with the patient  PLAN: I recommended for him continuous observation for now. I will check flow cytometry for CD55 and CD59 to rule out PNH. I would see the patient back for followup visit in 2 months with repeat CBC and comprehensive metabolic panel.  All questions were answered. The patient knows to call the clinic with any problems, questions or concerns. We can certainly see the patient much sooner if necessary.

## 2011-12-28 LAB — OTHER SOLSTAS TEST

## 2012-01-24 ENCOUNTER — Ambulatory Visit: Payer: Medicare Other

## 2012-01-24 ENCOUNTER — Other Ambulatory Visit (HOSPITAL_BASED_OUTPATIENT_CLINIC_OR_DEPARTMENT_OTHER): Payer: Medicare Other | Admitting: Lab

## 2012-01-24 DIAGNOSIS — K55069 Acute infarction of intestine, part and extent unspecified: Secondary | ICD-10-CM

## 2012-01-24 DIAGNOSIS — K55059 Acute (reversible) ischemia of intestine, part and extent unspecified: Secondary | ICD-10-CM

## 2012-01-24 NOTE — Progress Notes (Unsigned)
No complaints today.  Doing well.  INR therapeutic.  Continue 5mg  daily except 7.5mg  on TuTh.    Provided more Coumadin samples to the patient:  Coumadin 5mg  #30 Lot: [id number] Exp: 6/14  Will recheck INR in 4 weeks.

## 2012-01-31 ENCOUNTER — Other Ambulatory Visit: Payer: Self-pay | Admitting: Internal Medicine

## 2012-01-31 NOTE — Telephone Encounter (Signed)
Pls ask pt to make appt with PCP. Last seen in Aug 2012. Routine F/U.

## 2012-02-14 ENCOUNTER — Other Ambulatory Visit: Payer: Self-pay

## 2012-02-14 ENCOUNTER — Other Ambulatory Visit: Payer: Self-pay | Admitting: Internal Medicine

## 2012-02-14 ENCOUNTER — Encounter (HOSPITAL_COMMUNITY): Payer: Self-pay | Admitting: *Deleted

## 2012-02-14 ENCOUNTER — Inpatient Hospital Stay (HOSPITAL_COMMUNITY)
Admission: EM | Admit: 2012-02-14 | Discharge: 2012-02-17 | DRG: 377 | Disposition: A | Discharge status: Home / Self Care | Payer: Medicare Other | Attending: Internal Medicine | Admitting: Internal Medicine

## 2012-02-14 ENCOUNTER — Encounter: Payer: Self-pay | Admitting: Internal Medicine

## 2012-02-14 ENCOUNTER — Ambulatory Visit (INDEPENDENT_AMBULATORY_CARE_PROVIDER_SITE_OTHER): Payer: Medicare Other | Admitting: Internal Medicine

## 2012-02-14 VITALS — BP 115/65 | HR 86 | Temp 97.2°F | Ht 72.0 in | Wt 184.6 lb

## 2012-02-14 DIAGNOSIS — F1010 Alcohol abuse, uncomplicated: Secondary | ICD-10-CM

## 2012-02-14 DIAGNOSIS — D61818 Other pancytopenia: Secondary | ICD-10-CM | POA: Diagnosis present

## 2012-02-14 DIAGNOSIS — K55059 Acute (reversible) ischemia of intestine, part and extent unspecified: Secondary | ICD-10-CM | POA: Diagnosis present

## 2012-02-14 DIAGNOSIS — I8510 Secondary esophageal varices without bleeding: Secondary | ICD-10-CM | POA: Diagnosis present

## 2012-02-14 DIAGNOSIS — Z794 Long term (current) use of insulin: Secondary | ICD-10-CM

## 2012-02-14 DIAGNOSIS — E1165 Type 2 diabetes mellitus with hyperglycemia: Secondary | ICD-10-CM | POA: Diagnosis present

## 2012-02-14 DIAGNOSIS — Z85038 Personal history of other malignant neoplasm of large intestine: Secondary | ICD-10-CM

## 2012-02-14 DIAGNOSIS — K766 Portal hypertension: Secondary | ICD-10-CM | POA: Diagnosis present

## 2012-02-14 DIAGNOSIS — B3781 Candidal esophagitis: Secondary | ICD-10-CM | POA: Diagnosis present

## 2012-02-14 DIAGNOSIS — K254 Chronic or unspecified gastric ulcer with hemorrhage: Principal | ICD-10-CM | POA: Diagnosis present

## 2012-02-14 DIAGNOSIS — D62 Acute posthemorrhagic anemia: Secondary | ICD-10-CM

## 2012-02-14 DIAGNOSIS — E1121 Type 2 diabetes mellitus with diabetic nephropathy: Secondary | ICD-10-CM | POA: Diagnosis present

## 2012-02-14 DIAGNOSIS — K922 Gastrointestinal hemorrhage, unspecified: Secondary | ICD-10-CM

## 2012-02-14 DIAGNOSIS — IMO0002 Reserved for concepts with insufficient information to code with codable children: Secondary | ICD-10-CM | POA: Diagnosis present

## 2012-02-14 DIAGNOSIS — E119 Type 2 diabetes mellitus without complications: Secondary | ICD-10-CM

## 2012-02-14 DIAGNOSIS — D649 Anemia, unspecified: Secondary | ICD-10-CM

## 2012-02-14 DIAGNOSIS — R739 Hyperglycemia, unspecified: Secondary | ICD-10-CM

## 2012-02-14 DIAGNOSIS — Z7901 Long term (current) use of anticoagulants: Secondary | ICD-10-CM

## 2012-02-14 DIAGNOSIS — R188 Other ascites: Secondary | ICD-10-CM | POA: Diagnosis present

## 2012-02-14 DIAGNOSIS — D500 Iron deficiency anemia secondary to blood loss (chronic): Secondary | ICD-10-CM

## 2012-02-14 DIAGNOSIS — Z7982 Long term (current) use of aspirin: Secondary | ICD-10-CM

## 2012-02-14 DIAGNOSIS — M7989 Other specified soft tissue disorders: Secondary | ICD-10-CM

## 2012-02-14 DIAGNOSIS — I10 Essential (primary) hypertension: Secondary | ICD-10-CM | POA: Diagnosis present

## 2012-02-14 DIAGNOSIS — Z8546 Personal history of malignant neoplasm of prostate: Secondary | ICD-10-CM

## 2012-02-14 DIAGNOSIS — I8500 Esophageal varices without bleeding: Secondary | ICD-10-CM | POA: Diagnosis present

## 2012-02-14 DIAGNOSIS — K7460 Unspecified cirrhosis of liver: Secondary | ICD-10-CM | POA: Diagnosis present

## 2012-02-14 DIAGNOSIS — D509 Iron deficiency anemia, unspecified: Secondary | ICD-10-CM | POA: Diagnosis present

## 2012-02-14 DIAGNOSIS — E875 Hyperkalemia: Secondary | ICD-10-CM | POA: Diagnosis present

## 2012-02-14 DIAGNOSIS — I81 Portal vein thrombosis: Secondary | ICD-10-CM

## 2012-02-14 DIAGNOSIS — K319 Disease of stomach and duodenum, unspecified: Secondary | ICD-10-CM | POA: Diagnosis present

## 2012-02-14 LAB — CBC WITH DIFFERENTIAL/PLATELET
Eosinophils Absolute: 0.1 10*3/uL (ref 0.0–0.7)
Eosinophils Relative: 5 % (ref 0–5)
Hemoglobin: 6 g/dL — CL (ref 13.0–17.0)
Lymphs Abs: 0.5 10*3/uL — ABNORMAL LOW (ref 0.7–4.0)
MCH: 22.2 pg — ABNORMAL LOW (ref 26.0–34.0)
MCV: 78.9 fL (ref 78.0–100.0)
Monocytes Absolute: 0.2 10*3/uL (ref 0.1–1.0)
Monocytes Relative: 8 % (ref 3–12)
RBC: 2.7 MIL/uL — ABNORMAL LOW (ref 4.22–5.81)

## 2012-02-14 LAB — LIPID PANEL
HDL: 47 mg/dL (ref >39)
LDL Cholesterol: 70 mg/dL (ref 0–99)
Triglycerides: 116 mg/dL (ref <150)
VLDL: 23 mg/dL (ref 0–40)

## 2012-02-14 LAB — HEMOGLOBIN A1C: Mean Plasma Glucose: 263 mg/dL — ABNORMAL HIGH (ref <117)

## 2012-02-14 MED ORDER — LISINOPRIL 5 MG PO TABS: 5.0000 mg | ORAL_TABLET | Freq: Every day | ORAL | Status: DC

## 2012-02-14 NOTE — Progress Notes (Signed)
  Subjective:    Patient ID: YIFAN AUKER, male    DOB: November 12, 1941, 71 y.o.   MRN: [id number]  HPI: 71 year old man with past medical history significant for colon cancer status post hemicolectomy, tonsillar cancer status post chemoradiation in June 2009, prostrate cancer status post radiation, diabetes comes to the clinic for a followup visit.  1) Diabetes: He has not been checking his blood sugars because apparently he did not have strips. He takes 25 units of Lantus every night. Denies any hypoglycemic events  2) Leg swelling; He has noticed some leg swelling for last one week that is getting progressively worse. He also reports some dyspnea on exertion with minimal activity, could barely walk a block or 2 without getting short of breath. She also reports occasional episodic chest pain for last 1 month. He described it as burning , located all over her chest. Also endorses some fatigue. Denies any palpitations, wheezing,fever, chills nausea or vomiting.  3) Cirrhosis and Mesentric vein thrombosis: he is on anticoagulation and follows up with Dr. Arbutus Ped for his PT/INR check.  He also follows up with Dr. Arbutus Ped, given his history of multiple cancers.    Review of Systems  Constitutional: Negative for fever and fatigue.  Respiratory: Positive for chest tightness and shortness of breath.   Cardiovascular: Positive for leg swelling. Negative for chest pain and palpitations.  Genitourinary: Negative for dysuria.  Neurological: Negative for dizziness, facial asymmetry, light-headedness and headaches.       Objective:   Physical Exam  Constitutional: He is oriented to person, place, and time. He appears well-developed and well-nourished. No distress.  HENT:  Head: Normocephalic and atraumatic.  Mouth/Throat: No oropharyngeal exudate.  Eyes: Pupils are equal, round, and reactive to light.  Neck: Normal range of motion. Neck supple. No JVD present. No tracheal deviation present. No  thyromegaly present.  Cardiovascular: Normal rate, regular rhythm, normal heart sounds and intact distal pulses.  Exam reveals no gallop and no friction rub.   No murmur heard. Pulmonary/Chest: Effort normal and breath sounds normal. No stridor. No respiratory distress. He has no wheezes. He has no rales.  Abdominal: Soft. Bowel sounds are normal. He exhibits no distension. There is no tenderness. There is no rebound.  Musculoskeletal: Normal range of motion. He exhibits no edema and no tenderness.  Lymphadenopathy:    He has no cervical adenopathy.  Neurological: He is alert and oriented to person, place, and time. He has normal reflexes. He displays normal reflexes. No cranial nerve deficit. Coordination normal.  Skin: Skin is warm. He is not diaphoretic.          Assessment & Plan:

## 2012-02-14 NOTE — Assessment & Plan Note (Signed)
Lab Results  Component Value Date   HGBA1C 11.7 08/07/2011   HGBA1C  Value: 7.3 (NOTE)                                                                       According to the ADA Clinical Practice Recommendations for 2011, when HbA1c is used as a screening test:   >=6.5%   Diagnostic of Diabetes Mellitus           (if abnormal result  is confirmed)  5.7-6.4%   Increased risk of developing Diabetes Mellitus  References:Diagnosis and Classification of Diabetes Mellitus,Diabetes Care,2011,34(Suppl 1):S62-S69 and Standards of Medical Care in         Diabetes - 2011,Diabetes Care,2011,34  (Suppl 1):S11-S61.* 02/06/2011   CREATININE 1.29 12/24/2011   CREATININE 0.9 09/17/2011   MICROALBUR 12.21* 08/21/2011   MICRALBCREAT 154.0* 08/21/2011   CHOL  Value: 109        ATP III CLASSIFICATION:  <200     mg/dL   Desirable  161-096  mg/dL   Borderline High  >=045    mg/dL   High        4/0/9811   HDL 38* 02/06/2011   TRIG 95 02/06/2011    Last eye exam and foot exam: Foot exam done today.    Component Value Date/Time   HMDIABFOOTEX done 10/06/2010    Assessment: He did not get his meter today and reports that he has not been checking his blood sugars at all. He states that he is running out of the strips but in our records, he appears to have 11 refills on his strips. We could not calculate his AIC in the lab today (etiology unclear  ? low hemoglobin). Therefore I could not make any adjustments in his regimen. Diabetes control: not controlled Progress toward goals: unable to assess Barriers to meeting goals: lack of understanding of disease management  Plan: Diabetes treatment: continue current medications.                                  -Followup in 2 weeks with meter, so that further adjustments in insulin regimen could be made.                                  -Start him on a low dose ACE inhibitor due to renal protective effects given his elevated microalbuminuria. Refer to: none Instruction/counseling  given: reminded to bring blood glucose meter & log to each visit, reminded to bring medications to each visit, discussed foot care, discussed the need for weight loss and discussed diet

## 2012-02-14 NOTE — Patient Instructions (Signed)
Please schedule a follow up appointment in 2 weeks . Please bring your medication bottles with your next appointment. Please bring your meter with your next appointment. Please take your medicines as prescribed. I will call you with your lab results if anything will be abnormal.

## 2012-02-14 NOTE — Progress Notes (Signed)
Patient ID: Eddie [name], male   DOB: 06-Sep-1941, 71 y.o.   MRN: [id number] I received a call from [facility] labs stating that Pts hemoglobin is critically low at 6, prior hemoglobin was 9 a month ago. I called the patient and advised him to go to the ED now for eval. Pt voices agreement.   Darnelle Maffucci, MD

## 2012-02-14 NOTE — ED Notes (Signed)
The pt was seen at his doctors office earlier today and had lab drawn.  He was called back tonight and was told his hgb was 6.0.  Alert no distress

## 2012-02-14 NOTE — Progress Notes (Signed)
Pt aware of appt [facility] 02/21/12 2PM 2D Echo. Stanton Kidney Levante Simones RN 02/14/12 12N.

## 2012-02-15 ENCOUNTER — Encounter (HOSPITAL_COMMUNITY)
Admission: EM | Disposition: A | Discharge status: Home / Self Care | Payer: Self-pay | Source: Home / Self Care | Attending: Internal Medicine

## 2012-02-15 ENCOUNTER — Encounter (HOSPITAL_COMMUNITY): Payer: Self-pay | Admitting: Internal Medicine

## 2012-02-15 DIAGNOSIS — K7460 Unspecified cirrhosis of liver: Secondary | ICD-10-CM

## 2012-02-15 DIAGNOSIS — K922 Gastrointestinal hemorrhage, unspecified: Secondary | ICD-10-CM

## 2012-02-15 DIAGNOSIS — I8500 Esophageal varices without bleeding: Secondary | ICD-10-CM

## 2012-02-15 DIAGNOSIS — I81 Portal vein thrombosis: Secondary | ICD-10-CM

## 2012-02-15 DIAGNOSIS — D62 Acute posthemorrhagic anemia: Secondary | ICD-10-CM

## 2012-02-15 DIAGNOSIS — D649 Anemia, unspecified: Secondary | ICD-10-CM

## 2012-02-15 DIAGNOSIS — I8510 Secondary esophageal varices without bleeding: Secondary | ICD-10-CM | POA: Diagnosis present

## 2012-02-15 DIAGNOSIS — D500 Iron deficiency anemia secondary to blood loss (chronic): Secondary | ICD-10-CM | POA: Diagnosis present

## 2012-02-15 DIAGNOSIS — K319 Disease of stomach and duodenum, unspecified: Secondary | ICD-10-CM

## 2012-02-15 HISTORY — PX: ESOPHAGOGASTRODUODENOSCOPY: SHX5428

## 2012-02-15 LAB — SAMPLE TO BLOOD BANK

## 2012-02-15 LAB — COMPREHENSIVE METABOLIC PANEL
AST: 99 U/L — ABNORMAL HIGH (ref 0–37)
Albumin: 3.5 g/dL (ref 3.5–5.2)
BUN: 16 mg/dL (ref 6–23)
Creatinine, Ser: 0.98 mg/dL (ref 0.50–1.35)
Total Protein: 7.5 g/dL (ref 6.0–8.3)

## 2012-02-15 LAB — CBC
HCT: 25.1 % — ABNORMAL LOW (ref 39.0–52.0)
Hemoglobin: 6.4 g/dL — CL (ref 13.0–17.0)
Hemoglobin: 7.8 g/dL — ABNORMAL LOW (ref 13.0–17.0)
MCH: 22.5 pg — ABNORMAL LOW (ref 26.0–34.0)
MCHC: 29.2 g/dL — ABNORMAL LOW (ref 30.0–36.0)
MCHC: 31.1 g/dL (ref 30.0–36.0)
MCV: 77.5 fL — ABNORMAL LOW (ref 78.0–100.0)
RDW: 17.3 % — ABNORMAL HIGH (ref 11.5–15.5)
RDW: 16.5 % — ABNORMAL HIGH (ref 11.5–15.5)
WBC: 2 10*3/uL — ABNORMAL LOW (ref 4.0–10.5)

## 2012-02-15 LAB — GLUCOSE, CAPILLARY

## 2012-02-15 LAB — DIFFERENTIAL
Basophils Absolute: 0 10*3/uL (ref 0.0–0.1)
Basophils Relative: 0 % (ref 0–1)
Blasts: 0 %
Lymphocytes Relative: 35 % (ref 12–46)
Lymphs Abs: 0.9 10*3/uL (ref 0.7–4.0)
Myelocytes: 0 %
Neutro Abs: 1.6 10*3/uL — ABNORMAL LOW (ref 1.7–7.7)
Neutrophils Relative %: 60 % (ref 43–77)
Promyelocytes Absolute: 0 %

## 2012-02-15 LAB — PROTIME-INR
INR: 2.3 — ABNORMAL HIGH (ref 0.00–1.49)
Prothrombin Time: 25.7 seconds — ABNORMAL HIGH (ref 11.6–15.2)

## 2012-02-15 LAB — COMPLETE METABOLIC PANEL WITH GFR
ALT: 37 U/L (ref 0–53)
AST: 47 U/L — ABNORMAL HIGH (ref 0–37)
Chloride: 103 mEq/L (ref 96–112)
Creat: 1.16 mg/dL (ref 0.50–1.35)
Sodium: 135 mEq/L (ref 135–145)
Total Bilirubin: 0.7 mg/dL (ref 0.3–1.2)

## 2012-02-15 LAB — CARDIAC PANEL(CRET KIN+CKTOT+MB+TROPI)
CK, MB: 4.9 ng/mL — ABNORMAL HIGH (ref 0.3–4.0)
Troponin I: <0.3 ng/mL (ref <0.30)

## 2012-02-15 LAB — OCCULT BLOOD, POC DEVICE: Fecal Occult Bld: NEGATIVE

## 2012-02-15 LAB — PREPARE RBC (CROSSMATCH)

## 2012-02-15 LAB — APTT: aPTT: 34 seconds (ref 24–37)

## 2012-02-15 SURGERY — EGD (ESOPHAGOGASTRODUODENOSCOPY)
Anesthesia: Moderate Sedation

## 2012-02-15 MED ORDER — INSULIN ASPART 100 UNIT/ML ~~LOC~~ SOLN
0.0000 [IU] | Freq: Three times a day (TID) | SUBCUTANEOUS | Status: DC
  Administered 2012-02-15: 3 [IU] via SUBCUTANEOUS
  Administered 2012-02-16 (×2): 1 [IU] via SUBCUTANEOUS
  Administered 2012-02-16: 3 [IU] via SUBCUTANEOUS
  Administered 2012-02-17: 5 [IU] via SUBCUTANEOUS
  Administered 2012-02-17: 2 [IU] via SUBCUTANEOUS
  Filled 2012-02-15: qty 3

## 2012-02-15 MED ORDER — ACETAMINOPHEN 650 MG RE SUPP: 650.0000 mg | Freq: Four times a day (QID) | RECTAL | Status: DC | PRN

## 2012-02-15 MED ORDER — SODIUM CHLORIDE 0.9 % IV SOLN
250.0000 mL | INTRAVENOUS | Status: DC | PRN
  Administered 2012-02-15: 250 mL via INTRAVENOUS

## 2012-02-15 MED ORDER — BUTAMBEN-TETRACAINE-BENZOCAINE 2-2-14 % EX AERO
INHALATION_SPRAY | CUTANEOUS | Status: DC | PRN
  Administered 2012-02-15: 2 via TOPICAL

## 2012-02-15 MED ORDER — SODIUM CHLORIDE 0.9 % IJ SOLN
3.0000 mL | Freq: Two times a day (BID) | INTRAMUSCULAR | Status: DC
  Administered 2012-02-15 – 2012-02-16 (×3): 3 mL via INTRAVENOUS

## 2012-02-15 MED ORDER — INSULIN GLARGINE 100 UNIT/ML ~~LOC~~ SOLN
20.0000 [IU] | Freq: Every day | SUBCUTANEOUS | Status: DC
  Administered 2012-02-15 – 2012-02-16 (×2): 20 [IU] via SUBCUTANEOUS
  Filled 2012-02-15: qty 3

## 2012-02-15 MED ORDER — ZOLPIDEM TARTRATE 5 MG PO TABS
5.0000 mg | ORAL_TABLET | Freq: Every evening | ORAL | Status: DC | PRN
  Administered 2012-02-16: 5 mg via ORAL
  Filled 2012-02-15: qty 1

## 2012-02-15 MED ORDER — FENTANYL NICU IV SYRINGE 50 MCG/ML
INJECTION | INTRAMUSCULAR | Status: DC | PRN
  Administered 2012-02-15 (×2): 25 ug via INTRAVENOUS
  Administered 2012-02-15 (×2): 12.5 ug via INTRAVENOUS

## 2012-02-15 MED ORDER — MIDAZOLAM HCL 10 MG/2ML IJ SOLN
INTRAMUSCULAR | Status: DC | PRN
  Administered 2012-02-15: 1 mg via INTRAVENOUS
  Administered 2012-02-15: 2 mg via INTRAVENOUS
  Administered 2012-02-15: 1 mg via INTRAVENOUS
  Administered 2012-02-15: 2 mg via INTRAVENOUS
  Administered 2012-02-15: 1 mg via INTRAVENOUS

## 2012-02-15 MED ORDER — FOLIC ACID 1 MG PO TABS
1.0000 mg | ORAL_TABLET | Freq: Every day | ORAL | Status: DC
  Administered 2012-02-15 – 2012-02-17 (×3): 1 mg via ORAL
  Filled 2012-02-15 (×3): qty 1

## 2012-02-15 MED ORDER — DIPHENHYDRAMINE HCL 50 MG/ML IJ SOLN
INTRAMUSCULAR | Status: AC
  Filled 2012-02-15: qty 1

## 2012-02-15 MED ORDER — ACETAMINOPHEN 325 MG PO TABS: 650.0000 mg | ORAL_TABLET | Freq: Four times a day (QID) | ORAL | Status: DC | PRN

## 2012-02-15 MED ORDER — FENTANYL CITRATE 0.05 MG/ML IJ SOLN
INTRAMUSCULAR | Status: AC
  Filled 2012-02-15: qty 2

## 2012-02-15 MED ORDER — PANTOPRAZOLE SODIUM 40 MG PO TBEC
40.0000 mg | DELAYED_RELEASE_TABLET | Freq: Every day | ORAL | Status: DC
  Administered 2012-02-16 – 2012-02-17 (×2): 40 mg via ORAL
  Filled 2012-02-15 (×2): qty 1

## 2012-02-15 MED ORDER — CARVEDILOL 3.125 MG PO TABS
3.1250 mg | ORAL_TABLET | Freq: Two times a day (BID) | ORAL | Status: DC
  Administered 2012-02-15 – 2012-02-16 (×2): 3.125 mg via ORAL
  Filled 2012-02-15 (×5): qty 1

## 2012-02-15 MED ORDER — MIDAZOLAM HCL 10 MG/2ML IJ SOLN
INTRAMUSCULAR | Status: AC
  Filled 2012-02-15: qty 2

## 2012-02-15 MED ORDER — LISINOPRIL 5 MG PO TABS
5.0000 mg | ORAL_TABLET | Freq: Every day | ORAL | Status: DC
  Administered 2012-02-15 – 2012-02-17 (×3): 5 mg via ORAL
  Filled 2012-02-15 (×3): qty 1

## 2012-02-15 MED ORDER — FLUCONAZOLE 100 MG PO TABS
100.0000 mg | ORAL_TABLET | Freq: Every day | ORAL | Status: DC
  Administered 2012-02-15 – 2012-02-17 (×3): 100 mg via ORAL
  Filled 2012-02-15 (×4): qty 1

## 2012-02-15 MED ORDER — SODIUM CHLORIDE 0.9 % IJ SOLN
3.0000 mL | INTRAMUSCULAR | Status: DC | PRN
  Administered 2012-02-15: 3 mL via INTRAVENOUS

## 2012-02-15 NOTE — ED Notes (Signed)
Called blood bank.  Blood will be ready around 0245

## 2012-02-15 NOTE — ED Notes (Signed)
Called pt's sister per his request.  Pt watching TV.  No needs at this time.

## 2012-02-15 NOTE — H&P (Signed)
Internal Medicine Attending Admission Note Date: 02/15/2012  Patient name: Eddie [name] Medical record number: [id number] Date of birth: 1941/11/06 Age: 71 y.o. Gender: male  I saw and evaluated the patient. I reviewed the resident's note and I agree with the resident's findings and plan as documented in the resident's note.  Chief Complaint(s):  Dyspnea on exertion, mild dizziness.  History - key components related to admission:  Mr. 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 At that time he did not mention any complaints. Routine blood work was obtained and when it came back last night his hemoglobin had dropped from 9 in December to 6 yesterday. He was therefore called in for evaluation and admission. Upon further questioning he admitted to having black tarry stool for the last month. The last one to 2 weeks he's had increasing dyspnea on exertion having to stop after about a half a block, which was unusual for him. He also noted some mild dizziness during this time frame. He denies any bright red blood per rectum or hematemesis. He also denies any hematuria or other unusual bleeding. He's had no vomiting, nausea, abdominal pain, or diarrhea. He also denies chest pain although he has had shortness of breath with exertion but not at rest. He takes an aspirin a day but denies using Goode powders, BC powders, ibuprofen or Motrin, or any other NSAIDs or over-the-counter medications. He says he has not drank alcohol in over 4 years. He is without other complaints at this time.  Physical Exam - key components related to admission:  Filed Vitals:   02/15/12 0900 02/15/12 0945  02/15/12 1045 02/15/12 1126  BP: 134/83 130/83 126/84 100/71  Pulse: 70 70 68 70  Temp: 98.4 F (36.9 C) 98.2 F (36.8 C) 98.6 F (37 C) 97.8 F (36.6 C)  TempSrc: Oral Oral Oral Oral  Resp: 18 17 18 20   Height:      Weight:      SpO2:       Gen.: Well-developed well-nourished man lying comfortably in bed in no acute distress. Lungs: Clear to auscultation bilaterally. Heart: Regular rate and rhythm without murmurs, rubs, or gallops. Abdomen: Soft, non tender, active bowel sounds without masses. Extremities: Without clubbing or edema.  Lab results:  Basic Metabolic Panel:  Basename 02/14/12 2309 02/14/12 1155  NA 130* 135  K 5.3* 4.1  CL 98 103  CO2 20 20  GLUCOSE 337* 253*  BUN 16 17  CREATININE 0.98 1.16  CALCIUM 9.5 8.9  MG -- --  PHOS -- --   Liver Function Tests:  [facility] 02/14/12 2309 02/14/12 1155  AST 99* 47*  ALT 46 37  ALKPHOS 119* 117  BILITOT 0.5 0.7  PROT 7.5 6.6  ALBUMIN 3.5 3.7   CBC:  Basename 02/14/12 2309 02/14/12 1155  WBC 2.6* 2.5*  NEUTROABS 1.6* 1.6*  HGB 6.4* 6.0*  HCT 21.9* 21.3*  MCV 76.8* 78.9  PLT 76* 72*   CBG:  Basename 02/15/12 0859 02/14/12 1112  GLUCAP 256* 257*   Hemoglobin A1C:  Basename 02/14/12 1155  HGBA1C 10.8*   Fasting Lipid Panel:  Basename 02/14/12 1155  CHOL 140  HDL 47  LDLCALC 70  TRIG 116  CHOLHDL  3.0  LDLDIRECT --   Coagulation:  Basename 02/14/12 2309  INR 2.30*   Urine Drug Screen:  Negative  Imaging results:  No results found.  Other results:  EKG:  Normal sinus rhythm at 85 beats per minute, normal axis and intervals, good R wave progression, no ST changes, unchanged from the previous EKG on 09/28/2011.  Assessment & Plan by Problem:  Mr. Gallardo is a 71 year old man who presents with symptomatic anemia that is new since December. Given the dark tarry stools over the last month it is highly likely that the source of his blood loss is through the GI tract. Although he has  esophageal varices an esophageal variceal bleed is very unlikely given the chronicity of this blood loss. He also has portal gastropathy and I am unsure if you can have a chronic blood loss picture with a cortical portal gastropathy bleed. He has a history of esophagitis and this may have recurred. He also takes aspirin and may have developed a gastritis. He has recently had a colonoscopy in 2012 which was unremarkable. It is unlikely that he has a colonic source given this recent colonoscopic exam. He has received blood given his symptoms of dyspnea on exertion and dizziness.  1) Anemia: Given the likelihood of a GI source, gastroenterology will be called and asked if he is a candidate for an EGD. In the meantime, we will follow serial hematocrits every 12 hours. Will maintain 2 wide bore IVs at all times. We will continue with PPI therapy and hold his Coumadin and aspirin acutely as we try to determine the cause of his GI bleeding. There is no need to reverse his Coumadin with vitamin K given the chronicity of the bleed and his hemodynamic stability at this time. Further evaluation and therapy are pending the results of the GI evaluation.

## 2012-02-15 NOTE — Progress Notes (Signed)
Admitted with tarry stools.  Hgb on admission was 6.0 g/dL.  Getting pRBCs.  Home insulin regimen: Lantus 25 units QHS  A1C was drawn on admission (10.8%- 02/14/12).  Question accuracy of this A1C as patient was admitted with extremely low Hemoglobin level.    Noted Lantus 20 units QHS and Novolog Sensitive SSI started today.  This patient likely needs rapid-acting insulin at home with meals to improve his CBG control.  Will follow.  Ambrose Finland RN, MSN, CDE Diabetes Coordinator Inpatient Diabetes Program [phone]

## 2012-02-15 NOTE — ED Notes (Signed)
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

## 2012-02-15 NOTE — Assessment & Plan Note (Signed)
Status post bone marrow biopsy by Dr. Arbutus Ped which was completely normal. This is thought to be likely secondary to cirrhosis and hepatitis. -Continue to monitor for now.

## 2012-02-15 NOTE — Assessment & Plan Note (Signed)
He has been complaining of some leg swelling for last one week that is getting progressively worse. He also reports feeling fatigued, dyspnea on exertion. Differentials for his leg swelling include cirrhosis  versus renal insufficiency versus heart failure from chemoradiation versus anemia. Last CMP was reviewed that showed albumin of 3.8. - Get a 2-D echo to assess his systolic function.

## 2012-02-15 NOTE — ED Provider Notes (Signed)
History     CSN: [id number]  Arrival date & time 02/14/12  2156   First MD Initiated Contact with Patient 02/15/12 0035      Chief Complaint  Patient presents with  . low hgb     Patient is a 71 y.o. male presenting with weakness. The history is provided by the patient.  Weakness Primary symptoms do not include loss of consciousness, dizziness, nausea or vomiting. The symptoms began more than 1 week ago. The symptoms are worsening. The neurological symptoms are diffuse.  Additional symptoms include weakness.  pt reports he was called to come back to the hospital for abnormal labs He reports recent fatigue and dyspnea on exertion No cp No abd pain No vomiting/diarrhea No focal weakness   Past Medical History  Diagnosis Date  . Cancer     prostate ca  . Cancer of colon   . Cancer     tonsillar  . Upper GI bleeding     EGD on 01/2011 shows esophagitis, grade 1 varices  . Hypertension   . Cirrhosis   . Anemia   . Diabetes mellitus   . Hepatitis C   . Gallstone   . Thrush, oral   . GERD (gastroesophageal reflux disease)   . DM gastroparesis   . Gastritis   . Hernia   . Pancreatitis     Past Surgical History  Procedure Date  . Colon surgery   . Hernia repair     Family History  Problem Relation Age of Onset  . Breast cancer Mother   . Alzheimer's disease Mother     History  Substance Use Topics  . Smoking status: Never Smoker   . Smokeless tobacco: Never Used  . Alcohol Use: No      Review of Systems  Gastrointestinal: Negative for nausea and vomiting.  Neurological: Positive for weakness. Negative for dizziness and loss of consciousness.  All other systems reviewed and are negative.    Allergies  Review of patient's allergies indicates no known allergies.  Home Medications   Current Outpatient Rx  Name Route Sig Dispense Refill  . ASPIRIN 81 MG PO TBEC Oral Take 81 mg by mouth daily.      Marland Kitchen CARVEDILOL 3.125 MG PO TABS Oral Take 1 tablet  (3.125 mg total) by mouth 2 (two) times daily with a meal. 60 tablet 11  . FOLIC ACID 1 MG PO TABS Oral Take 1 tablet (1 mg total) by mouth daily. 100 tablet 3    WE SELL #100  FOR $10   PLEASE WRITE RX FOR #100  . INSULIN GLARGINE 100 UNIT/ML [place] SOLN  Inject 25 units into the skin subcutaneously at night. 10 mL 11  . LISINOPRIL 5 MG PO TABS Oral Take 1 tablet (5 mg total) by mouth daily. 30 tablet 2  . ONE-DAILY MULTI VITAMINS PO TABS Oral Take 1 tablet by mouth daily. 30 tablet 3  . BACITRACIN-NEOMYCIN-POLYMYXIN [id number] EX OINT Topical Apply topically every 12 (twelve) hours. apply to the affected area as directed. 15 g 0  . OMEPRAZOLE 40 MG PO CPDR Oral Take 1 capsule (40 mg total) by mouth daily. 30 capsule 11  . WARFARIN SODIUM 5 MG PO TABS Oral Take 5 mg by mouth as directed. 5mg  daily except 7.5mg  on Tu &Thu      BP 135/68  Pulse 77  Temp(Src) 98.6 F (37 C) (Oral)  Resp 19  SpO2 100%  Physical Exam CONSTITUTIONAL: Well developed/well nourished HEAD AND  FACE: Normocephalic/atraumatic EYES: EOMI/PERRL, conjunctiva pale ENMT: Mucous membranes moist NECK: supple no meningeal signs SPINE:entire spine nontender CV: S1/S2 noted, no murmurs/rubs/gallops noted LUNGS: Lungs are clear to auscultation bilaterally, no apparent distress ABDOMEN: soft, nontender, no rebound or guarding GU:no cva tenderness Rectal - stool color normal, hem negative, chaperone present NEURO: Pt is awake/alert, moves all extremitiesx4 EXTREMITIES: pulses normal, full ROM SKIN: warm, color normal PSYCH: no abnormalities of mood noted  ED Course  Procedures   Labs Reviewed  CBC - Abnormal; Notable for the following:    WBC 2.6 (*)    RBC 2.85 (*)    Hemoglobin 6.4 (*)    HCT 21.9 (*)    MCV 76.8 (*)    MCH 22.5 (*)    MCHC 29.2 (*)    RDW 17.3 (*)    Platelets 76 (*) PLATELET COUNT CONFIRMED BY SMEAR   All other components within normal limits  DIFFERENTIAL - Abnormal; Notable for the  following:    Neutro Abs 1.6 (*)    All other components within normal limits  PROTIME-INR - Abnormal; Notable for the following:    Prothrombin Time 25.7 (*)    INR 2.30 (*)    All other components within normal limits  COMPREHENSIVE METABOLIC PANEL - Abnormal; Notable for the following:    Sodium 130 (*)    Potassium 5.3 (*) HEMOLYSIS AT THIS LEVEL MAY AFFECT RESULT   Glucose, Bld 337 (*)    AST 99 (*) HEMOLYSIS AT THIS LEVEL MAY AFFECT RESULT   Alkaline Phosphatase 119 (*)    GFR calc non Af Amer 83 (*)    All other components within normal limits  APTT  SAMPLE TO BLOOD BANK  OCCULT BLOOD, POC DEVICE  PREPARE RBC (CROSSMATCH)  TYPE AND SCREEN     1. Anemia   2. Hyperglycemia    Pt with symptomatic anemia, but stable ,no signs of acute GI bleed D/w dr Gilford Rile, [facility], will admit BP 135/68  Pulse 77  Temp(Src) 98.6 F (37 C) (Oral)  Resp 19  SpO2 100% Pt stable in ED   MDM  Nursing notes reviewed and considered in documentation All labs/vitals reviewed and considered. Previous records reviewed and considered         Eddie Gaskins, MD 02/15/12 0222

## 2012-02-15 NOTE — Interval H&P Note (Signed)
History and Physical Interval Note:  02/15/2012 3:03 PM  Eddie [name]  has presented today for surgery, with the diagnosis of gi bleed, anemia  The various methods of treatment have been discussed with the patient and family. After consideration of risks, benefits and other options for treatment, the patient has consented to  Procedure(s) (LRB): ESOPHAGOGASTRODUODENOSCOPY (EGD) (N/A) as a surgical intervention .  The patients' history has been reviewed, patient examined, no change in status, stable for surgery.  I have reviewed the patients' chart and labs.  Questions were answered to the patient's satisfaction.     Venita Lick. Russella Dar MD [name]

## 2012-02-15 NOTE — Assessment & Plan Note (Signed)
He is on chronic Coumadin and follows up with Dr. Arbutus Ped for PT/INR check.

## 2012-02-15 NOTE — Consult Note (Signed)
I have taken a history, examined the patient and reviewed the chart. I agree with the extender's note, impression and recommendations.  Eulice Rutledge T Veron Senner MD FACG 

## 2012-02-15 NOTE — Progress Notes (Signed)
Subjective: Eddie [name] is resting comfortably in bed watching TV when I entered his room this morning. The second unit of PRBCs was running. He has no complaints and denies chest pain, shortness of breath, dyspnea, abdominal pain, nausea, vomiting, diarrhea rash, or light-headedness. He says he had a bowel movement a few hours ago that was brown and did not have any tarry or bright red stool in it. He has been drinking clear liquid diet.   Objective: Vital signs in last 24 hours: Filed Vitals:   02/15/12 0830 02/15/12 0845 02/15/12 0900 02/15/12 0945  BP: 116/76 125/82 134/83 130/83  Pulse: 73 72 70 70  Temp: 98 F (36.7 C) 98.3 F (36.8 C) 98.4 F (36.9 C) 98.2 F (36.8 C)  TempSrc: Oral Oral Oral Oral  Resp: 16 18 18 17   Height:      Weight:      SpO2:       Weight change:   Intake/Output Summary (Last 24 hours) at 02/15/12 1005 Last data filed at 02/15/12 0905  Gross per 24 hour  Intake 1102.5 ml  Output   1101 ml  Net    1.5 ml   Physical Exam: General: resting in bed comfortably, watching television. HEENT: Pale mucous membranes of conjunctiva and sublingual mucosa.PERRL, EOMI. Cardiac: RRR, no rubs, murmurs or gallops Pulm: clear to auscultation bilaterally, moving normal volumes of air Abd:  Mildly distended, BS present, nontender, no masses palpated. Ext: warm and well perfused, 1+ pedal edema Neuro: alert and oriented X3, cranial nerves II-XII grossly intact  Lab Results: Basic Metabolic Panel:  Lab 02/14/12 1610 02/14/12 1155  NA 130* 135  K 5.3* 4.1  CL 98 103  CO2 20 20  GLUCOSE 337* 253*  BUN 16 17  CREATININE 0.98 1.16  CALCIUM 9.5 8.9  MG -- --  PHOS -- --   Liver Function Tests:  Lab 02/14/12 2309 02/14/12 1155  AST 99* 47*  ALT 46 37  ALKPHOS 119* 117  BILITOT 0.5 0.7  PROT 7.5 6.6  ALBUMIN 3.5 3.7   CBC:  Lab 02/14/12 2309 02/14/12 1155  WBC 2.6* 2.5*  NEUTROABS 1.6* 1.6*  HGB 6.4* 6.0*  HCT 21.9* 21.3*  MCV 76.8* 78.9  PLT 76*  72*   CBG:  Lab 02/14/12 1112  GLUCAP 257*   Hemoglobin A1C:  Lab 02/14/12 1155  HGBA1C 10.8*   Fasting Lipid Panel:  Lab 02/14/12 1155  CHOL 140  HDL 47  LDLCALC 70  TRIG 116  CHOLHDL 3.0  LDLDIRECT --   Coagulation:  Lab 02/14/12 2309  LABPROT 25.7*  INR 2.30*   Urine Drug Screen: Drugs of Abuse     Component Value Date/Time   LABOPIA NONE DETECTED 02/05/2011 2003   COCAINSCRNUR NONE DETECTED 02/05/2011 2003   LABBENZ NONE DETECTED 02/05/2011 2003   AMPHETMU NONE DETECTED 02/05/2011 2003   THCU NONE DETECTED 02/05/2011 2003   LABBARB  Value: NONE DETECTED        DRUG SCREEN FOR MEDICAL PURPOSES ONLY.  IF CONFIRMATION IS NEEDED FOR ANY PURPOSE, NOTIFY LAB WITHIN 5 DAYS.        LOWEST DETECTABLE LIMITS FOR URINE DRUG SCREEN Drug Class       Cutoff (ng/mL) Amphetamine      1000 Barbiturate      200 Benzodiazepine   200 Tricyclics       300 Opiates          300 Cocaine  300 THC              50 02/05/2011 2003     Medications: I have reviewed the patient's current medications. Scheduled Meds:   . carvedilol  3.125 mg Oral BID WC  . folic acid  1 mg Oral Daily  . insulin aspart  0-9 Units Subcutaneous TID WC  . insulin glargine  20 Units Subcutaneous QHS  . lisinopril  5 mg Oral Daily  . pantoprazole  40 mg Oral Q1200  . 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  1.  Anemia-  Patient presents with hemoglobin of 6.0- 6.4 (repeated and verified), his hemoglobin was 9.0 in December 2012.  Currently hemodynamically stable and receiving second unit of PRBCs. One month history of dark, tarry stools concerning for upper  GI bleed. The bleed does seem to have remitted, as since admission his stools have been brown and FOBT was negative in ED. The rapidity of his Hb drop, history of dark stools, and low MCV points to blood loss and Fe deficiency as cause of microcytic anemia, although his is pancytopenic at baseline w Hb of 9.  Patient has a history HCV and UGI bleed in the past with last EGD in 01/2011 demonstrating Grade I esophageal varices and portal hypertensive gastropathy. He also has a history of colonic malignancy s/p L hemicolectomy which is concerning for malignancy as source of bleed, however, we would not expect a colonic bleed to be dark and tarry. 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  - GI consulted re: need to scope and patient management. Appreciate recommendations - Hold warfarin and ASA pending GI recs -Clear liquid diet, may advance pending GI recs - Follow orthostatic vitals - Hemolysis labs pending - CBC post transfusion pending and q 12 hours - Cardiac enzymes pending to look for possible ischemia in pt w GI bleed  2. Type 2 DM Pt's HbA1c is 10.8 and his is on lantus 25 units qhs on home. His diabetes is under poor control and he will likely require reevaluation of med regimen for tighter control as outpt. -Lantus 20 U qhs and SSi while inpatient. - Na 130 but corrected to 134 for glucose  3. Hyperkalemia Potassium was 5.3 in ED w no EKG changes. - Will check BMet again and give kayexalate as needed.  4. Elevated Liver Function Modest elevations in AST and Alk Phos for > 12yr. Presumed likely 2/2 HCV or chronic EtOH liver disease. Pt's albumin is 3.5, TBili is 0.5 and INR 2.3 on coumadin which is reassuring for preserved liver function. - Will need follow-up as outpatient.  5. Pancytopenia Evaluated and monitored by his Oncologist Dr. Arbutus Ped who indicated "The patient underwent a bone  marrow biopsy and aspirate on 11/09/2011 which showed no significant abnormality to explain his pancytopenia. And this most likely secondary to liver cirrhosis and hepatitis"  flow cytometry for CD55 and CD59 is negative for PNH on 12/24/11.  - continue to follow up with oncologist.   6.  HTN Pt is normotensive to hypertensive on admission w BP max 152/83.  -  Will continue home BP medications of Coreg and Lisinopril .  7. Chronic anticoagulation Pt is on lifelong warfarin due to history of mesenteric vein thrombosis with INR being followed by his hemo-oncologist Dr. Arbutus Ped, INR today is 2.3. - Given acute anemia requiring transfusion and worry for GI bleed will hold warfarin and ASA today. - Consider restarting pending GI recommendations.  VTE: SCD    LOS: 1 day   Bronson Curb 02/15/2012, 10:05 AM  Peng Thorstenson 02/15/2012 11:34 AM

## 2012-02-15 NOTE — Consult Note (Signed)
[facility] Consult: 10:53 AM 02/15/2012   Referring Provider:  Teaching service resident, Dr Tonny Branch Primary Care Physician:  [name], MD, MD Primary Gastroenterologist:  Dr. Marina Goodell   Reason for Consultation:  GI Bleed.  HPI: Eddie [name] is a 71 y.o. male.  Recovering alcoholic with Hep C, cirrhosis, IDDM. 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  HX cancers of tonsil, prostate as well.  On chronic coumadin for mesenteric vein thrombosis.  Last EGD was in 01/2011 showing Grad 1 esoph varices, portal gastropathy, esoph stricture, reflux esophagitis.  Last colonoscopy was Nov 2011 showing no recurrent polyps or tumors; scattered tics, melanosis, and hemorrhoids.  Admitted This AM with one month of black stools and outpatient Hgb of 6.0.  It was 9.0 in Dec 2012.  INR 2.3. Stools occur once daily, were initially soft and formed but recently loose and watery.  However stool was brown yesterday.    No n/v, abdominal pain, dizzyness, palpitations, chest pain. Takes 40 mg omeprazole daily, 81 mg ASA.  No NSAIDS.  No recent changes in meds or doses of meds.  Getting 2nd of 2 units PRBCs, has not received FFP or Vit K.     Past Medical History  Diagnosis Date  . Cancer     Status post curative radiotherapy with IMRT and androgen ablation for the prostate adenocarcinoma, completed December 10, 2008.  Marland Kitchen Cancer of colon     Status post left hemicolectomy at [facility] in November of 2010 revealing T3 N0 M0 colon adenocarcinoma and the patient did not receive adjuvant chemotherapy  . 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  Marland Kitchen Upper GI bleeding     EGD on 01/2011 shows esophagitis, grade 1 varices  . Hypertension   . Cirrhosis   . Anemia     bone marrow biopsy 11/2011,   . Diabetes mellitus   . Hepatitis C    . Gallstone   . Thrush, oral   . GERD (gastroesophageal reflux disease)   . DM gastroparesis   . Gastritis   . Hernia   . Pancreatitis   . Pancytopenia     Past Surgical History  Procedure Date  . Colon surgery   . Hernia repair     Prior to Admission medications   Medication Sig Start Date End Date Taking? Authorizing Provider  aspirin 81 MG EC tablet Take 81 mg by mouth daily.     Yes Historical Provider, MD  carvedilol (COREG) 3.125 MG tablet Take 1 tablet (3.125 mg total) by mouth 2 (two) times daily with a meal. 08/07/11  Yes Elyse Jarvis, MD  folic acid (FOLVITE) 1 MG tablet Take 1 tablet (1 mg total) by mouth daily. 11/19/11  Yes [name], MD  insulin glargine (LANTUS) 100 UNIT/ML injection Inject 25 units into the skin subcutaneously at night. 08/07/11  Yes Elyse Jarvis, MD  lisinopril (PRINIVIL,ZESTRIL) 5 MG tablet Take 1 tablet (5 mg total) by mouth daily. 02/14/12  Yes Elyse Jarvis, MD  Multiple Vitamin (MULTIVITAMIN) tablet Take 1 tablet by mouth daily. 08/07/11  Yes Elyse Jarvis, MD  neomycin-bacitracin-polymyxin (NEOSPORIN) ointment Apply topically every 12 (twelve) hours. apply to the affected area as directed. 08/07/11  Yes Elyse Jarvis, MD  omeprazole (PRILOSEC) 40 MG capsule Take 1 capsule (40 mg total) by mouth daily. 08/07/11 08/06/12 Yes Elyse Jarvis, MD  warfarin (  COUMADIN) 5 MG tablet Take 5 mg by mouth as directed. 5mg  daily except 7.5mg  on Tu &Thu   Yes Mohamed K. Mohamed, MD    Scheduled Meds:    . carvedilol  3.125 mg Oral BID WC  . folic acid  1 mg Oral Daily  . insulin aspart  0-9 Units Subcutaneous TID WC  . insulin glargine  20 Units Subcutaneous QHS  . lisinopril  5 mg Oral Daily  . pantoprazole  40 mg Oral Q1200  . sodium chloride  3 mL Intravenous Q12H   Infusions:   PRN Meds: sodium chloride, acetaminophen, acetaminophen, sodium chloride, zolpidem   Allergies as of 02/14/2012  . (No Known Allergies)    Family History  Problem  Relation Age of Onset  . Breast cancer Mother     mother still living and father passed with unsure reason.  . Alzheimer's disease Mother   . Healthy Sister     History   Social History  . Marital Status: Single    Spouse Name: N/A    Number of Children: N/A  . Years of Education: N/A   Occupational History  . Not on file.   Social History Main Topics  . Smoking status: Never Smoker   . Smokeless tobacco: Never Used  . Alcohol Use: No     patient used to drink 1.5 bottle of wine plus some liquor daily for 50 + years and quit drinking 4 years ago ( 2009)  . Drug Use: No  . Sexually Active: Not Currently   Other Topics Concern  . Not on file   Social History Narrative   Lives with his mother at home. Patient is single and retired, receive SS check. Takes care of his blind, demented 91 year old mom.      REVIEW OF SYSTEMS: Constitutional:  Weakness for 3 to 4 weeks,   ENT:  No epistaxis Pulm:  No cough.  DOE is not acute CV:  No chest pain.  Rapid beats when exerting GU:  No hematuria GI:  No dysphagia, no heartburn. Heme:  No Iron therapy.    Transfusions:  None that he recalls. Neuro:  No headaches, no tingling or numbness. No unilateral weakness Derm:  No rash, sores, itching. Endocrine:  No excessive thirst or urination Immunization:  flushot is up to date Travel:  None.   PHYSICAL EXAM: Vital signs in last 24 hours: Temp:  [96.8 F (36 C)-98.6 F (37 C)] 98.6 F (37 C) (02/15 1045) Pulse Rate:  [68-86] 68  (02/15 1045) Resp:  [5-20] 18  (02/15 1045) BP: (115-152)/(65-84) 126/84 mmHg (02/15 1045) SpO2:  [100 %] 100 % (02/15 0530) Weight:  [179 lb 14.3 oz (81.6 kg)-184 lb 9.6 oz (83.734 kg)] 179 lb 14.3 oz (81.6 kg) (02/15 0430)  General: pleasant, thin but well appearing AAM. No distress Head:  Normocephalic, atraumatic  Eyes:  Conjunctiva pale.  EOMI. Ears:  Not HOH, no hearing aid  Nose:  No discharge Mouth:  No lesions, full set of dentures.   Moist, pink MM Neck:  No JVD or masses. Lungs:  Clear B to A & P.  No dyspnea, no cough Heart: RRR.  No MRG. Abdomen:  Soft, ventral incisional hernia vs diasthesis at midline above umbilicus.  Not tender.  No HSM or mass  Rectal: did not repeat.  FOB positive yesterday Musc/Skeltl: no joint deformity or swelling Extremities:  No pedal edema.  Cap refill at hand slightly slow.  No palmar erythema.  No clubbing.  Neurologic:  Not confused.  Moves all 4s.  No tremor Skin:  No rash or sores.  No telangectasia Tattoos:  none Nodes:  None at neck    Psych:  Pleasant, relaxed, not depressed.   Intake/Output from previous day: 02/14 0701 - 02/15 0700 In: 862.5 [I.V.:500; Blood:362.5] Out: 401 [Urine:400; Stool:1] Intake/Output this shift: Total I/O In: 240 [P.O.:240] Out: 700 [Urine:700]  LAB RESULTS:  Basename 02/14/12 2309 02/14/12 1155  WBC 2.6* 2.5*  HGB 6.4* 6.0*  HCT 21.9* 21.3*  PLT 76* 72*   BMET Lab Results  Component Value Date   NA 130* 02/14/2012   NA 135 02/14/2012   NA 134* 12/24/2011   K 5.3* 02/14/2012   K 4.1 02/14/2012   K 4.6 12/24/2011   CL 98 02/14/2012   CL 103 02/14/2012   CL 102 12/24/2011   CO2 20 02/14/2012   CO2 20 02/14/2012   CO2 21 12/24/2011   GLUCOSE 337* 02/14/2012   GLUCOSE 253* 02/14/2012   GLUCOSE 358* 12/24/2011   BUN 16 02/14/2012   BUN 17 02/14/2012   BUN 12 12/24/2011   CREATININE 0.98 02/14/2012   CREATININE 1.16 02/14/2012   CREATININE 1.29 12/24/2011   CALCIUM 9.5 02/14/2012   CALCIUM 8.9 02/14/2012   CALCIUM 9.0 12/24/2011   LFT Lab Results  Component Value Date   PROT 7.5 02/14/2012   PROT 6.6 02/14/2012   PROT 7.0 12/24/2011   ALBUMIN 3.5 02/14/2012   ALBUMIN 3.7 02/14/2012   ALBUMIN 3.8 12/24/2011   AST 99* 02/14/2012   AST 47* 02/14/2012   AST 44* 12/24/2011   ALT 46 02/14/2012   ALT 37 02/14/2012   ALT 35 12/24/2011   ALKPHOS 119* 02/14/2012   ALKPHOS 117 02/14/2012   ALKPHOS 121* 12/24/2011   BILITOT 0.5 02/14/2012    BILITOT 0.7 02/14/2012   BILITOT 0.7 12/24/2011   PT/INR Lab Results  Component Value Date   INR 2.30* 02/14/2012   INR 2.50 01/24/2012   INR 2.5 01/24/2012   PROTIME 30.0* 01/24/2012   PROTIME 34.8* 12/24/2011   PROTIME 31.2* 12/03/2011   Hepatitis Panel No results found for this basename: HEPBSAG,HCVAB,HEPAIGM,HEPBIGM in the last 72 hours C-Diff No components found with this basename: cdiff    Drugs of Abuse     Component Value Date/Time   LABOPIA NONE DETECTED 02/05/2011 2003   COCAINSCRNUR NONE DETECTED 02/05/2011 2003   LABBENZ NONE DETECTED 02/05/2011 2003   AMPHETMU NONE DETECTED 02/05/2011 2003   THCU NONE DETECTED 02/05/2011 2003   LABBARB  Value: NONE DETECTED        DRUG SCREEN FOR MEDICAL PURPOSES ONLY.  IF CONFIRMATION IS NEEDED FOR ANY PURPOSE, NOTIFY LAB WITHIN 5 DAYS.        LOWEST DETECTABLE LIMITS FOR URINE DRUG SCREEN Drug Class       Cutoff (ng/mL) Amphetamine      1000 Barbiturate      200 Benzodiazepine   200 Tricyclics       300 Opiates          300 Cocaine          300 THC              50 02/05/2011 2003     RADIOLOGY STUDIES:  CT CHEST 03/22/2011. Findings: There is no axillary, mediastinal, or hilar  lymphadenopathy. Heart size is normal. Coronary artery  calcification is evident. There is no pericardial or pleural  effusion. Nodularity along the right aspect  the proximal trachea  is presumably secondary to adherent mucus.  The lungs are clear bilaterally.  Bone windows reveal no worrisome lytic or sclerotic osseous  lesions.  IMPRESSION:  No evidence for metastatic disease in the chest.  CT ABDOMEN AND PELVIS  03/22/2011 IMPRESSION:  Interval development of thrombus within mesenteric veins of the  central small bowel mesentery. The thrombus tracks up into the  superior mesenteric vein which is dilated, but not completely  occluded. The portal vein is patent.  Progression of dystrophic calcification and ductal dilatation of  the pancreatic tail. This is been  slowly progressive over multiple  sequential CT scans. Underlying pancreatic neoplasm would be a  consideration.  Cirrhosis with peri gastric and para esophageal varices.  Cholelithiasis.  ERIC A. MANSELL, M.D.   ENDOSCOPIC STUDIES:  02/06/2011   EGD   Lina Sar Distal esophagitis and esoph stricture.  Grade 1 esoph varices, portal gastropathy, HH.  10/2010   Colonoscopy   Yancey Flemings   Surveillance.   1) Anastomosis in the descending colon  2) Melanosis throughout the colon  3) Diverticula, scattered  4) No polyps or cancers  5) Internal hemorrhoids  RECOMMENDATIONS:  1) Follow up colonoscopy in 2 years   12/2009   EGD   Stan Head    Hemetemesis. 1) Erosions, multiple in the distal esophagus - GERD from 30-36  cm  2) 4 cm hiatal hernia  3) 1 cm sessile polyp in the antrum ( STOMACH, POLYP, BIOPSY: HYPERPLASTIC GASTRIC MUCOSA WITH ASSOCIATED MILD CHRONIC INFLAMMATION AND SURFACE EROSION, NO EVIDENCE OF HELICOBACTER PYLORI, INTESTINAL METAPLASIA, DYSPLASIA OR MALIGNANCY IDENTIFIED) 4) 2 mm AVM in the antrum  5) Otherwise normal examination - no stignata of portal  hypertension seen  RECOMMENDATIONS:  bid PPI (was not on prior to admit and has known recurrent GERD  I will follow-up pathology and coordinate GI follow-up after dc  if ok tomorrow could go home  REPEAT EXAM:: given cirrhosis, consider repeat EGD in 1 year to  screen for varices (he has well-compensated disease). Will defer  to Dr. Marina Goodell.  Iva Boop, MD, [facility]   08/2009   Colonoscopy   John Marina Goodell   Routine screening. 1) Malignant appearing mass in the distal transverse colon - s/p biopsies  2) Pedunculated polyp in the cecum- removed  3) Moderate diverticulosis throughout the colon  EGD  11/2006  Lina Sar  Hiccups, n/v. moderately severe reflux esophagitis and large hiatal hernia  02/2004   EGD   Yancey Flemings Reflux esophagitis, esoph stricture, Cameron erosions in [facility], acute gastritis with some  erosions.   IMPRESSION: 1.  GI Bleed.  Suspect this is blood loss from portal gastropathy.  Need to rule out variceal bleed though indolent nature of blood loss counts against this as cause.  Rule out ulcers or severe reflux as cause. 2.  Portal gastropathy, grade 1 varices on EGD one year ago.  Cirrhosis 3.  Colon cancer with left hemicolectomy Nov 2010.  "Clean" colonoscopy in Sep 2010, due follow up in 08/2012. 4.  Pancreatic calcification on CT of 03/2011. 5.  Hx mesenteric vein thrombosis, on daily Coumadin with therapeutic INR.   6.  Alcoholism.  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  7.  IDDM  PLAN: 1.  EGD this afternoon, tentatively set for 3 PM, sooner if FFP completed rapidly.  Needs to get 2 units FFP beforehand.     LOS: 1 day   Jennye Moccasin  02/15/2012, 10:53  AM Pager: [phone]

## 2012-02-15 NOTE — H&P (Signed)
Hospital Admission Note Date: 02/15/2012  Patient name: Eddie [name] Medical record number: [id number] Date of birth: [birth year] Age: 71 y.o. Gender: male PCP: [name], MD, MD  Medical Service: [facility] internal medicine teaching service  Attending physician: Dr. Doneen Poisson    1st Contact:  Dr. Blanca Friend   [phone] 2nd Contact:  Dr. Bard Herbert   Pager:[phone]  After 5 pm or weekends: 1st Contact:      Pager: [phone] 2nd Contact:      Pager: [phone]  Chief Complaint: black stools x 1 month  History of Present Illness: This is a 71 year old male with PMH of Upper GIB, HCV, alcohol abuse, DMII, 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 Patient states that he has had averagely one black stool daily for one month, which first presented with soft and formed, and later changed to loose/watery, no foul smell. Patient states that he feels fine otherwise and actually has one brown stool today.  Patient came to [facility] for his routine follow up visit on 02/14/12 and was found to have HGB of 6.0 with last HGB 9.0 in December of 2012. Patient was brought in for further evaluation.  Denies nausea, vomiting,  abdominal pain or diarrhea. Denies any bright red blood in stool. Denies dizziness or lightheadedness.  No headache, fever, or sore throat. No shortness of breath or dyspnea on exertion. No chest pain, chest pressure or palpitation     Denies dysuria, urinary frequency or urgency.       Medication List  As of 02/15/2012  6:30 AM   ASK your doctor about these medications         aspirin 81 MG EC tablet   Take 81 mg by mouth daily.      carvedilol 3.125 MG tablet   Commonly known as: COREG   Take 1 tablet (3.125 mg total) by mouth 2 (two) times daily with a meal.      folic acid 1 MG tablet   Commonly known as: FOLVITE   Take 1 tablet (1 mg total)  by mouth daily.      insulin glargine 100 UNIT/ML injection   Commonly known as: LANTUS   Inject 25 units into the skin subcutaneously at night.      lisinopril 5 MG tablet   Commonly known as: PRINIVIL,ZESTRIL   Take 1 tablet (5 mg total) by mouth daily.      multivitamin tablet   Take 1 tablet by mouth daily.      neomycin-bacitracin-polymyxin ointment   Commonly known as: NEOSPORIN   Apply topically every 12 (twelve) hours. apply to the affected area as directed.      omeprazole 40 MG capsule   Commonly known as: PRILOSEC   Take 1 capsule (40 mg total) by mouth daily.      warfarin 5 MG tablet   Commonly known as: COUMADIN   Take 5 mg by mouth as directed. 5mg  daily except 7.5mg  on Tu &Thu            Allergies: Review of patient's allergies indicates no known allergies. Past Medical History  Diagnosis Date  . Cancer     Status post curative radiotherapy with IMRT and androgen ablation for the prostate adenocarcinoma, completed December 10, 2008.  Marland Kitchen Cancer of colon     Status post left hemicolectomy at [facility] in November  of 2010 revealing T3 N0 M0 colon adenocarcinoma and the patient did not receive adjuvant chemotherapy  . 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  Marland Kitchen Upper GI bleeding     EGD on 01/2011 shows esophagitis, grade 1 varices  . Hypertension   . Cirrhosis   . Anemia   . Diabetes mellitus   . Hepatitis C   . Gallstone   . Thrush, oral   . GERD (gastroesophageal reflux disease)   . DM gastroparesis   . Gastritis   . Hernia   . Pancreatitis   . Pancytopenia    Past Surgical History  Procedure Date  . Colon surgery   . Hernia repair    Family History  Problem Relation Age of Onset  . Breast cancer Mother     mother still living and father passed with unsure reason.  . Alzheimer's disease Mother   . Healthy Sister      History   Social History  . Marital Status: Single    Spouse Name: N/A    Number of Children: N/A  . Years of Education: N/A   Occupational History  . Not on file.   Social History Main Topics  . Smoking status: Never Smoker   . Smokeless tobacco: Never Used  . Alcohol Use: No     patient used to drink 1.5 bottle of wine plus some liquor daily for 50 + years and quit drinking 4 years ago  . Drug Use: No  . Sexually Active: Not Currently   Other Topics Concern  . Not on file   Social History Narrative   Lives with his mother at home. Patient is single and retired, receive SS check.    Review of Systems: See above HPI   Physical Exam: Blood pressure 126/76, pulse 71, temperature 97.8 F (36.6 C), temperature source Oral, resp. rate 20, height 6' (1.829 m), weight 179 lb 14.3 oz (81.6 kg), SpO2 100.00%.  General:NAD, alert, well-developed, and cooperative to examination.  Head: normocephalic and atraumatic.  Eyes: vision grossly intact, pupils equal, pupils round, pupils reactive to light, no injection and anicteric.  Mouth: pharynx pink and moist, no erythema, and no exudates.  Neck: supple, full ROM, no thyromegaly, no JVD, and no carotid bruits.  Lungs: normal respiratory effort, no accessory muscle use, normal breath sounds, no crackles, and no wheezes. Heart: normal rate, regular rhythm, no murmur, no gallop, and no rub.  Abdomen: Rounded, soft, non-tender, normal bowel sounds, no distention, no guarding, no rebound tenderness, liver palpable below costal margin. no splenomegaly.  Msk: no joint swelling, no joint warmth, and no redness over joints.  Pulses: 2+ DP/PT pulses bilaterally Extremities: No cyanosis or clubbing. B/L 1+ pittig edema Neurologic: alert & oriented X3, cranial nerves II-XII intact, strength normal in all extremities, sensation intact to light touch, and gait normal.  Skin: turgor normal and no rashes.  Psych: Oriented X3, memory intact for  recent and remote, normally interactive, good eye contact, not anxious appearing, and not depressed appearing.  Lab results: Basic Metabolic Panel:  Basename 02/14/12 2309 02/14/12 1155  Jancarlos Thrun 130* 135  K 5.3* 4.1  CL 98 103  CO2 20 20  GLUCOSE 337* 253*  BUN 16 17  CREATININE 0.98 1.16  CALCIUM 9.5 8.9  MG -- --  PHOS -- --   Liver Function Tests:  Basename 02/14/12 2309 02/14/12 1155  AST 99* 47*  ALT 46 37  ALKPHOS 119* 117  BILITOT 0.5 0.7  PROT 7.5 6.6  ALBUMIN 3.5 3.7   CBC:  Basename 02/14/12 2309 02/14/12 1155  WBC 2.6* 2.5*  NEUTROABS 1.6* 1.6*  HGB 6.4* 6.0*  HCT 21.9* 21.3*  MCV 76.8* 78.9  PLT 76* 72*   CBG:  Basename 02/14/12 1112  GLUCAP 257*   Hemoglobin A1C:  Basename 02/14/12 1155  HGBA1C 10.8*   Fasting Lipid Panel:  Basename 02/14/12 1155  CHOL 140  HDL 47  LDLCALC 70  TRIG 116  CHOLHDL 3.0  LDLDIRECT --  Coagulation:  Basename 02/14/12 2309  LABPROT 25.7*  INR 2.30*   Urine Drug Screen: Drugs of Abuse     Component Value Date/Time   LABOPIA NONE DETECTED 02/05/2011 2003   COCAINSCRNUR NONE DETECTED 02/05/2011 2003   LABBENZ NONE DETECTED 02/05/2011 2003   AMPHETMU NONE DETECTED 02/05/2011 2003   THCU NONE DETECTED 02/05/2011 2003   LABBARB  Value: NONE DETECTED        DRUG SCREEN FOR MEDICAL PURPOSES ONLY.  IF CONFIRMATION IS NEEDED FOR ANY PURPOSE, NOTIFY LAB WITHIN 5 DAYS.        LOWEST DETECTABLE LIMITS FOR URINE DRUG SCREEN Drug Class       Cutoff (ng/mL) Amphetamine      1000 Barbiturate      200 Benzodiazepine   200 Tricyclics       300 Opiates          300 Cocaine          300 THC              50 02/05/2011 2003    Other results: EKG: NSR  Assessment & Plan by Problem:  # Anemia- Patient presents with hemoglobin of 6.0- 6.4 (repeated and verified), his hemoglobin was 9.0 in December 2012.  This acute drop is in the setting of reported black stools for one month, which has now resolved with stool FOBT is negative in the ED.  Currently hemodynamically stable. Patient has a history HCV, therefore likely has portal hypertension and esophageal varices vs history of GI malignancy which could have causes this acute drop in his hemoglobin.  Plan: --Admit to regular floor -  clear liquid for now, may advanced as tol if GIB stopped. --Check orthostatic vitals --Hemocult, CBC, anemia panel, CMET  ---Repeat CBC in am post transfusion. Also CBC Q 12 hours ---Peripheral smear, Haptoglobin, LDH, if sign of hemolysis are present consider hematology consult. --Transfuse 2 units PRBC  ---Hold warfarin and aspirin --GI consult recommended given history.   # DM2 - A1c today 10.8, pt is on lantus at home, will place patient on reduce dose of lantus during hospitalization and start SSI along with CBG monitoring.  - will need tighter CBG control as outpatient once he is stabilized and discharged  # Electrolyte imbalance   Maleeka Sabatino 130 and corrected to be at 134. His K was 4.1 this am and 5.3 in ED without EKG changes - will monitor BMP closely  # Elevated LFT, patient noted to have elevated AST and Alkaline phosphatase since last year, which likely due to HCV vs alcohol induced liver disease.  - will need monitor as outpatient  # Pancytopenia, Hx,    Evaluated and monitored by his Oncologist Dr. Arbutus Ped who indicated "The patient underwent a bone marrow biopsy and aspirate on 11/09/2011 which showed no significant abnormality to explain his pancytopenia. And this most likely secondary to  liver cirrhosis and hepatitis"   flow cytometry for CD55 and CD59 is negative for PNH on 12/24/11. - continue monitor - continue to follow up with oncologist.  # HTN, Continue home regimen.  # Long-term Anticoagulation -  Pt is on lifelong warfarin due to history of mesenteric vein thrombosis with INR being followed by his hemo-oncologist Dr. Arbutus Ped, INR today is 2.3, given acute anemia, will hold warfarin and asa.      # VTE:  SCD      Signed: Aletheia Tangredi 02/15/2012, 6:30 AM

## 2012-02-15 NOTE — Op Note (Signed)
[facility] [address] [place], [place]  [address]  ENDOSCOPY PROCEDURE REPORT PATIENT:  Dason, Mosley  MR#:  [id number] BIRTHDATE:  1941-01-27, 70 yrs. old  GENDER:  male ENDOSCOPIST:  Judie Petit T. Russella Dar, MD, [facility]  PROCEDURE DATE:  02/15/2012 PROCEDURE:  EGD, diagnostic 43235 ASA CLASS:  Class III INDICATIONS:  hemorrhage of GI tract, cirrhosis, anemia MEDICATIONS:  Fentanyl 75 mcg IV, Versed 7 mg IV TOPICAL ANESTHETIC:  Cetacaine Spray DESCRIPTION OF PROCEDURE:   After the risks benefits and alternatives of the procedure were thoroughly explained, informed consent was obtained.  The Pentax Gastroscope [id number] endoscope was introduced through the mouth and advanced to the second portion of the duodenum, without limitations.  The instrument was slowly withdrawn as the mucosa was fully examined. <<PROCEDUREIMAGES>> Gastropathy was found in the antrum. It was friable, erythematous and oozing blood which stoppd spontaneously.  An ulcer was found in the body of the stomach. It was linear with marked erythem and clean based. It was 5 mm in size. Otherwise normal stomach.  Grade II varices were found in the distal esophagus. White. yellow exudates in the proximal esophagus.  Otherwise normal esophagus. The duodenal bulb was normal in appearance, as was the postbulbar duodenum.  Retroflexed views revealed gastric varices and a hiatal hernia. The scope was then withdrawn from the patient and the procedure completed. COMPLICATIONS:  None  ENDOSCOPIC IMPRESSION: 1) Portal gastropathy in the antrum 2) Gastric varices in the cardia 3) 5 mm ulcer in the body of the stomach 4) Grade II varices in the distal esophagus 5) Exudate in the proximal esophagus 6) Small hiatal hernia  RECOMMENDATIONS: 1) Avoid ASA/NSAIDs 2) PPI qam long term 3) Naldolol for portal hypertension if no contraindications from primary service 4) Hold Coumadin and consider discontinuing  long term as this will raise the risk of rebleeding from gastopathy 4) Diflucan for candida esophagitis  Shamarie Call T. Russella Dar, MD, [name]  n. eSIGNED:   Venita Lick. Kristie Bracewell at 02/15/2012 03:58 PM  Marquita Palms, [phone]

## 2012-02-15 NOTE — ED Notes (Signed)
Pt denies any needs at this time.  Pt states he feels fine and has had no complaints or bleeding.

## 2012-02-16 ENCOUNTER — Other Ambulatory Visit: Payer: Self-pay | Admitting: Oncology

## 2012-02-16 LAB — CBC
HCT: 24 % — ABNORMAL LOW (ref 39.0–52.0)
Hemoglobin: 7.4 g/dL — ABNORMAL LOW (ref 13.0–17.0)
MCH: 24 pg — ABNORMAL LOW (ref 26.0–34.0)
MCH: 23.6 pg — ABNORMAL LOW (ref 26.0–34.0)
MCHC: 30.8 g/dL (ref 30.0–36.0)
MCHC: 30.2 g/dL (ref 30.0–36.0)
MCV: 77.9 fL — ABNORMAL LOW (ref 78.0–100.0)
MCV: 77.9 fL — ABNORMAL LOW (ref 78.0–100.0)
Platelets: 54 10*3/uL — ABNORMAL LOW (ref 150–400)
RBC: 3.31 MIL/uL — ABNORMAL LOW (ref 4.22–5.81)
RDW: 16.9 % — ABNORMAL HIGH (ref 11.5–15.5)
RDW: 16.8 % — ABNORMAL HIGH (ref 11.5–15.5)

## 2012-02-16 LAB — BASIC METABOLIC PANEL
BUN: 12 mg/dL (ref 6–23)
Calcium: 9.3 mg/dL (ref 8.4–10.5)
Creatinine, Ser: 0.91 mg/dL (ref 0.50–1.35)
GFR calc Af Amer: >90 mL/min (ref >90)
GFR calc non Af Amer: 85 mL/min — ABNORMAL LOW (ref >90)
Glucose, Bld: 126 mg/dL — ABNORMAL HIGH (ref 70–99)
Potassium: 4.3 mEq/L (ref 3.5–5.1)

## 2012-02-16 LAB — PREPARE FRESH FROZEN PLASMA
Unit division: 0
Unit division: 0

## 2012-02-16 LAB — TYPE AND SCREEN
ABO/RH(D): B POS
Antibody Screen: NEGATIVE
Unit division: 0

## 2012-02-16 LAB — FERRITIN: Ferritin: 14 ng/mL — ABNORMAL LOW (ref 22–322)

## 2012-02-16 LAB — GLUCOSE, CAPILLARY
Glucose-Capillary: 250 mg/dL — ABNORMAL HIGH (ref 70–99)
Glucose-Capillary: 121 mg/dL — ABNORMAL HIGH (ref 70–99)
Glucose-Capillary: 141 mg/dL — ABNORMAL HIGH (ref 70–99)

## 2012-02-16 LAB — IRON AND TIBC
Iron: 22 ug/dL — ABNORMAL LOW (ref 42–135)
Saturation Ratios: 5 % — ABNORMAL LOW (ref 20–55)
TIBC: 408 ug/dL (ref 215–435)
UIBC: 386 ug/dL (ref 125–400)

## 2012-02-16 MED ORDER — FLUCONAZOLE 100 MG PO TABS: 100.0000 mg | ORAL_TABLET | Freq: Every day | ORAL | Status: DC

## 2012-02-16 MED ORDER — NADOLOL 40 MG PO TABS
40.0000 mg | ORAL_TABLET | Freq: Every day | ORAL | Status: DC
  Administered 2012-02-16 – 2012-02-17 (×2): 40 mg via ORAL
  Filled 2012-02-16 (×2): qty 1

## 2012-02-16 NOTE — Progress Notes (Signed)
Patient ID: Eddie [name], male   DOB: 05/21/1941, 71 y.o.   MRN: [id number]  [facility] Progress Note  Subjective: Reports no BMs. Feels ok.  Objective:  Vital signs in last 24 hours: Temp:  [97.5 F (36.4 C)-98.7 F (37.1 C)] 98.3 F (36.8 C) (02/16 0848) Pulse Rate:  [62-81] 72  (02/16 0848) Resp:  [10-23] 18  (02/16 0848) BP: (100-164)/(59-92) 112/71 mmHg (02/16 0848) SpO2:  [98 %-100 %] 99 % (02/16 0848) Last BM Date: 02/15/12 General:   Alert,  Well-developed,  white male in NAD Heart:  Regular rate and rhythm; no murmurs Abdomen:  Soft, nontender and mildly distended-possible acsites. Normal bowel sounds, without guarding, and without rebound.   Extremities:  Without edema. Neurologic:  Alert and  oriented x4;  grossly normal neurologically. Psych:  Alert and cooperative. Normal mood and affect.  Intake/Output from previous day: 02/15 0701 - 02/16 0700 In: 2357 [P.O.:1320; Blood:1037] Out: 1300 [Urine:1300] Intake/Output this shift:    Lab Results:  Basename 02/16/12 0630 02/15/12 1737 02/14/12 2309  WBC 2.6* 2.0* 2.6*  HGB 7.4* 7.8* 6.4*  HCT 24.0* 25.1* 21.9*  PLT 53* 51* 76*   BMET  Basename 02/16/12 0630 02/14/12 2309 02/14/12 1155  NA 138 130* 135  K 4.3 5.3* 4.1  CL 110 98 103  CO2 24 20 20   GLUCOSE 126* 337* 253*  BUN 12 16 17   CREATININE 0.91 0.98 1.16  CALCIUM 9.3 9.5 8.9   LFT  Basename 02/14/12 2309  PROT 7.5  ALBUMIN 3.5  AST 99*  ALT 46  ALKPHOS 119*  BILITOT 0.5  BILIDIR --  IBILI --   PT/INR  Basename 02/14/12 2309  LABPROT 25.7*  INR 2.30*    Assessment / Plan:  1. GI Bleed from portal gastropathy and gastric ulcer. Primary service to consider starting Naldolol to reduce portal pressures and reduce risk of GI bleeding. Monitor anemia. Continue PPI long term. 2. Grade II esophageal and gastric varices on EGD.  3. Colon cancer with left hemicolectomy Nov 2010. "Clean" colonoscopy in Sep 2010, due follow  up in 08/2012.  4. Pancreatic calcification on CT of 03/2011.  5. 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 Primary service also to consider discontinuing Coumadin if medically appropriate. Would hold Coumadin for at least 1 week.  6. Alcoholism. 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  7. Cirrhosis, possible ascites. Schedule abd [place]. 8. IDDM   Principal Problem:  *Acute blood loss anemia Active Problems:  DIABETES MELLITUS, TYPE II, CONTROLLED  Essential hypertension, benign  Portal hypertensive gastropathy  Esophageal varices in cirrhosis     LOS: 2 days   Stephaie Dardis T. Russella Dar MD [facility] 02/16/2012, 9:23 AM

## 2012-02-16 NOTE — Progress Notes (Signed)
Patient returned from visiting mother. No acute distress noted. Patient gait steady, ambulates independently.

## 2012-02-16 NOTE — Progress Notes (Addendum)
Subjective: Doing well today.  Tolerated the EGD well and slept well overnight.  He has had no recurrence of the black stools.  He states that he is not dizzy on standing and was able to walk to the bathroom and back with no problems.  He has been eating well and asked about advancing his diet.    Objective: Vital signs in last 24 hours: Filed Vitals:   02/15/12 1645 02/15/12 1800 02/15/12 2037 02/16/12 0505  BP: 122/80 130/75 126/83 112/70  Pulse: 65 72 81 81  Temp: 97.7 F (36.5 C) 98.3 F (36.8 C) 97.9 F (36.6 C) 98.7 F (37.1 C)  TempSrc: Oral Oral Oral Oral  Resp: 15 18 18 18   Height:      Weight:      SpO2:  98% 100% 99%   Weight change:   Intake/Output Summary (Last 24 hours) at 02/16/12 0849 Last data filed at 02/16/12 0544  Gross per 24 hour  Intake   1877 ml  Output    600 ml  Net   1277 ml   Physical Exam: Vitals reviewed. General: resting in bed, NAD HEENT: PERRL, EOMI, no scleral icterus Cardiac: RRR, no rubs, murmurs or gallops Pulm: clear to auscultation bilaterally, no wheezes, rales, or rhonchi Abd: soft, nontender, nondistended, BS present Ext: warm and well perfused, no pedal edema Neuro: alert and oriented X3, cranial nerves II-XII grossly intact, strength and sensation to light touch equal in bilateral upper and lower extremities  Lab Results: Basic Metabolic Panel:  Lab 02/16/12 1610 02/14/12 2309  NA 138 130*  K 4.3 5.3*  CL 110 98  CO2 24 20  GLUCOSE 126* 337*  BUN 12 16  CREATININE 0.91 0.98  CALCIUM 9.3 9.5  MG -- --  PHOS -- --   Liver Function Tests:  Lab 02/14/12 2309 02/14/12 1155  AST 99* 47*  ALT 46 37  ALKPHOS 119* 117  BILITOT 0.5 0.7  PROT 7.5 6.6  ALBUMIN 3.5 3.7   CBC:  Lab 02/16/12 0630 02/15/12 1737 02/14/12 2309 02/14/12 1155  WBC 2.6* 2.0* -- --  NEUTROABS -- -- 1.6* 1.6*  HGB 7.4* 7.8* -- --  HCT 24.0* 25.1* -- --  MCV 77.9* 77.5* -- --  PLT 53* 51* -- --   Cardiac Enzymes:  Lab 02/15/12 2021    CKTOTAL 289*  CKMB 4.9*  CKMBINDEX --  TROPONINI <0.30   CBG:  Lab 02/16/12 0746 02/15/12 2103 02/15/12 1750 02/15/12 1209 02/15/12 0859 02/14/12 1112  GLUCAP 121* 268* 141* 250* 256* 257*   Hemoglobin A1C:  Lab 02/14/12 1155  HGBA1C 10.8*   Fasting Lipid Panel:  Lab 02/14/12 1155  CHOL 140  HDL 47  LDLCALC 70  TRIG 116  CHOLHDL 3.0  LDLDIRECT --   Coagulation:  Lab 02/14/12 2309  LABPROT 25.7*  INR 2.30*   Urine Drug Screen: Drugs of Abuse     Component Value Date/Time   LABOPIA NONE DETECTED 02/05/2011 2003   COCAINSCRNUR NONE DETECTED 02/05/2011 2003   LABBENZ NONE DETECTED 02/05/2011 2003   AMPHETMU NONE DETECTED 02/05/2011 2003   THCU NONE DETECTED 02/05/2011 2003   LABBARB  Value: NONE DETECTED        DRUG SCREEN FOR MEDICAL PURPOSES ONLY.  IF CONFIRMATION IS NEEDED FOR ANY PURPOSE, NOTIFY LAB WITHIN 5 DAYS.        LOWEST DETECTABLE LIMITS FOR URINE DRUG SCREEN Drug Class       Cutoff (ng/mL) Amphetamine  1000 Barbiturate      200 Benzodiazepine   200 Tricyclics       300 Opiates          300 Cocaine          300 THC              50 02/05/2011 2003    Medications: I have reviewed the patient's current medications. Scheduled Meds:   . fluconazole  100 mg Oral Daily  . fluconazole  100 mg Oral Daily  . folic acid  1 mg Oral Daily  . insulin aspart  0-9 Units Subcutaneous TID WC  . insulin glargine  20 Units Subcutaneous QHS  . lisinopril  5 mg Oral Daily  . nadolol  40 mg Oral Daily  . pantoprazole  40 mg Oral Q1200  . sodium chloride  3 mL Intravenous Q12H  . DISCONTD: carvedilol  3.125 mg Oral BID WC   Continuous Infusions:  PRN Meds:.sodium chloride, acetaminophen, acetaminophen, sodium chloride, zolpidem, DISCONTD: butamben-tetracaine-benzocaine, DISCONTD: fentaNYL, DISCONTD: midazolam  Assessment/Plan: 1. Anemia: Patient presents with hemoglobin of 6.0- 6.4 (repeated and verified), his hemoglobin was 9.0 in December 2012. Currently hemodynamically stable  and receiving second unit of PRBCs. One month history of dark, tarry stools concerning for upper GI bleed. The bleed does seem to have remitted, as since admission his stools have been brown and FOBT was negative in ED. The rapidity of his Hb drop, history of dark stools, and low MCV points to blood loss and Fe deficiency as cause of microcytic anemia, although his is pancytopenic at baseline w Hb of 9. Anemia panel has not been done yet so we will do that today though it may not be excessively helpful given the fact that he has gotten 2 units of blood already.   Patient has a history HCV and UGI bleed in the past with last EGD in 01/2011 demonstrating Grade I esophageal varices and portal hypertensive gastropathy. He also has a history of colonic malignancy s/p L hemicolectomy which is concerning for malignancy as source of bleed, however, we would not expect a colonic bleed to be dark and tarry. 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  GI performed EGD yesterday and noted portal gastropathy in the antrum, Varices, small ulcer in the body of the stomach, and exudate int he proximal esophagus.  They suggested PPI, Nadolol for portal hypertension, diflucan for candida esophagitis as well as considering stopping warfarin and ASA long term because of the risk of rebleed. We will likely have to discuss this with his heme/Onc doctor Dr. Arbutus Ped before discharge. After two units HgB increased from 6.4 to 7.8.  - Hold warfarin and ASA pending GI recs  - Advancing diet.  - CBC q 12 hours for today.   2. Type 2 DM: Pt's HbA1c is 10.8 and his is on lantus 25 units qhs on home. His diabetes is under poor control and he will likely require reevaluation of med regimen for tighter control as outpt. Better control since admission with [facility] though he has only received 4 units total since admission in [facility].  This will likely  increase when he starts eating again so we will continue to monitor.  -Lantus 20 U qhs and SSi while inpatient.   3. Hyperkalemia: Potassium was 5.3 in ED w no EKG changes but has resolved the morning after with no intervention.  4. Elevated Liver Function:  Modest elevations in AST and Alk Phos for > 109yr. Presumed likely 2/2 HCV or chronic EtOH liver disease. Pt's albumin is 3.5, TBili is 0.5 and INR 2.3 on coumadin which is reassuring for preserved liver function. He is followed by Dr. Marina Goodell of GI.   5. Pancytopenia:  Evaluated and monitored by his Oncologist Dr. Arbutus Ped who indicated "The patient underwent a bone marrow biopsy and aspirate on 11/09/2011 which showed no significant abnormality to explain his pancytopenia. And this most likely secondary to liver cirrhosis and hepatitis"  flow cytometry for CD55 and CD59 is negative for PNH on 12/24/11.  - continue to follow up with oncologist.   6. HTN: Pt is normotensive to hypertensive on admission w BP max 152/83. With GI's recommendation of a non-selective beta blocker we will d/c his coreg and start Nadolol at 40 mg daily and titrate to effect.  - Will continue home BP medications of Nadolol and Lisinopril    7. Chronic anticoagulation: Pt is on lifelong warfarin due to history of mesenteric vein thrombosis with INR being followed by his hemo-oncologist Dr. Arbutus Ped, INR today is 2.3. GI recommends stopping coumadin because of the risk of rebleeding.  I discussed the case with Dr. Darnelle Catalan who states that it appears from their records that the clot was associated with the Colon cancer that was diagnosed at [facility] and he has had surgery to remove the cancer that was likely the cause of the clot.  He would have personally not continued the coumadin beyond 6 months which we are coming up on (appears he was started in 09/2011) and that he would feel comfortable stopping it with the risk of GI bleeding at this time.  He will inform Dr. Arbutus Ped and the  patient has his follow up appointment with Dr. Arbutus Ped on 02/25/12.    VTE: SCD    LOS: 2 days   Eddie [name] 02/16/2012, 8:49 AM

## 2012-02-17 LAB — CBC
HCT: 23.7 % — ABNORMAL LOW (ref 39.0–52.0)
Hemoglobin: 7.3 g/dL — ABNORMAL LOW (ref 13.0–17.0)
MCHC: 30.8 g/dL (ref 30.0–36.0)
MCV: 78 fL (ref 78.0–100.0)

## 2012-02-17 LAB — GLUCOSE, CAPILLARY: Glucose-Capillary: 235 mg/dL — ABNORMAL HIGH (ref 70–99)

## 2012-02-17 MED ORDER — FERROUS SULFATE 325 (65 FE) MG PO TABS: 325.0000 mg | ORAL_TABLET | Freq: Two times a day (BID) | ORAL | Status: DC

## 2012-02-17 MED ORDER — OMEPRAZOLE 40 MG PO CPDR: 40.0000 mg | DELAYED_RELEASE_CAPSULE | Freq: Every day | ORAL | Status: DC

## 2012-02-17 MED ORDER — NADOLOL 40 MG PO TABS: 40.0000 mg | ORAL_TABLET | Freq: Every day | ORAL | Status: DC

## 2012-02-17 MED ORDER — FLUCONAZOLE 100 MG PO TABS: 100.0000 mg | ORAL_TABLET | Freq: Every day | ORAL | Status: AC

## 2012-02-17 NOTE — Progress Notes (Signed)
Subjective: Feeling good today.  Denies any dizziness on standing or DOE.  Appreciative of the chance to visit his mother who is a patient in the hospital as well.  Denies any more black BMs stating that his BMs here have been normal brown color with no blood streaks in them.    Objective: Vital signs in last 24 hours: Filed Vitals:   02/16/12 1257 02/16/12 1744 02/16/12 2124 02/17/12 0550  BP: 128/97 130/64 128/75 127/73  Pulse: 67 66 61 62  Temp: 97.4 F (36.3 C) 98.1 F (36.7 C) 98.1 F (36.7 C) 98.5 F (36.9 C)  TempSrc: Oral Oral Oral Oral  Resp: 18 18 18 18   Height:      Weight:    184 lb 8.4 oz (83.7 kg)  SpO2: 100% 95% 99% 98%   Weight change:   Intake/Output Summary (Last 24 hours) at 02/17/12 0914 Last data filed at 02/17/12 0700  Gross per 24 hour  Intake   1020 ml  Output      0 ml  Net   1020 ml   Physical Exam: Vitals reviewed. General: resting in bed, NAD HEENT: PERRL, EOMI, no scleral icterus Cardiac: RRR, no rubs, murmurs or gallops Pulm: clear to auscultation bilaterally, no wheezes, rales, or rhonchi Abd: soft, nontender, nondistended, BS present Ext: warm and well perfused, no pedal edema Neuro: alert and oriented X3, cranial nerves II-XII grossly intact, strength and sensation to light touch equal in bilateral upper and lower extremities  Lab Results: Basic Metabolic Panel:  Lab 02/16/12 0454 02/14/12 2309  NA 138 130*  K 4.3 5.3*  CL 110 98  CO2 24 20  GLUCOSE 126* 337*  BUN 12 16  CREATININE 0.91 0.98  CALCIUM 9.3 9.5  MG -- --  PHOS -- --   Liver Function Tests:  Lab 02/14/12 2309 02/14/12 1155  AST 99* 47*  ALT 46 37  ALKPHOS 119* 117  BILITOT 0.5 0.7  PROT 7.5 6.6  ALBUMIN 3.5 3.7   CBC:  Lab 02/17/12 0318 02/16/12 1633 02/14/12 2309 02/14/12 1155  WBC 2.4* 2.6* -- --  NEUTROABS -- -- 1.6* 1.6*  HGB 7.3* 7.8* -- --  HCT 23.7* 25.8* -- --  MCV 78.0 77.9* -- --  PLT 61* 54* -- --   Cardiac Enzymes:  Lab 02/15/12 2021    CKTOTAL 289*  CKMB 4.9*  CKMBINDEX --  TROPONINI <0.30   CBG:  Lab 02/16/12 1646 02/16/12 1138 02/16/12 0746 02/15/12 2103 02/15/12 1750 02/15/12 1209  GLUCAP 249* 141* 121* 268* 141* 250*   Hemoglobin A1C:  Lab 02/14/12 1155  HGBA1C 10.8*   Fasting Lipid Panel:  Lab 02/14/12 1155  CHOL 140  HDL 47  LDLCALC 70  TRIG 116  CHOLHDL 3.0  LDLDIRECT --   Coagulation:  Lab 02/14/12 2309  LABPROT 25.7*  INR 2.30*   Anemia Panel:  Lab 02/16/12 0918  VITAMINB12 1223*  FOLATE 19.9  FERRITIN 14*  TIBC 408  IRON 22*  RETICCTPCT 1.2   Urine Drug Screen: Drugs of Abuse     Component Value Date/Time   LABOPIA NONE DETECTED 02/05/2011 2003   COCAINSCRNUR NONE DETECTED 02/05/2011 2003   LABBENZ NONE DETECTED 02/05/2011 2003   AMPHETMU NONE DETECTED 02/05/2011 2003   THCU NONE DETECTED 02/05/2011 2003   LABBARB  Value: NONE DETECTED        DRUG SCREEN FOR MEDICAL PURPOSES ONLY.  IF CONFIRMATION IS NEEDED FOR ANY PURPOSE, NOTIFY LAB WITHIN 5 DAYS.  LOWEST DETECTABLE LIMITS FOR URINE DRUG SCREEN Drug Class       Cutoff (ng/mL) Amphetamine      1000 Barbiturate      200 Benzodiazepine   200 Tricyclics       300 Opiates          300 Cocaine          300 THC              50 02/05/2011 2003    Medications: I have reviewed the patient's current medications. Scheduled Meds:   . fluconazole  100 mg Oral Daily  . folic acid  1 mg Oral Daily  . insulin aspart  0-9 Units Subcutaneous TID WC  . insulin glargine  20 Units Subcutaneous QHS  . lisinopril  5 mg Oral Daily  . nadolol  40 mg Oral Daily  . pantoprazole  40 mg Oral Q1200  . sodium chloride  3 mL Intravenous Q12H   Continuous Infusions:  PRN Meds:.sodium chloride, acetaminophen, acetaminophen, sodium chloride, zolpidem  Assessment/Plan: 1. Iron deficiency anemia secondary to chronic GI blood loss: Patient presents with hemoglobin of 6.0- 6.4 (repeated and verified), his hemoglobin was 9.0 in December 2012.  One month history  of dark, tarry stools concerning for upper GI bleed. The bleed does seem to have remitted, as since admission his stools have been brown and FOBT was negative in ED. The rapidity of his Hb drop, history of dark stools, and low MCV points to blood loss and Fe deficiency as cause of microcytic anemia, although his is pancytopenic at baseline w Hb of 9. He has remained hemodynamically stable and has gotten 2 units of pRBCs during this hospitalization.  Anemia panel was done after transfusion and shows a Ferritin of 14 and % Sat of 5 which indicates iron deficiency.  I reviewed his bone marrow biopsy and iron stains indicated a decreased iron stores with no ringed sideroblasts.  He has not been on oral iron as an outpatient.   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 GI performed EGD and noted portal gastropathy in the antrum, varices, small ulcer in the body of the stomach, and exudate in the proximal esophagus. They suggested PPI, Nadolol for portal hypertension, diflucan for candida esophagitis as well as considering stopping warfarin and ASA long term because of the risk of rebleed. His hemoglobin has been stable at 7.4 since transfusion and he is without symptoms.   - Plan to discontinue Coumadin and ASA per discussion with Dr. Darnelle Catalan.  - Oral Iron supplementation BID for now and follow as an outpatient.  He may need IV iron at sometime in the future if he does not absorb iron well.   2. Type 2 DM: Pt's HbA1c is 10.8 and his is on lantus 25 units qhs on home. His diabetes is under poor control and he will likely require reevaluation of med regimen for tighter control as outpt. Better control since admission with [facility] though he has only received 6 units total since admission in [facility].  -Lantus 20 U qhs and SSi while inpatient. Will discharge on his home dose of lantus and close follow up in the clinic.   3.  Hyperkalemia: Potassium was 5.3 in ED w no EKG changes but has resolved the morning after with no intervention.   4. Elevated Liver Function: Modest elevations in AST and Alk Phos for > 15yr.  Presumed likely 2/2 HCV or chronic EtOH liver disease. Pt's albumin is 3.5, TBili is 0.5 and INR 2.3 on coumadin which is reassuring for preserved liver function. He is followed by Dr. Marina Goodell of GI.   5. Pancytopenia: Evaluated and monitored by his Oncologist Dr. Arbutus Ped who indicated "The patient underwent a bone marrow biopsy and aspirate on 11/09/2011 which showed no significant abnormality to explain his pancytopenia. And this most likely secondary to liver cirrhosis and hepatitis"  flow cytometry for CD55 and CD59 is negative for PNH on 12/24/11.  - continue to follow up with oncologist.   6. HTN: Pt is normotensive to hypertensive on admission w BP max 152/83. With GI's recommendation of a non-selective beta blocker we will d/c his coreg and start Nadolol at 40 mg daily and titrate to effect.  Blood pressure after starting Nadolol has been well controlled.  - Will continue home BP medications of Nadolol and Lisinopril   7. Chronic anticoagulation: Pt is on lifelong warfarin due to history of mesenteric vein thrombosis as well as a history of portal vein thrombosis with INR being followed by his hemo-oncologist Dr. Arbutus Ped, INR today is 2.3. GI recommended stopping coumadin because of the risk of rebleeding. I discussed the case with Dr. Darnelle Catalan who states that it appears from their records that the clot was associated with the Colon cancer that was diagnosed at [facility] and he has had surgery to remove the cancer that was likely the cause of the clot. He would have personally not continued the coumadin beyond 6 months which we are coming up on (appears he was started in 09/2011) and that he would feel comfortable stopping it with the risk of GI bleeding at this time. He will inform Dr. Arbutus Ped and the patient has his  follow up appointment with Dr. Arbutus Ped on 02/25/12.   8. Candidal Esophagitis: This was noted on EGD.  We started Diflucan yesterday and he will need to continue for 14 days total.    9. Dispo:  Plan to discharge today with follow up in the [facility].  Per GI's recommendation we will suggest an abdominal ultrasound as an outpatient for surveillance for [facility].    LOS: 3 days   Eddie [name] 02/17/2012, 9:14 AM

## 2012-02-17 NOTE — Discharge Instructions (Signed)
1.  Stop Coumadin and Aspirin  2. Stop Coreg, Start Nadolol 40 mg tablets take 1 tablet daily for your blood pressure  3. Start Fluconazole 100 mg tablets.  Take 1 tablet daily for the next 12 days.  4. Make sure you are taking your Omeprazole 40 mg every day for your stomach.  5.  Start Ferrous sulfate 325 mg tablets take 1 tablet twice daily for your iron.  6.  Please call if you notice any more dark, black stools.

## 2012-02-17 NOTE — Discharge Summary (Signed)
Internal Medicine Teaching [facility] Discharge Note  Name: Eddie [name] MRN: [id number] DOB: 20-Apr-1941 71 y.o.  Date of Admission: 02/14/2012 10:45 PM Date of Discharge: 02/17/2012 Attending Physician: Rocco Serene, MD  Discharge Diagnosis: 1. Iron deficiency anemia secondary to chronic GI blood loss 2. DM II 3. Portal hypertensive gastropathy 4. Esophageal varices in cirrhosis 5. Hyperkalemia 6. Elevated LFTs 7. Pancytopenia 8. HTN 9. Chronic anticoagulation 10. Candidal Esophagitis 11. HCV 12. Cirrhosis 13. Hx of Mesenteric vein thrombosis 14. Hx of Portal vein thrombosis 15. Hx of Colon cancer s/p hemicolectomy 16. Tonsillar fossa malignancy 17. Adenocarcinoma of the prostate  Discharge Medications: Medication List  As of 02/17/2012  9:48 AM   STOP taking these medications         aspirin 81 MG EC tablet      carvedilol 3.125 MG tablet      warfarin 5 MG tablet         TAKE these medications         ferrous sulfate 325 (65 FE) MG tablet   Take 1 tablet (325 mg total) by mouth 2 (two) times daily.      fluconazole 100 MG tablet   Commonly known as: DIFLUCAN   Take 1 tablet (100 mg total) by mouth daily.      folic acid 1 MG tablet   Commonly known as: FOLVITE   Take 1 tablet (1 mg total) by mouth daily.      insulin glargine 100 UNIT/ML injection   Commonly known as: LANTUS   Inject 25 units into the skin subcutaneously at night.      lisinopril 5 MG tablet   Commonly known as: PRINIVIL,ZESTRIL   Take 1 tablet (5 mg total) by mouth daily.      multivitamin tablet   Take 1 tablet by mouth daily.      nadolol 40 MG tablet   Commonly known as: CORGARD   Take 1 tablet (40 mg total) by mouth daily.      neomycin-bacitracin-polymyxin ointment   Commonly known as: NEOSPORIN   Apply topically every 12 (twelve) hours. apply to the affected area as directed.      omeprazole 40 MG capsule   Commonly known as: PRILOSEC   Take 1 capsule (40  mg total) by mouth daily.           Disposition and follow-up:   Eddie [name] was discharged from [facility] in Good condition.  He will be called for a follow up appointment in the [facility] in 1-2 weeks.  At that visit he should have a CBC checked to ensure his hemoglobin remains stable and to be assessed for any further GI bleeding.  His blood pressure medications were altered during his stay so titration may also be warranted.  He also needs intensification of his diabetes regimen for better control.  GI also suggested that he be scheduled for an abdominal ultrasound for surveillance for [facility] with his history of cirrhosis and HCV.    Follow-up Appointments: Follow-up Information    Follow up with [name], MD. (We will call with an appointment in 1-2 weeks. )         Discharge Orders    Future Appointments: Provider: Department: Dept Phone: Center:   02/21/2012 10:30 AM Krista Blue [facility] [phone] None   02/21/2012 10:45 AM [facility] [phone] None   02/21/2012 2:00 PM [facility]  None  02/25/2012 10:30 AM Gwenith Spitz [facility] [phone] None   02/25/2012 11:00 AM Mohamed K. Mohamed, MD [facility] [phone] None     Future Orders Please Complete By Expires   Diet - low sodium heart healthy      Increase activity slowly      Discharge instructions      Comments:   1.  Stop Coumadin and Aspirin  2. Stop Coreg, Start Nadolol 40 mg tablets take 1 tablet daily for your blood pressure  3. Start Fluconazole 100 mg tablets.  Take 1 tablet daily for the next 12 days.  4. Make sure you are taking your Omeprazole 40 mg every day for your stomach.  5.  Start Ferrous sulfate 325 mg tablets take 1 tablet twice daily for your iron.  6.  Please call if you notice any more dark, black stools.   Call MD for:  extreme fatigue      Call MD for:  persistant dizziness or  light-headedness        Consultations: Treatment Team:  Eliezer Bottom., MD,FACG  Procedures Performed:  EGD: Gastropathy was found in the antrum. It was friable, erythematous  and oozing blood which stoppd spontaneously. An ulcer was found  in the body of the stomach. It was linear with marked erythem and  clean based. It was 5 mm in size. Otherwise normal stomach. Grade  II varices were found in the distal esophagus. White. yellow  exudates in the proximal esophagus. Otherwise normal esophagus.  The duodenal bulb was normal in appearance, as was the postbulbar  duodenum. Retroflexed views revealed gastric varices and a hiatal  hernia. The scope was then withdrawn from the patient and the  procedure completed.  ENDOSCOPIC IMPRESSION:  1) Portal gastropathy in the antrum  2) Gastric varices in the cardia  3) 5 mm ulcer in the body of the stomach  4) Grade II varices in the distal esophagus  5) Exudate in the proximal esophagus  6) Small hiatal hernia   RECOMMENDATIONS:  1) Avoid ASA/NSAIDs  2) PPI qam long term  3) Naldolol for portal hypertension if no contraindications from  primary service  4) Hold Coumadin and consider discontinuing long term as this will  raise the risk of rebleeding from gastopathy  4) Diflucan for candida esophagitis  Admission HPI: This is a 71 year old male with PMH of Upper GIB, HCV, alcohol abuse, DMII, 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 Patient states that he has had averagely one black stool daily for one month, which first presented with soft and formed, and later changed to loose/watery, no foul smell. Patient states that he feels fine otherwise and actually has one brown stool today. Patient came to [facility] for his routine follow up visit on 02/14/12 and was found to have HGB of 6.0 with last HGB 9.0 in December of 2012.  Patient was brought in for further evaluation.  Denies nausea, vomiting, abdominal pain or diarrhea. Denies any bright red blood in stool. Denies dizziness or lightheadedness.  No headache, fever, or sore throat. No shortness of breath or dyspnea on exertion. No chest pain, chest pressure or palpitation Denies dysuria, urinary frequency or urgency.   Hospital Course by problem list: 1. Iron deficiency anemia secondary to chronic GI blood loss: Patient presents with hemoglobin of 6.0- 6.4 (repeated and verified). His hemoglobin was 9.0 in  December 2012. He reported a one month history of dark, tarry stools that was concerning for upper GI bleed. The bleed does seem to have remitted, as since admission his stools have been brown and FOBT was negative in ED. The rapidity of his Hb drop, history of dark stools, and low MCV points to blood loss and Fe deficiency as cause of microcytic anemia.  He has a history of pancytopenic and at baseline has a hemoglobin of 9. He remained hemodynamically stable throughout admission and received  2 units of pRBCs which increased his HgB to 7.8.  Anemia panel was done after transfusion and shows a Ferritin of 14 and % Sat of 5 which indicates iron deficiency. His bone marrow biopsy was reviewed and iron stains indicated a decreased iron stores with no ringed sideroblasts. He has not been on oral iron as an outpatient. GI was consulted and performed and EGD which noted portal gastropathy in the antrum, varices, small ulcer in the body of the stomach, and exudate in the proximal esophagus. They suggested PPI, Nadolol for portal hypertension, diflucan for candida esophagitis as well as considering stopping warfarin and ASA long term because of the risk of rebleed.  He was on chronic anticoagulation for history of mesenteric vein thrombosis 2/2 malignancy, see below for discussion of anticoagulation.  He was started on oral iron supplementation at discharge and will need his  hemoglobin followed at his next appointment.   2. Chronic anticoagulation: Pt is on lifelong warfarin due to history of mesenteric vein thrombosis as well as a history of portal vein thrombosis with INR being followed by his hemo-oncologist Dr. Arbutus Ped, INR on the date of admission was 2.3.  Following EGD, GI recommended stopping coumadin because of the risk of rebleeding. I discussed the case with Dr. Darnelle Catalan who states that it appears from their records that the clot was associated with the Colon cancer that was diagnosed at [facility] and he has had surgery to remove the cancer that was likely the cause of the clot. He would have personally not continued the coumadin beyond 6 months which we are coming up on (appears he was started in 09/2011) and that he would feel comfortable stopping it with the risk of GI bleeding at this time. He will inform Dr. Arbutus Ped and the patient has his follow up appointment with Dr. Arbutus Ped on 02/25/12.   3. Type 2 DM: Pt's HbA1c on admission was 10.8 and he is on lantus 25 units qhs on home. His diabetes has been under poor control recently and he will likely require reevaluation of med regimen for tighter control as outpatient.  During admission he was placed on 20 units of Lantus and SSI and only received 6 units total throughout admission of the SSI.  He will be discharged home on his home dose of lantus and will need close follow up with his PCP to bring this under control.   4. Hyperkalemia: Potassium was 5.3 in ED with no EKG changes but has resolved the morning after with no intervention. He did not require any interventions for his potassium throughout his stay.   5. Elevated Liver Function: Modest elevations in AST and Alk Phos for > 51yr. Presumed likely 2/2 HCV or chronic EtOH liver disease. Pt's albumin is 3.5, TBili is 0.5 and INR 2.3 on coumadin which is reassuring for preserved liver function. He is followed by Dr. Marina Goodell of GI.   6. Pancytopenia: Evaluated and  monitored by his Oncologist Dr. Arbutus Ped who  indicated "The patient underwent a bone marrow biopsy and aspirate on 11/09/2011 which showed no significant abnormality to explain his pancytopenia. And this most likely secondary to liver cirrhosis and hepatitis."  He also underwent flow cytometry for CD55 and CD59 is negative for PNH on 12/24/11.  His bone marrow biopsy also indicated decreased iron stores which we will replace with oral iron supplementation as an outpatient.   7. HTN: Pt is normotensive to hypertensive on admission w BP max 152/83. With GI's recommendation of a non-selective beta blocker we discontinued his coreg and start Nadolol at 40 mg daily.  Blood pressure after starting Nadolol has been well controlled but will need following as an outpatient to ensure he continues to be well controlled.    8. Candidal Esophagitis: This was noted on EGD. We started Diflucan the date after the EGD and he will need to continue for 14 days total.   Discharge Vitals:  BP 132/77  Pulse 62  Temp(Src) 98 F (36.7 C) (Oral)  Resp 18  Ht 6' (1.829 m)  Wt 184 lb 8.4 oz (83.7 kg)  BMI 25.03 kg/m2  SpO2 100%  Discharge Labs:  Results for orders placed during the hospital encounter of 02/14/12 (from the past 24 hour(s))  GLUCOSE, CAPILLARY     Status: Abnormal   Collection Time   02/16/12 11:38 AM      Component Value Range   Glucose-Capillary 141 (*) 70 - 99 (mg/dL)  CBC     Status: Abnormal   Collection Time   02/16/12  4:33 PM      Component Value Range   WBC 2.6 (*) 4.0 - 10.5 (K/uL)   RBC 3.31 (*) 4.22 - 5.81 (MIL/uL)   Hemoglobin 7.8 (*) 13.0 - 17.0 (g/dL)   HCT 16.1 (*) 09.6 - 52.0 (%)   MCV 77.9 (*) 78.0 - 100.0 (fL)   MCH 23.6 (*) 26.0 - 34.0 (pg)   MCHC 30.2  30.0 - 36.0 (g/dL)   RDW 04.5 (*) 40.9 - 15.5 (%)   Platelets 54 (*) 150 - 400 (K/uL)  GLUCOSE, CAPILLARY     Status: Abnormal   Collection Time   02/16/12  4:46 PM      Component Value Range   Glucose-Capillary 249 (*) 70  - 99 (mg/dL)  CBC     Status: Abnormal   Collection Time   02/17/12  3:18 AM      Component Value Range   WBC 2.4 (*) 4.0 - 10.5 (K/uL)   RBC 3.04 (*) 4.22 - 5.81 (MIL/uL)   Hemoglobin 7.3 (*) 13.0 - 17.0 (g/dL)   HCT 81.1 (*) 91.4 - 52.0 (%)   MCV 78.0  78.0 - 100.0 (fL)   MCH 24.0 (*) 26.0 - 34.0 (pg)   MCHC 30.8  30.0 - 36.0 (g/dL)   RDW 78.2 (*) 95.6 - 15.5 (%)   Platelets 61 (*) 150 - 400 (K/uL)   Signed: Sharifah Champine 02/17/2012, 9:48 AM

## 2012-02-18 ENCOUNTER — Encounter (HOSPITAL_COMMUNITY): Payer: Self-pay | Admitting: Gastroenterology

## 2012-02-18 LAB — GLUCOSE, CAPILLARY: Glucose-Capillary: 276 mg/dL — ABNORMAL HIGH (ref 70–99)

## 2012-02-19 ENCOUNTER — Other Ambulatory Visit: Payer: Self-pay | Admitting: Pharmacist

## 2012-02-19 DIAGNOSIS — I81 Portal vein thrombosis: Secondary | ICD-10-CM

## 2012-02-21 ENCOUNTER — Telehealth: Payer: Self-pay | Admitting: Pharmacist

## 2012-02-21 ENCOUNTER — Other Ambulatory Visit: Payer: Medicare Other | Admitting: Lab

## 2012-02-21 ENCOUNTER — Other Ambulatory Visit (HOSPITAL_COMMUNITY): Payer: Medicare Other

## 2012-02-21 ENCOUNTER — Ambulatory Visit: Payer: Medicare Other

## 2012-02-21 NOTE — Telephone Encounter (Signed)
spoke with patient. just released from [facility] with gi bleed. HGb down to 6. was transfused. instructed not to take coumadin and fu with Dr. Arbutus Ped. appmt set for Mon 3/25   By Daylene Katayama, PHARMD

## 2012-02-21 NOTE — Telephone Encounter (Signed)
Spoke with patient to let him know that MD is aware he is off coumadin since hospital discharge.  He may be off coumadin for good, but it will be addressed in his appmt on Monday with MD

## 2012-02-25 ENCOUNTER — Other Ambulatory Visit (HOSPITAL_BASED_OUTPATIENT_CLINIC_OR_DEPARTMENT_OTHER): Payer: Medicare Other | Admitting: Lab

## 2012-02-25 ENCOUNTER — Ambulatory Visit (HOSPITAL_BASED_OUTPATIENT_CLINIC_OR_DEPARTMENT_OTHER): Payer: Medicare Other | Admitting: Internal Medicine

## 2012-02-25 DIAGNOSIS — B1710 Acute hepatitis C without hepatic coma: Secondary | ICD-10-CM

## 2012-02-25 DIAGNOSIS — C184 Malignant neoplasm of transverse colon: Secondary | ICD-10-CM

## 2012-02-25 DIAGNOSIS — C090 Malignant neoplasm of tonsillar fossa: Secondary | ICD-10-CM

## 2012-02-25 DIAGNOSIS — D595 Paroxysmal nocturnal hemoglobinuria [Marchiafava-Micheli]: Secondary | ICD-10-CM

## 2012-02-25 DIAGNOSIS — C61 Malignant neoplasm of prostate: Secondary | ICD-10-CM

## 2012-02-25 LAB — CBC WITH DIFFERENTIAL/PLATELET
BASO%: 0.3 % (ref 0.0–2.0)
Eosinophils Absolute: 0.1 10*3/uL (ref 0.0–0.5)
MCHC: 31.4 g/dL — ABNORMAL LOW (ref 32.0–36.0)
MONO#: 0.4 10*3/uL (ref 0.1–0.9)
NEUT#: 2.6 10*3/uL (ref 1.5–6.5)
RBC: 3.57 10*6/uL — ABNORMAL LOW (ref 4.20–5.82)
RDW: 21.5 % — ABNORMAL HIGH (ref 11.0–14.6)
WBC: 3.9 10*3/uL — ABNORMAL LOW (ref 4.0–10.3)
lymph#: 0.8 10*3/uL — ABNORMAL LOW (ref 0.9–3.3)
nRBC: 0 % (ref 0–0)

## 2012-02-25 LAB — COMPREHENSIVE METABOLIC PANEL
ALT: 35 U/L (ref 0–53)
AST: 44 U/L — ABNORMAL HIGH (ref 0–37)
CO2: 24 mEq/L (ref 19–32)
Calcium: 9.6 mg/dL (ref 8.4–10.5)
Chloride: 101 mEq/L (ref 96–112)
Sodium: 132 mEq/L — ABNORMAL LOW (ref 135–145)
Total Protein: 6.7 g/dL (ref 6.0–8.3)

## 2012-02-25 NOTE — Progress Notes (Signed)
[facility] [phone]   [phone]  OFFICE PROGRESS NOTE  Eddie Harder, MD, MD [address] [place] [address]  DIAGNOSIS:  :  1. 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 2. 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 3. History of colon adenocarcinoma diagnosed in September of 2010 at [facility]. 4. History of hepatitis and liver cirrhosis. 5. History of gastrointestinal bleed. 6. Pancytopenia. 7. Mesenteric vein thrombosis.  PRIOR THERAPY:  1. Status post concurrent chemoradiation with weekly cisplatin for tonsillar squamous cell carcinoma, last dose was given March 22, 2008.  2. Status post curative radiotherapy with IMRT and androgen ablation for the prostate adenocarcinoma, completed December 10, 2008. 3. Status post left hemicolectomy at [facility] in November of 2010 revealing T3 N0 M0 colon adenocarcinoma and the patient did not receive adjuvant chemotherapy.  CURRENT THERAPY: Coumadin 5 mg by mouth as directed. 5mg  daily except 7.5mg  on Tu &Thu    INTERVAL HISTORY: Eddie [name] 71 y.o. male returns to the clinic today for followup visit. The patient was recently admitted to [facility] with GI bleed. His Coumadin was discontinued at that time. He is also currently off aspirin. The patient is feeling much better today. He denied having any significant chest pain or shortness of breath, , no cough or hemoptysis. No nausea or vomiting. He is here today for hospital followup visit.  MEDICAL HISTORY: Past Medical History  Diagnosis Date  . Cancer     Status post curative radiotherapy with IMRT and androgen ablation for the prostate adenocarcinoma, completed December 10, 2008.  Marland Kitchen Cancer of colon     Status post left hemicolectomy at [facility] in November of 2010 revealing T3 N0 M0 colon  adenocarcinoma and the patient did not receive adjuvant chemotherapy  . 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  Marland Kitchen Upper GI bleeding     EGD on 01/2011 shows esophagitis, grade 1 varices  . Hypertension   . Cirrhosis   . Anemia     bone marrow biopsy 11/2011,   . Diabetes mellitus   . Hepatitis C   . Gallstone   . Thrush, oral   . GERD (gastroesophageal reflux disease)   . DM gastroparesis   . Gastritis   . Hernia   . Pancreatitis   . Pancytopenia     ALLERGIES:   has no known allergies.  MEDICATIONS:  Current Outpatient Prescriptions  Medication Sig Dispense Refill  . ferrous sulfate 325 (65 FE) MG tablet Take 1 tablet (325 mg total) by mouth 2 (two) times daily.  30 tablet  11  . folic acid (FOLVITE) 1 MG tablet Take 1 tablet (1 mg total) by mouth daily.  100 tablet  3  . insulin glargine (LANTUS) 100 UNIT/ML injection Inject 25 units into the skin subcutaneously at night.  10 mL  11  . lisinopril (PRINIVIL,ZESTRIL) 5 MG tablet Take 1 tablet (5 mg total) by mouth daily.  30 tablet  2  . Multiple Vitamin (MULTIVITAMIN) tablet Take 1 tablet by mouth daily.  30 tablet  3  . nadolol (CORGARD) 40 MG tablet Take 1 tablet (40 mg total) by mouth daily.  30 tablet  1  . neomycin-bacitracin-polymyxin (NEOSPORIN) ointment Apply topically every  12 (twelve) hours. apply to the affected area as directed.  15 g  0  . omeprazole (PRILOSEC) 40 MG capsule Take 1 capsule (40 mg total) by mouth daily.  30 capsule  11  . fluconazole (DIFLUCAN) 100 MG tablet Take 1 tablet (100 mg total) by mouth daily.  12 tablet  0    SURGICAL HISTORY:  Past Surgical History  Procedure Date  . Colon surgery   . Hernia repair   . Esophagogastroduodenoscopy 02/15/2012    Procedure: ESOPHAGOGASTRODUODENOSCOPY (EGD);  Surgeon: Eliezer Bottom., MD,FACG;  Location: [facility] ENDOSCOPY;  Service: Endoscopy;   Laterality: N/A;    REVIEW OF SYSTEMS:  A comprehensive review of systems was negative except for: Constitutional: positive for fatigue   PHYSICAL EXAMINATION: General appearance: alert, cooperative and no distress Neck: no adenopathy Lymph nodes: Cervical, supraclavicular, and axillary nodes normal. Resp: clear to auscultation bilaterally Cardio: regular rate and rhythm, S1, S2 normal, no murmur, click, rub or gallop GI: soft, non-tender; bowel sounds normal; no masses,  no organomegaly Extremities: extremities normal, atraumatic, no cyanosis or edema Neurologic: Alert and oriented X 3, normal strength and tone. Normal symmetric reflexes. Normal coordination and gait  ECOG PERFORMANCE STATUS: 1 - Symptomatic but completely ambulatory  Blood pressure 119/72, pulse 62, temperature 97.7 F (36.5 C), temperature source Oral, height 6' (1.829 m), weight 178 lb 14.4 oz (81.149 kg).  LABORATORY DATA: Lab Results  Component Value Date   WBC 3.9* 02/25/2012   HGB 9.0* 02/25/2012   HCT 28.6* 02/25/2012   MCV 82.1 02/25/2012   PLT 60* 02/25/2012      Chemistry      Component Value Date/Time   NA 132* 02/25/2012 1032   NA 142 09/17/2011 1030   K 4.8 02/25/2012 1032   K 4.2 09/17/2011 1030   CL 101 02/25/2012 1032   CL 106 09/17/2011 1030   CO2 24 02/25/2012 1032   CO2 26 09/17/2011 1030   BUN 11 02/25/2012 1032   BUN 12 09/17/2011 1030   CREATININE 1.23 02/25/2012 1032   CREATININE 1.16 02/14/2012 1155      Component Value Date/Time   CALCIUM 9.6 02/25/2012 1032   CALCIUM 9.5 09/17/2011 1030   ALKPHOS 115 02/25/2012 1032   ALKPHOS 105* 09/17/2011 1030   AST 44* 02/25/2012 1032   AST 66* 09/17/2011 1030   ALT 35 02/25/2012 1032   BILITOT 0.8 02/25/2012 1032   BILITOT 1.10 09/17/2011 1030       RADIOGRAPHIC STUDIES: No results found.  ASSESSMENT: This is a very pleasant 71 years old [place] American man with multiple medical problems including a stage III tonsillar carcinoma, histologic  prostate adenocarcinoma as well as history of colon cancer in addition to pancytopenia and recent GI bleed. He was on Coumadin for mesenteric vein thrombosis which was discontinued recently secondary to his GI bleed.  PLAN: The patient is doing fine today. I agree with discontinuing the Coumadin. I would see him back for followup visit in 3 months with repeat CT scan of the neck, chest abdomen and pelvis for restaging of his disease. He was advised to call me immediately if he has any concerning symptoms in the interval.   All questions were answered. The patient knows to call the clinic with any problems, questions or concerns. We can certainly see the patient much sooner if necessary.

## 2012-02-28 ENCOUNTER — Encounter: Payer: Medicare Other | Admitting: Internal Medicine

## 2012-02-28 ENCOUNTER — Telehealth: Payer: Self-pay | Admitting: Internal Medicine

## 2012-02-28 NOTE — Telephone Encounter (Signed)
S/w pt today re appts for lb/ct 5/24 and f/u 5/29. Pt given instructions and thinks he has prep at home. Pt will get prep b4 5/24 if he does not already have some.

## 2012-03-05 ENCOUNTER — Encounter: Payer: Medicare Other | Admitting: Internal Medicine

## 2012-03-07 ENCOUNTER — Encounter: Payer: Medicare Other | Admitting: Internal Medicine

## 2012-03-12 ENCOUNTER — Other Ambulatory Visit (HOSPITAL_COMMUNITY): Payer: Self-pay | Admitting: Internal Medicine

## 2012-03-18 ENCOUNTER — Emergency Department (HOSPITAL_COMMUNITY)
Admission: EM | Admit: 2012-03-18 | Discharge: 2012-03-18 | Disposition: A | Discharge status: Home / Self Care | Payer: Medicare Other | Attending: Emergency Medicine | Admitting: Emergency Medicine

## 2012-03-18 ENCOUNTER — Encounter (HOSPITAL_COMMUNITY): Payer: Self-pay | Admitting: Emergency Medicine

## 2012-03-18 DIAGNOSIS — Z79899 Other long term (current) drug therapy: Secondary | ICD-10-CM | POA: Insufficient documentation

## 2012-03-18 DIAGNOSIS — E1149 Type 2 diabetes mellitus with other diabetic neurological complication: Secondary | ICD-10-CM | POA: Insufficient documentation

## 2012-03-18 DIAGNOSIS — Z794 Long term (current) use of insulin: Secondary | ICD-10-CM | POA: Insufficient documentation

## 2012-03-18 DIAGNOSIS — K429 Umbilical hernia without obstruction or gangrene: Secondary | ICD-10-CM | POA: Insufficient documentation

## 2012-03-18 DIAGNOSIS — R739 Hyperglycemia, unspecified: Secondary | ICD-10-CM

## 2012-03-18 DIAGNOSIS — Z85038 Personal history of other malignant neoplasm of large intestine: Secondary | ICD-10-CM | POA: Insufficient documentation

## 2012-03-18 DIAGNOSIS — B1920 Unspecified viral hepatitis C without hepatic coma: Secondary | ICD-10-CM | POA: Insufficient documentation

## 2012-03-18 DIAGNOSIS — D649 Anemia, unspecified: Secondary | ICD-10-CM | POA: Insufficient documentation

## 2012-03-18 DIAGNOSIS — K3184 Gastroparesis: Secondary | ICD-10-CM | POA: Insufficient documentation

## 2012-03-18 DIAGNOSIS — I10 Essential (primary) hypertension: Secondary | ICD-10-CM | POA: Insufficient documentation

## 2012-03-18 LAB — CBC
MCHC: 32 g/dL (ref 30.0–36.0)
MCV: 86.4 fL (ref 78.0–100.0)
Platelets: 55 10*3/uL — ABNORMAL LOW (ref 150–400)
RDW: 23.5 % — ABNORMAL HIGH (ref 11.5–15.5)
WBC: 2.9 10*3/uL — ABNORMAL LOW (ref 4.0–10.5)

## 2012-03-18 LAB — POCT I-STAT, CHEM 8
Calcium, Ion: 1.28 mmol/L (ref 1.12–1.32)
Chloride: 101 mEq/L (ref 96–112)
Glucose, Bld: 310 mg/dL — ABNORMAL HIGH (ref 70–99)
HCT: 38 % — ABNORMAL LOW (ref 39.0–52.0)
Hemoglobin: 12.9 g/dL — ABNORMAL LOW (ref 13.0–17.0)
Potassium: 4.6 mEq/L (ref 3.5–5.1)

## 2012-03-18 LAB — GLUCOSE, CAPILLARY: Glucose-Capillary: 322 mg/dL — ABNORMAL HIGH (ref 70–99)

## 2012-03-18 MED ORDER — INSULIN ASPART 100 UNIT/ML ~~LOC~~ SOLN
8.0000 [IU] | Freq: Once | SUBCUTANEOUS | Status: AC
  Administered 2012-03-18: 8 [IU] via SUBCUTANEOUS
  Filled 2012-03-18: qty 1

## 2012-03-18 NOTE — Discharge Instructions (Signed)
Mr Sox your labs in the ER today were normal except for your blood sugar was 310. We gave he some insulin which brought the blood sugar down. It looks like the last visit to your primary care provider they wanted you on a sliding scale insulin. Call the office tomorrow to make an appointment or see interspace to be on a sliding scale insulin each meal. Take your Lantus when you get home tonight. Your abdominal pain seemed to be gone for the telephone your here today. As her primary care provider know you were here with abdominal pain as well. Abdominal Pain Many things can cause belly (abdominal) pain. Most times, the belly pain is not dangerous. The amount of belly pain does not tell how serious the problem may be. Many cases of belly pain can be watched and treated at home. HOME CARE   Do not take medicines that help you go poop (laxatives) unless told to by your doctor.   Only take medicine as told by your doctor.   Eat or drink as told by your doctor. Your doctor will tell you if you should be on a special diet.  GET HELP RIGHT AWAY IF:   The pain does not go away.   You have a fever.   You keep throwing up (vomiting).   The pain changes and is only in the right or left part of the belly.   You have bloody or tarry looking poop.  MAKE SURE YOU:   Understand these instructions.   Will watch your condition.   Will get help right away if you are not doing well or get worse.  Document Released: 06/04/2008 Document Revised: 12/06/2011 Document Reviewed: 01/02/2010 [facility] Patient Information 2012 [place], [place].Abdominal Pain Many things can cause belly (abdominal) pain. Most times, the belly pain is not dangerous. The amount of belly pain does not tell how serious the problem may be. Many cases of belly pain can be watched and treated at home. HOME CARE   Do not take medicines that help you go poop (laxatives) unless told to by your doctor.   Only take medicine as told by your  doctor.   Eat or drink as told by your doctor. Your doctor will tell you if you should be on a special diet.  GET HELP RIGHT AWAY IF:   The pain does not go away.   You have a fever.   You keep throwing up (vomiting).   The pain changes and is only in the right or left part of the belly.   You have bloody or tarry looking poop.  MAKE SURE YOU:   Understand these instructions.   Will watch your condition.   Will get help right away if you are not doing well or get worse.  Document Released: 06/04/2008 Document Revised: 12/06/2011 Document Reviewed: 01/02/2010 [facility] Patient Information 2012 [place], [place].

## 2012-03-18 NOTE — ED Provider Notes (Signed)
History     CSN: [id number]  Arrival date & time 03/18/12  1512   None     Chief Complaint  Patient presents with  . Abdominal Pain    (Consider location/radiation/quality/duration/timing/severity/associated sxs/prior treatment) Patient is a 71 y.o. male presenting with abdominal pain. The history is provided by the patient. No language interpreter was used.  Abdominal Pain The primary symptoms of the illness do not include abdominal pain, fever, fatigue, shortness of breath, nausea, vomiting, diarrhea or dysuria. The onset of the illness was gradual. The problem has been resolved.  The patient has not had a change in bowel habit. Risk factors for an acute abdominal problem include being elderly and a history of abdominal surgery. Symptoms associated with the illness do not include chills, diaphoresis, heartburn, constipation, urgency, hematuria, frequency or back pain. Significant associated medical issues include diabetes and liver disease.  Reports L abdominal pain x 2 days that has resolved after taking some pepto bismal.  No n/v/d.  pmh colon ca, diabetes, pancreatis,gerd, gastritis, hep c,  GI bleed, prostate ca, tonsilar ca.  Has pcp and oncologist. Recent pcp visit   Past Medical History  Diagnosis Date  . Cancer     Status post curative radiotherapy with IMRT and androgen ablation for the prostate adenocarcinoma, completed December 10, 2008.  Marland Kitchen Cancer of colon     Status post left hemicolectomy at [facility] in November of 2010 revealing T3 N0 M0 colon adenocarcinoma and the patient did not receive adjuvant chemotherapy  . 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  Marland Kitchen Upper GI bleeding     EGD on 01/2011 shows esophagitis, grade 1 varices  . Hypertension   . Cirrhosis   . Anemia     bone marrow biopsy 11/2011,   . Diabetes mellitus   . Hepatitis C   .  Gallstone   . Thrush, oral   . GERD (gastroesophageal reflux disease)   . DM gastroparesis   . Gastritis   . Hernia   . Pancreatitis   . Pancytopenia     Past Surgical History  Procedure Date  . Colon surgery   . Hernia repair   . Esophagogastroduodenoscopy 02/15/2012    Procedure: ESOPHAGOGASTRODUODENOSCOPY (EGD);  Surgeon: Eliezer Bottom., MD,FACG;  Location: [facility] ENDOSCOPY;  Service: Endoscopy;  Laterality: N/A;    Family History  Problem Relation Age of Onset  . Breast cancer Mother     mother still living and father passed with unsure reason.  . Alzheimer's disease Mother   . Healthy Sister     History  Substance Use Topics  . Smoking status: Never Smoker   . Smokeless tobacco: Never Used  . 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  Respiratory: Negative for shortness of breath.   Gastrointestinal: Negative for heartburn, nausea, vomiting, abdominal pain, diarrhea and constipation.  Genitourinary: Negative for dysuria, urgency, frequency and hematuria.  Musculoskeletal: Negative for back pain.  Neurological: Negative for dizziness, weakness and light-headedness.  Psychiatric/Behavioral: Negative.  Negative for confusion.    Allergies  Review of patient's allergies indicates no known allergies.  Home Medications   Current Outpatient Rx  Name Route Sig Dispense Refill  . FERROUS SULFATE 325 (65 FE) MG PO TABS Oral Take 1 tablet (325 mg total) by mouth 2 (two) times daily. 30 tablet 11  . FOLIC ACID 1 MG PO TABS Oral Take 1 tablet (1 mg total) by mouth daily. 100 tablet 3    WE SELL #100  FOR $10   PLEASE WRITE RX FOR #100  . INSULIN GLARGINE 100 UNIT/ML [place] SOLN  Inject 25 units into the skin subcutaneously at night. 10 mL 11  . LISINOPRIL 5 MG PO TABS Oral Take 1 tablet (5 mg  total) by mouth daily. 30 tablet 2  . ONE-DAILY MULTI VITAMINS PO TABS Oral Take 1 tablet by mouth daily. 30 tablet 3  . NADOLOL 40 MG PO TABS Oral Take 1 tablet (40 mg total) by mouth daily. 30 tablet 1  . BACITRACIN-NEOMYCIN-POLYMYXIN [id number] EX OINT Topical Apply topically every 12 (twelve) hours. apply to the affected area as directed. 15 g 0  . OMEPRAZOLE 40 MG PO CPDR Oral Take 1 capsule (40 mg total) by mouth daily. 30 capsule 11    BP 130/80  Pulse 68  Temp(Src) 98.7 F (37.1 C) (Oral)  Resp 18  SpO2 99%  Physical Exam  Nursing note and vitals reviewed. Constitutional: He is oriented to person, place, and time. He appears well-developed and well-nourished. No distress.  HENT:  Head: Normocephalic.  Eyes: Conjunctivae and EOM are normal. Pupils are equal, round, and reactive to light.  Neck: Normal range of motion. Neck supple.  Cardiovascular: Normal rate.   Pulmonary/Chest: Effort normal.  Abdominal: Soft. Bowel sounds are normal. He exhibits no distension. There is no tenderness. There is no rebound.       Umbilical hernia  Musculoskeletal: Normal range of motion.  Neurological: He is alert and oriented to person, place, and time.  Skin: Skin is warm and dry.  Psychiatric: He has a normal mood and affect.    ED Course  Procedures (including critical care time)   Labs Reviewed  CBC   No results found.   No diagnosis found.    MDM  Abd pain has resolved. Hgb stable. Hyperglycemia treated with regular insulin sq.  Will call pcp and verify if he is supposed to be getting regular insulin at meal time as well as his lantus insulin.  Last pcp note states he is supposed to be on sliding scale.  Follow up tomorrow at pcp.   Labs Reviewed  CBC - Abnormal; Notable for the following:    WBC 2.9 (*)    RBC 3.91 (*)    Hemoglobin 10.8 (*)    HCT 33.8 (*)    RDW 23.5 (*)    Platelets 55 (*) PLATELET COUNT CONFIRMED BY SMEAR   All other components within normal  limits  POCT I-STAT, CHEM 8 - Abnormal; Notable for the following:    Glucose, Bld 310 (*)    Hemoglobin 12.9 (*)    HCT 38.0 (*)    All other components within normal limits  GLUCOSE, CAPILLARY - Abnormal; Notable for the following:    Glucose-Capillary 322 (*)    All other components within normal limits  GLUCOSE, CAPILLARY - Abnormal; Notable for the following:    Glucose-Capillary 259 (*)    All other components within normal limits  LAB REPORT - SCANNED         Jethro Bastos, NP 03/19/12 1215

## 2012-03-18 NOTE — ED Notes (Signed)
Pt d/c home in NAD. Pt voiced understanding of follow-up instructions. Pt denies abd pain. States "I feel pretty good."

## 2012-03-18 NOTE — ED Notes (Signed)
Pt st's he had left mid abd pain x's 2 days. St's today he took [employer] and now pain is gone.  Denies nausea, vomiting or diarrhea. Last BM yesterday

## 2012-03-18 NOTE — ED Notes (Signed)
Pt LBM was yesterday.

## 2012-03-19 NOTE — ED Provider Notes (Signed)
Medical screening examination/treatment/procedure(s) were performed by non-physician practitioner and as supervising physician I was immediately available for consultation/collaboration.  Cheri Guppy, MD 03/19/12 [phone]

## 2012-03-25 ENCOUNTER — Telehealth: Payer: Self-pay | Admitting: Medical Oncology

## 2012-03-25 NOTE — Telephone Encounter (Signed)
He called to cancel appointment for tomorrow because his mother passed away last night . I called [place] back and told him he does not have an appointment until May. He confirmed his appointments

## 2012-04-03 ENCOUNTER — Other Ambulatory Visit (HOSPITAL_COMMUNITY): Payer: Self-pay | Admitting: Internal Medicine

## 2012-05-12 ENCOUNTER — Other Ambulatory Visit: Payer: Self-pay | Admitting: *Deleted

## 2012-05-12 DIAGNOSIS — E119 Type 2 diabetes mellitus without complications: Secondary | ICD-10-CM

## 2012-05-12 MED ORDER — LISINOPRIL 5 MG PO TABS: 5.0000 mg | ORAL_TABLET | Freq: Every day | ORAL | Status: DC

## 2012-05-19 ENCOUNTER — Telehealth: Payer: Self-pay | Admitting: Medical Oncology

## 2012-05-19 NOTE — Telephone Encounter (Signed)
Asking when and where to pick up contrast - instructions given

## 2012-05-20 ENCOUNTER — Telehealth: Payer: Self-pay | Admitting: Internal Medicine

## 2012-05-20 NOTE — Telephone Encounter (Signed)
Gave pt oral contrast and calendar for May 2013, lab, Ct and MD after a few days , NPO 4 hrs prior to CT

## 2012-05-23 ENCOUNTER — Other Ambulatory Visit (HOSPITAL_BASED_OUTPATIENT_CLINIC_OR_DEPARTMENT_OTHER): Payer: Medicare Other | Admitting: Lab

## 2012-05-23 ENCOUNTER — Ambulatory Visit (HOSPITAL_COMMUNITY)
Admission: RE | Admit: 2012-05-23 | Discharge: 2012-05-23 | Disposition: A | Discharge status: Home / Self Care | Payer: Medicare Other | Source: Ambulatory Visit | Attending: Internal Medicine | Admitting: Internal Medicine

## 2012-05-23 DIAGNOSIS — I868 Varicose veins of other specified sites: Secondary | ICD-10-CM | POA: Insufficient documentation

## 2012-05-23 DIAGNOSIS — K766 Portal hypertension: Secondary | ICD-10-CM | POA: Insufficient documentation

## 2012-05-23 DIAGNOSIS — C184 Malignant neoplasm of transverse colon: Secondary | ICD-10-CM

## 2012-05-23 DIAGNOSIS — K8020 Calculus of gallbladder without cholecystitis without obstruction: Secondary | ICD-10-CM | POA: Insufficient documentation

## 2012-05-23 DIAGNOSIS — K8689 Other specified diseases of pancreas: Secondary | ICD-10-CM | POA: Insufficient documentation

## 2012-05-23 DIAGNOSIS — C090 Malignant neoplasm of tonsillar fossa: Secondary | ICD-10-CM | POA: Insufficient documentation

## 2012-05-23 DIAGNOSIS — C61 Malignant neoplasm of prostate: Secondary | ICD-10-CM

## 2012-05-23 DIAGNOSIS — I81 Portal vein thrombosis: Secondary | ICD-10-CM

## 2012-05-23 DIAGNOSIS — K55059 Acute (reversible) ischemia of intestine, part and extent unspecified: Secondary | ICD-10-CM | POA: Insufficient documentation

## 2012-05-23 LAB — CBC WITH DIFFERENTIAL/PLATELET
BASO%: 0.7 % (ref 0.0–2.0)
EOS%: 5.2 % (ref 0.0–7.0)
Eosinophils Absolute: 0.1 10*3/uL (ref 0.0–0.5)
LYMPH%: 27.5 % (ref 14.0–49.0)
MCHC: 33.9 g/dL (ref 32.0–36.0)
MCV: 93.7 fL (ref 79.3–98.0)
MONO%: 10 % (ref 0.0–14.0)
NEUT#: 1.5 10*3/uL (ref 1.5–6.5)
Platelets: 53 10*3/uL — ABNORMAL LOW (ref 140–400)
RBC: 4.41 10*6/uL (ref 4.20–5.82)
RDW: 14.7 % — ABNORMAL HIGH (ref 11.0–14.6)
nRBC: 0 % (ref 0–0)

## 2012-05-23 LAB — CMP (CANCER CENTER ONLY)
AST: 51 U/L — ABNORMAL HIGH (ref 11–38)
Albumin: 3.6 g/dL (ref 3.3–5.5)
Alkaline Phosphatase: 101 U/L — ABNORMAL HIGH (ref 26–84)
BUN, Bld: 11 mg/dL (ref 7–22)
Creat: 1.3 mg/dl — ABNORMAL HIGH (ref 0.6–1.2)
Glucose, Bld: 252 mg/dL — ABNORMAL HIGH (ref 73–118)
Potassium: 5.1 mEq/L — ABNORMAL HIGH (ref 3.3–4.7)
Total Bilirubin: 1.2 mg/dl (ref 0.20–1.60)

## 2012-05-23 LAB — PROTHROMBIN TIME: Prothrombin Time: 14.9 seconds (ref 11.6–15.2)

## 2012-05-23 MED ORDER — IOHEXOL 300 MG/ML  SOLN: 100.0000 mL | Freq: Once | INTRAMUSCULAR | Status: AC | PRN

## 2012-05-28 ENCOUNTER — Telehealth: Payer: Self-pay | Admitting: Internal Medicine

## 2012-05-28 ENCOUNTER — Ambulatory Visit (HOSPITAL_BASED_OUTPATIENT_CLINIC_OR_DEPARTMENT_OTHER): Payer: Medicare Other | Admitting: Internal Medicine

## 2012-05-28 VITALS — BP 125/78 | HR 64 | Temp 97.1°F | Ht 72.0 in | Wt 180.6 lb

## 2012-05-28 DIAGNOSIS — C184 Malignant neoplasm of transverse colon: Secondary | ICD-10-CM

## 2012-05-28 DIAGNOSIS — C61 Malignant neoplasm of prostate: Secondary | ICD-10-CM

## 2012-05-28 DIAGNOSIS — C090 Malignant neoplasm of tonsillar fossa: Secondary | ICD-10-CM

## 2012-05-28 NOTE — Progress Notes (Signed)
[facility] [phone]   [phone]  OFFICE PROGRESS NOTE  [name], MD, MD [address] [place] [address]  DIAGNOSIS:  1. 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 2. 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 3. History of colon adenocarcinoma diagnosed in September of 2010 at [facility]. 4. History of hepatitis and liver cirrhosis. 5. History of gastrointestinal bleed. 6. Pancytopenia. 7. Mesenteric vein thrombosis.  PRIOR THERAPY:  1. Status post concurrent chemoradiation with weekly cisplatin for tonsillar squamous cell carcinoma, last dose was given March 22, 2008.  2. Status post curative radiotherapy with IMRT and androgen ablation for the prostate adenocarcinoma, completed December 10, 2008. 3. Status post left hemicolectomy at [facility] in November of 2010 revealing T3 N0 M0 colon adenocarcinoma and the patient did not receive adjuvant chemotherapy. 4. Status post treatment with Lovenox and Coumadin discontinued secondary to gastrointestinal bleed  CURRENT THERAPY: Observation.   INTERVAL HISTORY: Eddie [name] 71 y.o. male returns to the clinic today for three-month followup visit. The patient is feeling fine today with no specific complaints. He denied having any significant chest pain or shortness of breath, no cough or hemoptysis. He denied having any significant nausea or vomiting and no abdominal pain. No change in his bowel movement. He has repeat CT scan of the neck, chest, abdomen and pelvis performed recently and he is here today for evaluation and discussion of his scan results.  MEDICAL HISTORY: Past Medical History  Diagnosis Date  . Cancer     Status post curative radiotherapy with IMRT and androgen ablation for the prostate adenocarcinoma, completed December 10, 2008.  Marland Kitchen Cancer of colon     Status post left hemicolectomy at [facility] in November of 2010 revealing T3 N0 M0 colon adenocarcinoma and the patient did not receive adjuvant chemotherapy  . 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  Marland Kitchen Upper GI bleeding     EGD on 01/2011 shows esophagitis, grade 1 varices  . Hypertension   . Cirrhosis   . Anemia     bone marrow biopsy 11/2011,   . Diabetes mellitus   . Hepatitis C   . Gallstone   . Thrush, oral   . GERD (gastroesophageal reflux disease)   . DM gastroparesis   . Gastritis   . Hernia   . Pancreatitis   . Pancytopenia     ALLERGIES:   has no known allergies.  MEDICATIONS:  Current Outpatient Prescriptions  Medication Sig Dispense Refill  . ferrous sulfate 325 (65 FE) MG tablet Take 1 tablet (325 mg total) by mouth 2 (two) times daily.  30 tablet  11  . folic acid (FOLVITE) 1 MG tablet Take 1 tablet (1 mg total) by mouth daily.  100 tablet  3  . insulin glargine (LANTUS) 100 UNIT/ML injection Inject 25 units into the skin subcutaneously at night.  10 mL  11  . lisinopril (PRINIVIL,ZESTRIL) 5 MG tablet Take 1 tablet (5 mg total) by mouth daily.  30 tablet  11  . Multiple Vitamin (MULTIVITAMIN) tablet Take 1 tablet by mouth daily.  30 tablet  3  . nadolol (CORGARD) 80 MG tablet TAKE 1/2 TABLET (40mg ) BY MOUTH ONCE    DAILY.  15 tablet  11  . neomycin-bacitracin-polymyxin (NEOSPORIN) ointment Apply topically every 12 (twelve) hours. apply to the affected area as directed.  15 g  0  . omeprazole (PRILOSEC) 40 MG capsule Take 1 capsule (40 mg total) by mouth daily.  30 capsule  11    SURGICAL HISTORY:  Past Surgical History  Procedure Date  . Colon surgery   . Hernia repair   . Esophagogastroduodenoscopy 02/15/2012    Procedure: ESOPHAGOGASTRODUODENOSCOPY (EGD);  Surgeon: Eliezer Bottom., MD,FACG;  Location: [facility] ENDOSCOPY;  Service:  Endoscopy;  Laterality: N/A;    REVIEW OF SYSTEMS:  A comprehensive review of systems was negative.   PHYSICAL EXAMINATION: General appearance: alert, cooperative and no distress Neck: no adenopathy Resp: clear to auscultation bilaterally Cardio: regular rate and rhythm, S1, S2 normal, no murmur, click, rub or gallop GI: soft, non-tender; bowel sounds normal; no masses,  no organomegaly Extremities: extremities normal, atraumatic, no cyanosis or edema Neurologic: Alert and oriented X 3, normal strength and tone. Normal symmetric reflexes. Normal coordination and gait  ECOG PERFORMANCE STATUS: 1 - Symptomatic but completely ambulatory  Blood pressure 125/78, pulse 64, temperature 97.1 F (36.2 C), temperature source Oral, height 6' (1.829 m), weight 180 lb 9.6 oz (81.92 kg).  LABORATORY DATA: Lab Results  Component Value Date   WBC 2.7* 05/23/2012   HGB 14.0 05/23/2012   HCT 41.3 05/23/2012   MCV 93.7 05/23/2012   PLT 53* 05/23/2012      Chemistry      Component Value Date/Time   NA 140 05/23/2012 0925   NA 136 03/18/2012 1704   K 5.1* 05/23/2012 0925   K 4.6 03/18/2012 1704   CL 97* 05/23/2012 0925   CL 101 03/18/2012 1704   CO2 31 05/23/2012 0925   CO2 24 02/25/2012 1032   BUN 11 05/23/2012 0925   BUN 8 03/18/2012 1704   CREATININE 1.3* 05/23/2012 0925   CREATININE 1.20 03/18/2012 1704      Component Value Date/Time   CALCIUM 9.4 05/23/2012 0925   CALCIUM 9.6 02/25/2012 1032   ALKPHOS 101* 05/23/2012 0925   ALKPHOS 115 02/25/2012 1032   AST 51* 05/23/2012 0925   AST 44* 02/25/2012 1032   ALT 35 02/25/2012 1032   BILITOT 1.20 05/23/2012 0925   BILITOT 0.8 02/25/2012 1032       RADIOGRAPHIC STUDIES: Ct Soft Tissue Neck W Contrast  05/23/2012  *RADIOLOGY REPORT*  Clinical Data:  Head neck cancer.  Chemotherapy radiation therapy complete.  Additional history of prostate cancer and colon cancer.  CT NECK, CHEST, ABDOMEN AND PELVIS WITH CONTRAST  Technique:  Multidetector CT imaging of  the neck, chest, abdomen and pelvis was performed using the standard protocol following the bolus administration of intravenous contrast.  Contrast:  100 ml Omnipaque 300  Comparison:  Head CT and CT 12/21/2011  CT NECK  Findings:  There is no enhancing or asymmetric tissue within the posterior oropharynx or hypopharynx.  No abnormal tonsil tissue. No enlarged cervical lymph nodes.  Resection of the submandibular glands is noted.  No submental adenopathy.  Limited view of the inferior brain is normal.  Orbits are normal. No aggressive osseous lesions present.  IMPRESSION:  1.  No evidence of head neck cancer recurrence. 2.  No evidence of metastatic adenopathy in the neck.  CT CHEST  Findings: No axillary or supraclavicular lymphadenopathy.  No mediastinal or hilar lymphadenopathy.  Esophagus is mildly thickened throughout its course.  Review of the lung windows demonstrates a small  subpleural nodule in the left upper lobe which measures 4 mm not changed from prior.  No new suspicious pulmonary nodules.  IMPRESSION:  1.  No evidence of thoracic metastasis. 2.  Thickened esophagus may represent esophagitis.  CT ABDOMEN AND PELVIS  Findings: No focal hepatic lesion.  Gallbladder wall is mildly thickened similar to prior.  Small gallstones within the gallbladder fundus.  There is a filling defect within the main portal vein which extends into the left portal vein.  This of concern for propagation of  thrombus within the portal vein. Contrast flows around the central thrombus.  Thrombus extends back into the superior mesenteric vein.  There multiple venous collaterals in the epigastric region.  The pancreas demonstrates dilatation of the duct through the tail with chronic calcifications.  This is unchanged.  Spleen demonstrates a low density ill-defined lesion peripherally which is likely a perfusion abnormality.  Adrenal glands kidneys are normal.  The stomach demonstrates thickening of the gastric wall.  There is  thickening of the gastric antrum and fundus.  Small bowel is normal.  The ascending colon is normal.  There is dilatation of the transverse colon which likely represents an anastomotic site. There is a partial left colectomy.  No free fluid the pelvis.  The bladder has a shaggy appearance to the serosal surface.  This is similar to prior.  No pelvic lymphadenopathy.  Small inguinal hernia. Review of  bone windows demonstrates no aggressive osseous lesions.  IMPRESSION:  1.  No evidence metastasis in the abdomen or pelvis. 2.  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  This is partially occlusive. 3.  Stable postsurgical change in the colon with dilatation of the transverse colon. 4.  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 5.  Shagginess of the serosal surface of the bladder. Query radiation therapy. 6.  Chronic dilatation of the pancreatic duct through the pancreatic tail with calcifications.  7.  Thickening the gastric wall could represent gastritis. 8.  Cholelithiasis.  Original Report Authenticated By: Genevive Bi, M.D.   Ct Chest W Contrast  05/23/2012  *RADIOLOGY REPORT*  Clinical Data:  Head neck cancer.  Chemotherapy radiation therapy complete.  Additional history of prostate cancer and colon cancer.  CT NECK, CHEST, ABDOMEN AND PELVIS WITH CONTRAST  Technique:  Multidetector CT imaging of the neck, chest, abdomen and pelvis was performed using the standard protocol following the bolus administration of intravenous contrast.  Contrast:  100 ml Omnipaque 300  Comparison:  Head CT and CT 12/21/2011  CT NECK  Findings:  There is no enhancing or asymmetric tissue within the posterior oropharynx or hypopharynx.  No abnormal tonsil tissue. No enlarged cervical lymph nodes.  Resection of the submandibular glands is noted.  No submental adenopathy.  Limited view of the inferior brain is normal.  Orbits are  normal. No aggressive osseous lesions present.  IMPRESSION:  1.  No evidence of head neck cancer recurrence. 2.  No evidence of metastatic adenopathy in the neck.  CT CHEST  Findings: No axillary or supraclavicular lymphadenopathy.  No mediastinal or hilar lymphadenopathy.  Esophagus is mildly thickened throughout its course.  Review of the lung windows demonstrates a small subpleural nodule in the left upper lobe which measures 4 mm not changed from prior.  No new suspicious pulmonary nodules.  IMPRESSION:  1.  No evidence of thoracic metastasis. 2.  Thickened esophagus may represent esophagitis.  CT ABDOMEN  AND PELVIS  Findings: No focal hepatic lesion.  Gallbladder wall is mildly thickened similar to prior.  Small gallstones within the gallbladder fundus.  There is a filling defect within the main portal vein which extends into the left portal vein.  This of concern for propagation of  thrombus within the portal vein. Contrast flows around the central thrombus.  Thrombus extends back into the superior mesenteric vein.  There multiple venous collaterals in the epigastric region.  The pancreas demonstrates dilatation of the duct through the tail with chronic calcifications.  This is unchanged.  Spleen demonstrates a low density ill-defined lesion peripherally which is likely a perfusion abnormality.  Adrenal glands kidneys are normal.  The stomach demonstrates thickening of the gastric wall.  There is thickening of the gastric antrum and fundus.  Small bowel is normal.  The ascending colon is normal.  There is dilatation of the transverse colon which likely represents an anastomotic site. There is a partial left colectomy.  No free fluid the pelvis.  The bladder has a shaggy appearance to the serosal surface.  This is similar to prior.  No pelvic lymphadenopathy.  Small inguinal hernia. Review of  bone windows demonstrates no aggressive osseous lesions.  IMPRESSION:  1.  No evidence metastasis in the abdomen or  pelvis. 2.  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  This is partially occlusive. 3.  Stable postsurgical change in the colon with dilatation of the transverse colon. 4.  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 5.  Shagginess of the serosal surface of the bladder. Query radiation therapy. 6.  Chronic dilatation of the pancreatic duct through the pancreatic tail with calcifications.  7.  Thickening the gastric wall could represent gastritis. 8.  Cholelithiasis.  Original Report Authenticated By: Genevive Bi, M.D.     ASSESSMENT: This is a very pleasant 71 years old African American male with history of multiple malignancies including a stage III tonsillar carcinoma, history of prostate adenocarcinoma, history of colon adenocarcinoma as well as history of hepatitis and liver cirrhosis in addition to pancytopenia, mesenteric vein thrombosis and recently gastrointestinal bleed. The patient has no evidence for disease recurrence on the recent scan but has evidence for progression of thrombus from the superior mesenteric vein now to involve the main portal vein and left portal vein which is partially occlusive. He is a high risk for anticoagulation because of the previous GI bleed. The patient is currently asymptomatic.  PLAN: I recommended for him continuous observation for now with repeat CT scan of the neck, chest abdomen and pelvis in 6 months. He would also continue on observation for the portal vein thrombosis because of the high risk of anticoagulation in the absence of no symptoms. The patient also has a history of pancytopenia and previous bone marrow biopsy and aspirate showed nonspecific findings. I will continue him on observation. He was advised to call me immediately if he has any concerning symptoms in the interval.  All questions were answered. The patient knows to call the clinic with any  problems, questions or concerns. We can certainly see the patient much sooner if necessary.

## 2012-05-28 NOTE — Telephone Encounter (Signed)
Gv pt appt for [id number].  scheduled pt for ct scan on 11/25 @ [facility] 

## 2012-06-20 ENCOUNTER — Ambulatory Visit (INDEPENDENT_AMBULATORY_CARE_PROVIDER_SITE_OTHER): Payer: Medicare Other | Admitting: Internal Medicine

## 2012-06-20 ENCOUNTER — Encounter: Payer: Self-pay | Admitting: Internal Medicine

## 2012-06-20 VITALS — BP 125/77 | HR 59 | Temp 97.0°F | Resp 20 | Ht 71.5 in | Wt 184.9 lb

## 2012-06-20 DIAGNOSIS — R05 Cough: Secondary | ICD-10-CM

## 2012-06-20 DIAGNOSIS — E119 Type 2 diabetes mellitus without complications: Secondary | ICD-10-CM

## 2012-06-20 LAB — GLUCOSE, CAPILLARY: Glucose-Capillary: 293 mg/dL — ABNORMAL HIGH (ref 70–99)

## 2012-06-20 MED ORDER — GLUCOSE BLOOD VI STRP: ORAL_STRIP | Status: AC

## 2012-06-20 MED ORDER — INSULIN GLARGINE 100 UNIT/ML ~~LOC~~ SOLN: SUBCUTANEOUS | Status: DC

## 2012-06-20 MED ORDER — ACCU-CHEK FASTCLIX LANCETS MISC: 1.0000 | Freq: Two times a day (BID) | Status: DC

## 2012-06-20 NOTE — Progress Notes (Signed)
Subjective:     Patient ID: Eddie [name], male   DOB: 05-Oct-1941, 71 y.o.   MRN: [id number]  HPI Patient is a very pleasant 71 year old man with an extensive past medical history including prostate cancer, throat cancer, colon cancer, HCV, hypertension, diabetes, cirrhosis who presents with cough.  Patient says for the past 3 months he has had intermittent nighttime cough for a few minutes at a time. It is dry and relieved with throat lozenge. He denies any symptoms during the day except for on rare occasions. She denies fevers or chills, rhinorrhea, sore throat, otalgia, pruritus in the ears or eyes, burping, epigastric pain, symptoms of reflux. Given his extensive history, he was concerned enough to come in.  He has been followed by oncology and is being closely monitored.  Review of Systems As per history of present illness    Objective:   Physical Exam GEN: NAD.  Alert and oriented x 3.  Pleasant, conversant, and cooperative to exam. RESP:  CTAB, no w/r/r CARDIOVASCULAR: RRR, S1, S2, no m/r/g ABDOMEN: soft, NT/ND, NABS EXT: warm and dry. No edema in b/l LE    Assessment:       Plan:

## 2012-06-20 NOTE — Assessment & Plan Note (Addendum)
Patient with 3 months of cough typically at night, relieved with throat lozenge. The differential for cough in this patient is quite extensive given his medical history. First, he is on an ACE inhibitor. Second, he is on a PPI and has a history of esophagitis seen on EGD. He also has a history of thrush. 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 PE is also a possibility given his mesenteric vein thrombosis. The patient is well-appearing on exam with clear lungs and throat. - Trial off lisinopril - Patient had been put on lisinopril because of increased microalbumin - Microalbumin should be repeated off of lisinopril a second time to confirm, at which time depending on cough, lisinopril can be reinitiated - Continue PPI - RTC if cough continues

## 2012-06-20 NOTE — Assessment & Plan Note (Signed)
A1c today of 12.3. Brought his meter but has not been checking because of lack of supplies. Says he has been eating a lot of ice cream to try gain weight. - Increase Lantus from 25-30 units - Refilled supplies - Consider starting baby aspirin - Need to recheck urine micro-albumen given that we discontinued his lisinopril - Consider ARB in the future - Lipids controlled off statin

## 2012-07-09 ENCOUNTER — Telehealth: Payer: Self-pay | Admitting: Licensed Clinical Social Worker

## 2012-07-09 NOTE — Telephone Encounter (Signed)
Mr. Pustejovsky was referred to CSW for information on Medical Alert Systems.  CSW has no recommendation, but awaiting listing from [employer] [place].  CSW placed call to Mr. Zou and informed him of listing [employer] may have.  Once CSW receives listing will place in mail for pt.  If [employer] does not have a recommending listing, CSW will provide pt with agencies [employer], etc.).  During Mr. Maue' most recent [facility] appt, pt had voiced some concern regarding affording is diabetic supplies and not checking his blood sugars.  CSW inquired if pt is having difficulty affording diabetic supplies.  Mr. Doolen has medicare/medicaid.  Pt states currently he is not having an issue and is doing well.  CSW offered in-home disease education/management as available through [facility], pt declined at this time.   Pt in need of medical alert information. CSW awaiting return call from [employer].

## 2012-07-10 NOTE — Telephone Encounter (Signed)
CSW received listing from [employer] [facility] and brochure from [place].  CSW mailed information to pt.

## 2012-10-01 ENCOUNTER — Encounter: Payer: Self-pay | Admitting: Internal Medicine

## 2012-10-08 ENCOUNTER — Telehealth: Payer: Self-pay | Admitting: Dietician

## 2012-10-08 NOTE — Telephone Encounter (Signed)
Just scheduled an appointment for 10-16 13 with Dr. Manson Passey and says he'll be here. Will ask pt then when last eye appointment with Dr. Mitzi Davenport was.

## 2012-10-10 ENCOUNTER — Encounter: Payer: Self-pay | Admitting: Internal Medicine

## 2012-10-15 ENCOUNTER — Other Ambulatory Visit: Payer: Self-pay | Admitting: Internal Medicine

## 2012-10-15 ENCOUNTER — Ambulatory Visit (INDEPENDENT_AMBULATORY_CARE_PROVIDER_SITE_OTHER): Payer: Medicare Other | Admitting: Internal Medicine

## 2012-10-15 VITALS — BP 144/85 | HR 64 | Temp 97.8°F | Ht 71.0 in | Wt 190.4 lb

## 2012-10-15 DIAGNOSIS — E119 Type 2 diabetes mellitus without complications: Secondary | ICD-10-CM

## 2012-10-15 DIAGNOSIS — Z23 Encounter for immunization: Secondary | ICD-10-CM

## 2012-10-15 MED ORDER — INSULIN GLARGINE 100 UNIT/ML ~~LOC~~ SOLN: SUBCUTANEOUS | Status: DC

## 2012-10-15 NOTE — Progress Notes (Signed)
  Subjective:    Patient ID: Eddie [name], male    DOB: October 21, 1941, 71 y.o.   MRN: [id number]  HPI  Presents today for f/u of DM Type 2 and for flu shot. Pt brought in glucometer today but states that he "really doesn't check" his sugars because doesn't know how to use the meter. No complaints.  Review of Systems  Constitutional: Negative for fever and fatigue.  HENT: Negative for congestion.   Respiratory: Negative for cough and shortness of breath.   Cardiovascular: Negative for chest pain.  Gastrointestinal: Negative for abdominal pain, diarrhea, constipation and blood in stool.  Genitourinary: Negative for difficulty urinating.  Skin: Negative for rash.       Objective:   Physical Exam  Constitutional: He is oriented to person, place, and time. He appears well-developed and well-nourished. No distress.  HENT:  Head: Normocephalic and atraumatic.  Eyes: Conjunctivae normal and EOM are normal. Pupils are equal, round, and reactive to light.  Neck: Normal range of motion.  Cardiovascular: Normal rate, regular rhythm, normal heart sounds and intact distal pulses.   Pulmonary/Chest: Effort normal and breath sounds normal.  Abdominal: Soft. Bowel sounds are normal.  Musculoskeletal: Normal range of motion. He exhibits no edema.  Neurological: He is alert and oriented to person, place, and time.  Skin: Skin is warm and dry.  Psychiatric: He has a normal mood and affect.          Assessment & Plan:  1. DM Type 2: HgbA1c above goal today at 9 but improved from 12 in June 2013, seen by DME today and trained to use glucometer -11/7 eye check with Dr. Mitzi Davenport -DME to f/u in a few days to re-assess ability to check cbgs at home -increase Lantus to 34 units q evening with reassessment in 2 weeks  2. Preventative Care: flu shot today  3. Htn: slightly above goal, will reassess at 2 week f/u  4. Multiple carcinomas (tonsillar, prostate, colon): s/p chemorad 2009 ,  hemicolectomy in 2010, now observation only followed by Oncology

## 2012-10-15 NOTE — Patient Instructions (Addendum)
You received the flu shot today. We also reviewed how to use your blood sugar meter today. We increased your Lantus to 34 units in the evening. Check your sugars daily and bring your meter with you when you return in 2 weeks. Follow-up in 2 weeks with PCP.

## 2012-10-17 ENCOUNTER — Encounter: Payer: Self-pay | Admitting: Internal Medicine

## 2012-10-22 ENCOUNTER — Other Ambulatory Visit: Payer: Self-pay | Admitting: Internal Medicine

## 2012-10-22 MED ORDER — LISINOPRIL 10 MG PO TABS: 10.0000 mg | ORAL_TABLET | Freq: Every day | ORAL | Status: DC

## 2012-10-29 ENCOUNTER — Telehealth: Payer: Self-pay | Admitting: Dietician

## 2012-10-29 NOTE — Telephone Encounter (Signed)
Doing fine with meter. Used it yesterday- result was 321 fasting. He says No other readings and has been taking 34 units daily. Front office scheduled 2 weeks f/up per Dr. Bosie Clos for Nov 13th. Suspect patient may need assistance/support for sustained glucose self monitoring.

## 2012-11-11 ENCOUNTER — Telehealth: Payer: Self-pay | Admitting: *Deleted

## 2012-11-11 NOTE — Telephone Encounter (Signed)
Pt states for a few days cbg's have been in 300's, appt fri 11/15 at 1015 dr Shirlee Latch. He is asked if problem becomes worse to please go to urg care or ED, he is agreeable

## 2012-11-12 NOTE — Telephone Encounter (Signed)
Agree with plan, thank you!

## 2012-11-14 ENCOUNTER — Ambulatory Visit (INDEPENDENT_AMBULATORY_CARE_PROVIDER_SITE_OTHER): Payer: Medicare Other | Admitting: Internal Medicine

## 2012-11-14 ENCOUNTER — Ambulatory Visit (INDEPENDENT_AMBULATORY_CARE_PROVIDER_SITE_OTHER): Payer: Medicare Other | Admitting: Dietician

## 2012-11-14 ENCOUNTER — Encounter: Payer: Self-pay | Admitting: Internal Medicine

## 2012-11-14 ENCOUNTER — Encounter: Payer: Self-pay | Admitting: Dietician

## 2012-11-14 ENCOUNTER — Encounter: Payer: Self-pay | Admitting: Licensed Clinical Social Worker

## 2012-11-14 VITALS — BP 134/85 | HR 62 | Temp 96.9°F | Ht 72.0 in | Wt 190.7 lb

## 2012-11-14 DIAGNOSIS — E119 Type 2 diabetes mellitus without complications: Secondary | ICD-10-CM

## 2012-11-14 DIAGNOSIS — Z598 Other problems related to housing and economic circumstances: Secondary | ICD-10-CM

## 2012-11-14 DIAGNOSIS — IMO0002 Reserved for concepts with insufficient information to code with codable children: Secondary | ICD-10-CM

## 2012-11-14 DIAGNOSIS — I864 Gastric varices: Secondary | ICD-10-CM | POA: Insufficient documentation

## 2012-11-14 DIAGNOSIS — I868 Varicose veins of other specified sites: Secondary | ICD-10-CM

## 2012-11-14 MED ORDER — LISINOPRIL 10 MG PO TABS: 10.0000 mg | ORAL_TABLET | Freq: Every day | ORAL | Status: DC

## 2012-11-14 MED ORDER — NADOLOL 80 MG PO TABS: 80.0000 mg | ORAL_TABLET | Freq: Every day | ORAL | Status: DC

## 2012-11-14 MED ORDER — INSULIN GLARGINE 100 UNIT/ML ~~LOC~~ SOLN: SUBCUTANEOUS | Status: DC

## 2012-11-14 NOTE — Progress Notes (Signed)
Medical Nutrition Therapy:  Appt start time: 1100 end time:  1115.  Assessment:  Primary concerns today: Blood sugar control.  Patient reports since cancer lacks taste, eats because he knows he has to. Reports moderate intake.  Usual eating pattern includes 2 meals and 0-2 snacks per day. Usual physical activity includes ADLs. Gastroparesis and labs noted.  Everyday foods include toast with jelly in am , juice- 20 oz per day, coffee, sherbet, glucerna and ensure, jelly.  Avoided foods include peanutbutter.      Progress Towards Goal(s):  In progress.   Nutritional Diagnosis:  NB-1.1 Food and nutrition-related knowledge deficit As related to lacxk of previous education about foods that affect blood sugar.  As evidenced by patient report and questions.    Intervention:  Nutrition education about carb foods and options and options for increased nutrient intake.  Monitoring/Evaluation:  Dietary intake, exercise, blood sugars, and body weight by phone in 2 weeks and in office in 4 weeks.

## 2012-11-14 NOTE — Assessment & Plan Note (Signed)
Rx refill Nadolol 80 mg qd

## 2012-11-14 NOTE — Progress Notes (Signed)
Subjective:    Patient ID: Eddie [name], male    DOB: November 16, 1941, 71 y.o.   MRN: [id number]  HPI Comments: 71 y.o significant PMH HCV, tonsilalr, prostate and colon cancer, DM 2 with complications gastroparesis, history of alcohol abuse, HTN (BP 148/75 today intially), history gastric varices, gastritis GERD, ventral hernia, chronic pancreatitis, portal gastropathy, chronic cough, history of upper GI bleed, mesenteric and portal vein thrombosis.   He presents for elevated blood glucose readings 300s-500s at home with type 2 DM (fasting glucose 211 today, HA1C 9.0 November 01, 2012 and MicroAlb/Cr ratio 213). He is checking his fsbs once at night and taking 34 units at night. He denies any low fsbs.  He is eating grits, boiled eggs, toast with jelly (unsure if sugar free), Sherbert ice cream, greens, drinking Ensure or Glucerna x 2.  He eats at [facility] a lot getting a coffee but uses sweet and low.  He denies fever, chills, chest pain, sob, dysuria, blood with urination or bowel movements, ab pain, lesions/rash to skin, mouth pain or lesions in his mouth though he has dentures, denies constipation or diarrhea.  He states his stomach was upset this am like he needed to take Peptobismol and he is taking Prilosec bid.  He does report a dry cough x 1 month but it is not bothersome and he is using sugar free cough drops.  He denies sick contacts.  He did go see the eye doctor 10/2012.  He does not currently receive home health but is able to manage his medications, meals okay.    He needs Rx refills of lisinopril and Nadolol.   He would like to speak to the social worker about transportation issues.  He takes the bus.    He has an upcoming colonoscopy in 11/2012 and ?PET scan  SH: lives alone, no children, never been married. His mother just died 11-01-2012.       Diabetes He presents for his follow-up diabetic visit. He has type 2 diabetes mellitus. His disease course has been worsening. There are no  hypoglycemic associated symptoms. Pertinent negatives for diabetes include no chest pain.      Review of Systems  Cardiovascular: Negative for chest pain.       Objective:   Physical Exam  Nursing note and vitals reviewed. Constitutional: He is oriented to person, place, and time. He appears well-developed and well-nourished. He is cooperative. No distress.       pleasant  HENT:  Head: Normocephalic and atraumatic.  Mouth/Throat: Oropharynx is clear and moist and mucous membranes are normal. He has dentures. No oropharyngeal exudate.  Eyes: Conjunctivae normal are normal. Pupils are equal, round, and reactive to light. Right eye exhibits no discharge. Left eye exhibits no discharge. No scleral icterus.  Cardiovascular: Regular rhythm, S1 normal and S2 normal.   No murmur heard.      Slightly bradycardic   Pulmonary/Chest: Effort normal and breath sounds normal. No respiratory distress. He has no wheezes.  Abdominal: Soft. Bowel sounds are normal. He exhibits no distension. There is no tenderness.         Normal bowel sounds  Neurological: He is alert and oriented to person, place, and time. Gait normal.  Skin: Skin is warm, dry and intact. No rash noted. He is not diaphoretic.  Psychiatric: He has a normal mood and affect. His speech is normal and behavior is normal. Judgment and thought content normal. Cognition and memory are normal.  Assessment & Plan:  Follow up 12/2012 will call in 2-4 weeks follow up DM

## 2012-11-14 NOTE — Assessment & Plan Note (Signed)
Uncontrolled (fasting 211, HA1C 9.0 in 09/2012, fsbs at home ranging 300s-400s)  Patient counseled on diet as he was eating ice cream sherbet, jelly (?if sugar free) Given a list of foods and how much to eat with diabetes Will increase his Lantus 34 units to 38 units qhs. He only checks his fsbs at night when given his Lantus  Lipid Panel     Component Value Date/Time   CHOL 140 02/14/2012 1155   TRIG 116 02/14/2012 1155   HDL 47 02/14/2012 1155   CHOLHDL 3.0 02/14/2012 1155   VLDL 23 02/14/2012 1155   LDLCALC 70 02/14/2012 1155   LDL at goal BP 148/75-->157/87 then sbp in the 130s initially but repeat showed decreased BP-Rx refill Lisinopril 10 mg qd and patient's Alb/creatine ration was elevated in 09/2012 213 He saw the eye MD 10/2012

## 2012-11-14 NOTE — Patient Instructions (Addendum)
I will call you in 2-4 weeks to see how your sugars are going Please limit your ice cream to 1/2 cup and sugar free ice cream is preferred  Please increase your Lantus to 38 units at night Pick up your prescription from the pharmacy for Lisinopril and Nadolol  Come back in 12/2012 otherwise I will call you in 2-4 weeks   Type 2 Diabetes Diabetes is a long-lasting (chronic) disease. One or both of the following happen with type 2 diabetes:   The pancreas does not make enough of a hormone called insulin.  The body has trouble using the insulin that is made. HOME CARE  Check your blood sugar (glucose) once a day, or as told by your doctor.  Take all medicine as told by your doctor.  Do not smoke.  Eat healthy foods. Weight loss can help your diabetes.  Learn about low blood sugar (hypoglycemia). Know how to treat it.  Get your eyes checked on a regular basis.  Get a physical exam every year. Get your blood pressure checked. Get your blood and pee (urine) tested.  Wear a necklace or bracelet that says you have diabetes.  Check your feet every night for cuts, sores, blisters, and redness. Tell your doctor if you have problems. GET HELP RIGHT AWAY IF:  You have trouble keeping your blood sugar in target range.  You have problems with your medicines.  You are sick and not getting better after 24 hours.  You have a sore or wound that is not healing.  You have vision problems or changes.  You have a fever. MAKE SURE YOU:  Understand these instructions.  Will watch your condition.  Will get help right away if you are not doing well or get worse. Document Released: 09/25/2008 Document Revised: 03/10/2012 Document Reviewed: 06/04/2011 [facility] Patient Information 2013 [place], [place].

## 2012-11-19 NOTE — Progress Notes (Signed)
Eddie [name] was referred to CSW for transportation concerns.  CSW met with pt after his scheduled [facility] appt.  Pt currently uses the fixed route [employer] for transportation and is comfortable continuing to use [employer] for transportation, however cost is an issue.  Eddie [name] has medicaid and CSW informed Eddie [name] of medicaid transportation benefit.  Pt in agreement for referral to [facility] for medical transportation.  Referral completed.  Eddie [name] also discussed medical alert systems and not wanted to have to give credit card information.  CSW provided Eddie [name] with [employer] [facility] referral list for Medical Alert systems.  CSW also provided Eddie [name] with information on [employer] as an economical way to obtain prepared meals.  Pt denies add'l needs at this time.  Pt is aware of CSW contact information.

## 2012-11-21 ENCOUNTER — Telehealth: Payer: Self-pay | Admitting: Internal Medicine

## 2012-11-21 NOTE — Telephone Encounter (Signed)
called pt as he needed to r./s his lab and scan for monday to a later time/done

## 2012-11-24 ENCOUNTER — Other Ambulatory Visit (HOSPITAL_BASED_OUTPATIENT_CLINIC_OR_DEPARTMENT_OTHER): Payer: Medicare Other

## 2012-11-24 ENCOUNTER — Other Ambulatory Visit: Payer: Medicare Other | Admitting: Lab

## 2012-11-24 ENCOUNTER — Ambulatory Visit (HOSPITAL_COMMUNITY)
Admission: RE | Admit: 2012-11-24 | Discharge: 2012-11-24 | Disposition: A | Discharge status: Home / Self Care | Payer: Medicare Other | Source: Ambulatory Visit | Attending: Internal Medicine | Admitting: Internal Medicine

## 2012-11-24 ENCOUNTER — Ambulatory Visit (HOSPITAL_COMMUNITY): Payer: Medicare Other

## 2012-11-24 DIAGNOSIS — C184 Malignant neoplasm of transverse colon: Secondary | ICD-10-CM

## 2012-11-24 DIAGNOSIS — I7090 Unspecified atherosclerosis: Secondary | ICD-10-CM | POA: Insufficient documentation

## 2012-11-24 DIAGNOSIS — C61 Malignant neoplasm of prostate: Secondary | ICD-10-CM

## 2012-11-24 DIAGNOSIS — I81 Portal vein thrombosis: Secondary | ICD-10-CM

## 2012-11-24 LAB — COMPREHENSIVE METABOLIC PANEL (CC13)
BUN: 10 mg/dL (ref 7.0–26.0)
CO2: 27 mEq/L (ref 22–29)
Calcium: 9.9 mg/dL (ref 8.4–10.4)
Chloride: 101 mEq/L (ref 98–107)
Creatinine: 1.3 mg/dL (ref 0.7–1.3)
Total Bilirubin: 1.57 mg/dL — ABNORMAL HIGH (ref 0.20–1.20)

## 2012-11-24 LAB — CBC WITH DIFFERENTIAL/PLATELET
Basophils Absolute: 0 10*3/uL (ref 0.0–0.1)
Eosinophils Absolute: 0.2 10*3/uL (ref 0.0–0.5)
HCT: 42.4 % (ref 38.4–49.9)
HGB: 14.6 g/dL (ref 13.0–17.1)
MONO#: 0.3 10*3/uL (ref 0.1–0.9)
NEUT%: 58.1 % (ref 39.0–75.0)
Platelets: 49 10*3/uL — ABNORMAL LOW (ref 140–400)
WBC: 3 10*3/uL — ABNORMAL LOW (ref 4.0–10.3)
lymph#: 0.8 10*3/uL — ABNORMAL LOW (ref 0.9–3.3)

## 2012-11-24 MED ORDER — IOHEXOL 300 MG/ML  SOLN
100.0000 mL | Freq: Once | INTRAMUSCULAR | Status: AC | PRN
  Administered 2012-11-24: 100 mL via INTRAVENOUS

## 2012-11-25 ENCOUNTER — Telehealth: Payer: Self-pay | Admitting: Internal Medicine

## 2012-11-25 NOTE — Telephone Encounter (Signed)
Spoke with patient.  His fsbs is still running high 319, 318, 317; 300s.  He is giving himself 38 units qhs and is not missing doses.  He has had some low values in the 100s x 1 time but otherwise his fsbs are running in the 300s.  Advised patient to make an appt to f/u for uncontrolled fsbs.     Shirlee Latch MD.

## 2012-11-26 ENCOUNTER — Telehealth: Payer: Self-pay | Admitting: Internal Medicine

## 2012-11-26 ENCOUNTER — Ambulatory Visit (HOSPITAL_BASED_OUTPATIENT_CLINIC_OR_DEPARTMENT_OTHER): Payer: Medicare Other | Admitting: Internal Medicine

## 2012-11-26 VITALS — BP 132/77 | HR 62 | Temp 97.1°F | Resp 20 | Ht 72.0 in | Wt 190.8 lb

## 2012-11-26 DIAGNOSIS — C184 Malignant neoplasm of transverse colon: Secondary | ICD-10-CM

## 2012-11-26 DIAGNOSIS — K55059 Acute (reversible) ischemia of intestine, part and extent unspecified: Secondary | ICD-10-CM

## 2012-11-26 DIAGNOSIS — C61 Malignant neoplasm of prostate: Secondary | ICD-10-CM

## 2012-11-26 DIAGNOSIS — C090 Malignant neoplasm of tonsillar fossa: Secondary | ICD-10-CM

## 2012-11-26 NOTE — Progress Notes (Signed)
[facility] [phone]   [phone]  OFFICE PROGRESS NOTE  [name], MD [address] [place] [address]  DIAGNOSIS:  1. 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 2. 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 3. History of colon adenocarcinoma diagnosed in September of 2010 at [facility]. 4. History of hepatitis and liver cirrhosis. 5. History of gastrointestinal bleed. 6. Pancytopenia. 7. Mesenteric vein thrombosis.  PRIOR THERAPY:  1. Status post concurrent chemoradiation with weekly cisplatin for tonsillar squamous cell carcinoma, last dose was given March 22, 2008.  2. Status post curative radiotherapy with IMRT and androgen ablation for the prostate adenocarcinoma, completed December 10, 2008. 3. Status post left hemicolectomy at [facility] in November of 2010 revealing T3 N0 M0 colon adenocarcinoma and the patient did not receive adjuvant chemotherapy. 4. Status post treatment with Lovenox and Coumadin discontinued secondary to gastrointestinal bleed  CURRENT THERAPY: Observation.  INTERVAL HISTORY: Eddie [name] 71 y.o. male returns to the clinic today for routine six-month followup visit. The patient is feeling fine today with no specific complaints. He denied having any significant weight loss or night sweats. He denied having any chest pain, shortness breath, cough or hemoptysis. He has no difficulty swallowing. He has repeat CT scan of the neck, chest, abdomen and pelvis performed recently and he is here for evaluation and discussion of his scan results.  MEDICAL HISTORY: Past Medical History  Diagnosis Date  . Cancer     Status post curative radiotherapy with IMRT and androgen ablation for the prostate adenocarcinoma, completed December 10, 2008.  Marland Kitchen Cancer of colon     Status post left hemicolectomy at  [facility] in November of 2010 revealing T3 N0 M0 colon adenocarcinoma and the patient did not receive adjuvant chemotherapy  . 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  Marland Kitchen Upper GI bleeding     EGD on 01/2011 shows esophagitis, grade 1 varices  . Hypertension   . Cirrhosis   . Anemia     bone marrow biopsy 11/2011,   . Diabetes mellitus   . Hepatitis C   . Gallstone   . Thrush, oral   . GERD (gastroesophageal reflux disease)   . DM gastroparesis   . Gastritis   . Hernia   . Pancreatitis   . Pancytopenia     ALLERGIES:   has no known allergies.  MEDICATIONS:  Current Outpatient Prescriptions  Medication Sig Dispense Refill  . ACCU-CHEK FASTCLIX LANCETS MISC 1 each by Does not apply route 2 (two) times daily.  102 each  6  . ferrous sulfate 325 (65 FE) MG tablet Take 1 tablet (325 mg total) by mouth 2 (two) times daily.  30 tablet  11  . folic acid (FOLVITE) 1 MG tablet Take 1 tablet (1 mg total) by mouth daily.  100 tablet  3  . glucose blood (ACCU-CHEK SMARTVIEW) test strip Use as instructed  100 each  12  . insulin glargine (LANTUS) 100 UNIT/ML injection Inject 38 units into the skin subcutaneously at night.  10 mL  11  . lisinopril (PRINIVIL,ZESTRIL) 10 MG tablet Take 1 tablet (10 mg total) by mouth daily.  30 tablet  11  . Multiple Vitamin (MULTIVITAMIN) tablet Take 1 tablet  by mouth daily.  30 tablet  3  . nadolol (CORGARD) 80 MG tablet Take 1 tablet (80 mg total) by mouth daily.  15 tablet  11  . neomycin-bacitracin-polymyxin (NEOSPORIN) ointment Apply topically every 12 (twelve) hours. apply to the affected area as directed.  15 g  0  . omeprazole (PRILOSEC) 40 MG capsule Take 1 capsule (40 mg total) by mouth daily.  30 capsule  11    SURGICAL HISTORY:  Past Surgical History  Procedure Date  . Colon surgery   . Hernia repair   .  Esophagogastroduodenoscopy 02/15/2012    Procedure: ESOPHAGOGASTRODUODENOSCOPY (EGD);  Surgeon: Eliezer Bottom., MD,FACG;  Location: [facility] END[facility]COPY;  Service: Endoscopy;  Laterality: N/A;    REVIEW OF SYSTEMS:  A comprehensive review of systems was negative.   PHYSICAL EXAMINATION: General appearance: alert, cooperative and no distress Head: Normocephalic, without obvious abnormality, atraumatic Neck: no adenopathy Resp: clear to auscultation bilaterally Cardio: regular rate and rhythm, S1, S2 normal, no murmur, click, rub or gallop GI: soft, non-tender; bowel sounds normal; no masses,  no organomegaly Extremities: extremities normal, atraumatic, no cyanosis or edema  ECOG PERFORMANCE STATUS: 1 - Symptomatic but completely ambulatory  Blood pressure 132/77, pulse 62, temperature 97.1 F (36.2 C), temperature source Oral, resp. rate 20, height 6' (1.829 m), weight 190 lb 12.8 oz (86.546 kg).  LABORATORY DATA: Lab Results  Component Value Date   WBC 3.0* 11/24/2012   HGB 14.6 11/24/2012   HCT 42.4 11/24/2012   MCV 98.1* 11/24/2012   PLT 49* 11/24/2012      Chemistry      Component Value Date/Time   NA 134* 11/24/2012 1427   NA 140 05/23/2012 0925   NA 136 03/18/2012 1704   K 3.9 11/24/2012 1427   K 5.1* 05/23/2012 0925   K 4.6 03/18/2012 1704   CL 101 11/24/2012 1427   CL 97* 05/23/2012 0925   CL 101 03/18/2012 1704   CO2 27 11/24/2012 1427   CO2 31 05/23/2012 0925   CO2 24 02/25/2012 1032   BUN 10.0 11/24/2012 1427   BUN 11 05/23/2012 0925   BUN 8 03/18/2012 1704   CREATININE 1.3 11/24/2012 1427   CREATININE 1.3* 05/23/2012 0925   CREATININE 1.20 03/18/2012 1704      Component Value Date/Time   CALCIUM 9.9 11/24/2012 1427   CALCIUM 9.4 05/23/2012 0925   CALCIUM 9.6 02/25/2012 1032   ALKPHOS 110 11/24/2012 1427   ALKPHOS 101* 05/23/2012 0925   ALKPHOS 115 02/25/2012 1032   AST 48* 11/24/2012 1427   AST 51* 05/23/2012 0925   AST 44* 02/25/2012 1032   ALT 37 11/24/2012 1427    ALT 35 02/25/2012 1032   BILITOT 1.57* 11/24/2012 1427   BILITOT 1.20 05/23/2012 0925   BILITOT 0.8 02/25/2012 1032       RADIOGRAPHIC STUDIES: Ct Soft Tissue Neck W Contrast  11/24/2012  *RADIOLOGY REPORT*  Clinical Data: Throat cancer.  CT NECK WITH CONTRAST  Technique:  Multidetector CT imaging of the neck was performed with intravenous contrast.  Contrast: OMNIPAQUE IOHEXOL 300 MG/ML  SOLN  Comparison: CT of the neck 05/23/2012.  Findings: No focal mucosal or submucosal lesions are present. Limited imaging of the brain is within normal limits.  The parapharyngeal spaces are clear.  No significant cervical adenopathy is present.  Atherosclerotic calcifications are again noted at the carotid bifurcations bilaterally without significant stenosis.  Degenerative changes of the cervical spine most evident at C5-6 and C6-7.  The lung apices are clear.  IMPRESSION:  1.  No evidence for residual or recurrent head neck tumor. 2.  No evidence metastatic disease in the neck.  93 stable spondylosis of the cervical spine. 3.  Atherosclerosis.   Original Report Authenticated By: Marin Roberts, M.D.    Ct Chest W Contrast  11/24/2012  *RADIOLOGY REPORT*  Clinical Data:  Stage III tonsil carcinoma diagnosed 2009. Adenocarcinoma of prostate gland diagnosed 2009.  Colon adenocarcinoma diagnosed 2010.  CT CHEST, ABDOMEN AND PELVIS WITH CONTRAST  Technique:  Multidetector CT imaging of the chest, abdomen and pelvis was performed following the standard protocol during bolus administration of intravenous contrast.  Contrast: OMNIPAQUE IOHEXOL 300 MG/ML  SOLN  Comparison:  CT 05/23/2012  CT CHEST  Findings:  No axillary or supraclavicular lymphadenopathy.  No mediastinal or hilar lymphadenopathy.  No pericardial fluid.  There are no esophageal varices noted.  No suspicious pulmonary nodules.  IMPRESSION: No evidence of thoracic metastasis.  CT ABDOMEN AND PELVIS  Findings:  No focal hepatic lesion. Stable  thrombus within the portal vein.  The gallbladder, pancreas, adrenal glands are unchanged.  There is dilatation of the pancreatic duct through the tail with multiple calcifications.  This is stable measuring 4.3 x 1.9 cm.  The spleen is enlarged.  Multiple venous collaterals within the upper abdomen.  Kidneys are normal.  The stomach, small bowel, colon are unchanged.  There is postsurgical anatomy consistent with a left hemicolectomy.  There is a ventral abdominal hernia contains a loop of non obstructed small bowel.  No evidence of peritoneal disease.  No retroperitoneal periportal lymphadenopathy.  Prostate gland is small.  There are brachytherapy seeds noted.  The bladder is normal.  No pelvic lymphadenopathy.  Bilateral small inguinal hernias.  Review of the bone windows demonstrates no aggressive osseous lesions.  IMPRESSION: 1.  No evidence metastasis in the abdomen or pelvis. 2.  Multiple venous collaterals and splenomegaly consistent with portal hypertension.  Partial thrombosis of the portal vein is stable.  3.  Chronic dilatation of the pancreatic tail with multiple calcifications.   Original Report Authenticated By: Genevive Bi, M.D.    Ct Abdomen Pelvis W Contrast  11/24/2012  *RADIOLOGY REPORT*  Clinical Data:  Stage III tonsil carcinoma diagnosed 2009. Adenocarcinoma of prostate gland diagnosed 2009.  Colon adenocarcinoma diagnosed 2010.  CT CHEST, ABDOMEN AND PELVIS WITH CONTRAST  Technique:  Multidetector CT imaging of the chest, abdomen and pelvis was performed following the standard protocol during bolus administration of intravenous contrast.  Contrast: OMNIPAQUE IOHEXOL 300 MG/ML  SOLN  Comparison:  CT 05/23/2012  CT CHEST  Findings:  No axillary or supraclavicular lymphadenopathy.  No mediastinal or hilar lymphadenopathy.  No pericardial fluid.  There are no esophageal varices noted.  No suspicious pulmonary nodules.  IMPRESSION: No evidence of thoracic metastasis.  CT ABDOMEN AND  PELVIS  Findings:  No focal hepatic lesion. Stable thrombus within the portal vein.  The gallbladder, pancreas, adrenal glands are unchanged.  There is dilatation of the pancreatic duct through the tail with multiple calcifications.  This is stable measuring 4.3 x 1.9 cm.  The spleen is enlarged.  Multiple venous collaterals within the upper abdomen.  Kidneys are normal.  The stomach, small bowel, colon are unchanged.  There is postsurgical anatomy consistent with a left hemicolectomy.  There is a ventral abdominal hernia contains a loop of non obstructed small bowel.  No evidence of peritoneal disease.  No retroperitoneal periportal lymphadenopathy.  Prostate  gland is small.  There are brachytherapy seeds noted.  The bladder is normal.  No pelvic lymphadenopathy.  Bilateral small inguinal hernias.  Review of the bone windows demonstrates no aggressive osseous lesions.  IMPRESSION: 1.  No evidence metastasis in the abdomen or pelvis. 2.  Multiple venous collaterals and splenomegaly consistent with portal hypertension.  Partial thrombosis of the portal vein is stable.  3.  Chronic dilatation of the pancreatic tail with multiple calcifications.   Original Report Authenticated By: Genevive Bi, M.D.     ASSESSMENT: This is a very pleasant 71 years old [place] American male with multiple malignancies including history of tonsillar cancer, colon adenocarcinoma as well as prostate cancer. The patient also has a history of hepatitis, liver cirrhosis, gastrointestinal bleed as well as pancytopenia and mesenteric vein thrombosis.  He is doing fine and he has no evidence for disease progression on his recent scan.  PLAN: I discussed the scan results with the patient. I recommended for him to continue on observation with repeat CT scan of the neck, chest, abdomen and pelvis in 12 months. He was advised to call me immediately if he has any concerning symptoms in the interval.  All questions were answered. The patient  knows to call the clinic with any problems, questions or concerns. We can certainly see the patient much sooner if necessary.

## 2012-11-26 NOTE — Telephone Encounter (Signed)
gv and printed pt appt schedule for Nov 26.2014....gv pt barium and advised that central scheduling will contact him with d/t of ct.

## 2012-11-28 NOTE — Patient Instructions (Signed)
No evidence for disease progression on his recent scan. Followup in one year with repeat CT scan.

## 2012-12-02 ENCOUNTER — Ambulatory Visit (AMBULATORY_SURGERY_CENTER): Payer: Medicare Other | Admitting: *Deleted

## 2012-12-02 VITALS — Ht 72.0 in | Wt 191.8 lb

## 2012-12-02 DIAGNOSIS — Z85038 Personal history of other malignant neoplasm of large intestine: Secondary | ICD-10-CM

## 2012-12-02 DIAGNOSIS — Z1211 Encounter for screening for malignant neoplasm of colon: Secondary | ICD-10-CM

## 2012-12-02 MED ORDER — PEG-KCL-NACL-NASULF-NA ASC-C 100 G PO SOLR: ORAL | Status: DC

## 2012-12-02 NOTE — Progress Notes (Addendum)
No allergies to eggs or soy products  Information about appointmate given to patient  Note sent to Cathlyn Parsons CRNA to review pt's hx since he has had tonsillar CA in 2009

## 2012-12-08 ENCOUNTER — Telehealth: Payer: Self-pay | Admitting: Dietician

## 2012-12-09 NOTE — Telephone Encounter (Signed)
Following up on food intake and blood sugars: weight is within normal- he is eating 3 light meals a day and 2 cans ensure a day. He reports blood sugar before bed are consistently 200s and 300s. I asked him to check it a few times in the morning. No follow up appointment scheduled as yet. Will forward this note to his physician to see when he should follow up.  (A1c due in January 2014)

## 2012-12-16 ENCOUNTER — Encounter: Payer: Self-pay | Admitting: Internal Medicine

## 2012-12-16 ENCOUNTER — Ambulatory Visit (AMBULATORY_SURGERY_CENTER): Payer: Medicare Other | Admitting: Internal Medicine

## 2012-12-16 VITALS — BP 142/87 | HR 57 | Temp 95.1°F | Resp 21 | Ht 72.0 in | Wt 191.0 lb

## 2012-12-16 DIAGNOSIS — D126 Benign neoplasm of colon, unspecified: Secondary | ICD-10-CM

## 2012-12-16 DIAGNOSIS — Z1211 Encounter for screening for malignant neoplasm of colon: Secondary | ICD-10-CM

## 2012-12-16 DIAGNOSIS — Z85038 Personal history of other malignant neoplasm of large intestine: Secondary | ICD-10-CM

## 2012-12-16 LAB — GLUCOSE, CAPILLARY
Glucose-Capillary: 112 mg/dL — ABNORMAL HIGH (ref 70–99)
Glucose-Capillary: 98 mg/dL (ref 70–99)

## 2012-12-16 MED ORDER — SODIUM CHLORIDE 0.9 % IV SOLN: 500.0000 mL | INTRAVENOUS | Status: DC

## 2012-12-16 NOTE — Patient Instructions (Addendum)
Impressions/recommendations:  Polyp (handout given) Diverticulosis (handout given) High Fiber diet (handout given)  Repeat colonoscopy in 3 years.  YOU HAD AN ENDOSCOPIC PROCEDURE TODAY AT THE  ENDOSCOPY CENTER: Refer to the procedure report that was given to you for any specific questions about what was found during the examination.  If the procedure report does not answer your questions, please call your gastroenterologist to clarify.  If you requested that your care partner not be given the details of your procedure findings, then the procedure report has been included in a sealed envelope for you to review at your convenience later.  YOU SHOULD EXPECT: Some feelings of bloating in the abdomen. Passage of more gas than usual.  Walking can help get rid of the air that was put into your GI tract during the procedure and reduce the bloating. If you had a lower endoscopy (such as a colonoscopy or flexible sigmoidoscopy) you may notice spotting of blood in your stool or on the toilet paper. If you underwent a bowel prep for your procedure, then you may not have a normal bowel movement for a few days.  DIET: Your first meal following the procedure should be a light meal and then it is ok to progress to your normal diet.  A half-sandwich or bowl of soup is an example of a good first meal.  Heavy or fried foods are harder to digest and may make you feel nauseous or bloated.  Likewise meals heavy in dairy and vegetables can cause extra gas to form and this can also increase the bloating.  Drink plenty of fluids but you should avoid alcoholic beverages for 24 hours.  ACTIVITY: Your care partner should take you home directly after the procedure.  You should plan to take it easy, moving slowly for the rest of the day.  You can resume normal activity the day after the procedure however you should NOT DRIVE or use heavy machinery for 24 hours (because of the sedation medicines used during the test).     SYMPTOMS TO REPORT IMMEDIATELY: A gastroenterologist can be reached at any hour.  During normal business hours, 8:30 AM to 5:00 PM Monday through Friday, call [phone].  After hours and on weekends, please call the GI answering service at [phone] who will take a message and have the physician on call contact you.   Following lower endoscopy (colonoscopy or flexible sigmoidoscopy):  Excessive amounts of blood in the stool  Significant tenderness or worsening of abdominal pains  Swelling of the abdomen that is new, acute  Fever of 100F or higher   FOLLOW UP: If any biopsies were taken you will be contacted by phone or by letter within the next 1-3 weeks.  Call your gastroenterologist if you have not heard about the biopsies in 3 weeks.  Our staff will call the home number listed on your records the next business day following your procedure to check on you and address any questions or concerns that you may have at that time regarding the information given to you following your procedure. This is a courtesy call and so if there is no answer at the home number and we have not heard from you through the emergency physician on call, we will assume that you have returned to your regular daily activities without incident.  SIGNATURES/CONFIDENTIALITY: You and/or your care partner have signed paperwork which will be entered into your electronic medical record.  These signatures attest to the fact that that the  information above on your After Visit Summary has been reviewed and is understood.  Full responsibility of the confidentiality of this discharge information lies with you and/or your care-partner.

## 2012-12-16 NOTE — Op Note (Signed)
[facility] [address] [place], [address]   COLONOSCOPY PROCEDURE REPORT  PATIENT: Carol, Loftin  MR#: [id number] BIRTHDATE: [birth year] , 71  yrs. old GENDER: Male ENDOSCOPIST: Roxy Cedar, MD REFERRED [id number] Program Recall PROCEDURE DATE:  12/16/2012 PROCEDURE:   Colonoscopy with snare polypectomy    x 1 ASA CLASS:   Class III INDICATIONS:High risk patient with personal history of colon cancer. Resected 10-2009 (T3N0M0); f/u 10-2010 MEDICATIONS: MAC sedation, administered by CRNA and propofol (Diprivan) 200mg  IV  DESCRIPTION OF PROCEDURE:   After the risks benefits and alternatives of the procedure were thoroughly explained, informed consent was obtained.  A digital rectal exam revealed no abnormalities of the rectum.   The LB [id number]  endoscope was introduced through the anus and advanced to the cecum, which was identified by both the appendix and ileocecal valve. No adverse events experienced.   The quality of the prep was good, using MoviPrep  The instrument was then slowly withdrawn as the colon was fully examined.      COLON FINDINGS: A diminutive polyp was found in the ascending colon. A polypectomy was performed with a cold snare.  The resection was complete and the polyp tissue was completely retrieved.   Moderate diverticulosis was noted throughout the entire examined colon. There was evidence of a prior surgical anastomosis in the descending colon at 40cm.   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  Retroflexed views revealed internal hemorrhoids. The time to cecum=1 minutes 49 seconds.  Withdrawal time=9 minutes 14 seconds.  The scope was withdrawn and the procedure completed. COMPLICATIONS: There were no complications.  ENDOSCOPIC IMPRESSION: 1.   Diminutive polyp was found in the ascending colon; polypectomy was performed with a cold snare 2.   Moderate diverticulosis was scattered  throughout the entire examined colon 3.   There was evidence of a prior surgical anastomosis in the descending colon at 40cm 4.   Radiation proctitis  RECOMMENDATIONS: Repeat Colonoscopy in 3 years.   eSigned:  Roxy Cedar, MD 12/16/2012 1:08 PM  cc: The Patient   , [facility]   PATIENT NAME:  Ernest, Popowski MR#: [id number]

## 2012-12-16 NOTE — Telephone Encounter (Signed)
I apologize for the delayed response.  Follow-up in 1-4 weeks, preferably with me, would be sufficient.  Thank you.

## 2012-12-16 NOTE — Progress Notes (Signed)
Patient did not experience any of the following events: a burn prior to discharge; a fall within the facility; wrong site/side/patient/procedure/implant event; or a hospital transfer or hospital admission upon discharge from the facility. (G8907) Patient did not have preoperative order for IV antibiotic SSI prophylaxis. (G8918)  

## 2012-12-16 NOTE — Progress Notes (Signed)
Called to room to assist during endoscopic procedure.  Patient ID and intended procedure confirmed with present staff. Received instructions for my participation in the procedure from the performing physician.  

## 2012-12-16 NOTE — Telephone Encounter (Signed)
Will ask front office to schedule with dr. Manson Passey.

## 2012-12-17 ENCOUNTER — Telehealth: Payer: Self-pay

## 2012-12-17 NOTE — Telephone Encounter (Signed)
  Follow up Call-  Call back number 12/16/2012  Post procedure Call Back phone  # [phone] VOICE MAIL  Permission to leave phone message No     Patient questions:  Do you have a fever, pain , or abdominal swelling? no Pain Score  0 *  Have you tolerated food without any problems? yes  Have you been able to return to your normal activities? yes  Do you have any questions about your discharge instructions: Diet   no Medications  no Follow up visit  no  Do you have questions or concerns about your Care? no  Actions: * If pain score is 4 or above: No action needed, pain <4.

## 2012-12-22 ENCOUNTER — Encounter: Payer: Self-pay | Admitting: Internal Medicine

## 2012-12-26 ENCOUNTER — Encounter: Payer: Self-pay | Admitting: Internal Medicine

## 2013-01-07 ENCOUNTER — Encounter: Payer: Medicare Other | Admitting: Internal Medicine

## 2013-01-28 ENCOUNTER — Encounter: Payer: Self-pay | Admitting: Internal Medicine

## 2013-01-28 ENCOUNTER — Ambulatory Visit (INDEPENDENT_AMBULATORY_CARE_PROVIDER_SITE_OTHER): Payer: Medicare Other | Admitting: Internal Medicine

## 2013-01-28 VITALS — BP 137/85 | HR 66 | Temp 97.0°F | Ht 72.0 in | Wt 197.4 lb

## 2013-01-28 DIAGNOSIS — I864 Gastric varices: Secondary | ICD-10-CM

## 2013-01-28 DIAGNOSIS — E119 Type 2 diabetes mellitus without complications: Secondary | ICD-10-CM

## 2013-01-28 DIAGNOSIS — Z79899 Other long term (current) drug therapy: Secondary | ICD-10-CM

## 2013-01-28 DIAGNOSIS — N182 Chronic kidney disease, stage 2 (mild): Secondary | ICD-10-CM | POA: Insufficient documentation

## 2013-01-28 DIAGNOSIS — I10 Essential (primary) hypertension: Secondary | ICD-10-CM

## 2013-01-28 LAB — LIPID PANEL
Cholesterol: 199 mg/dL (ref 0–200)
HDL: 71 mg/dL (ref >39)
Triglycerides: 147 mg/dL (ref <150)

## 2013-01-28 LAB — POCT GLYCOSYLATED HEMOGLOBIN (HGB A1C): Hemoglobin A1C: 7.4

## 2013-01-28 LAB — GLUCOSE, CAPILLARY: Glucose-Capillary: 206 mg/dL — ABNORMAL HIGH (ref 70–99)

## 2013-01-28 MED ORDER — INSULIN GLARGINE 100 UNIT/ML ~~LOC~~ SOLN: SUBCUTANEOUS | Status: DC

## 2013-01-28 MED ORDER — NADOLOL 80 MG PO TABS: 80.0000 mg | ORAL_TABLET | Freq: Every day | ORAL | Status: DC

## 2013-01-28 NOTE — Assessment & Plan Note (Signed)
BP well controlled, continue current regimen. 

## 2013-01-28 NOTE — Assessment & Plan Note (Signed)
The patient has a history of poorly-controlled DM.  CBG's and A1C have improved since last visit, though they are still elevated.  No hypoglycemia. -increase Lantus from 38 to 43 units qhs -patient notes recent eye exam.  Called Dr. Jacelyn Pi office, they're faxing over his last visit. -checking lipid panel today

## 2013-01-28 NOTE — Patient Instructions (Signed)
General Instructions: For your diabetes, increase your Lantus (long-acting) insulin to 43 units every night.  We are checking your cholesterol today, and will call you if the results are abnormal.  Please return for a follow-up visit in 3 months.   Treatment Goals:  Goals (1 Years of Data) as of 01/28/2013          As of Today 12/16/12 12/16/12 12/16/12 12/16/12     Blood Pressure    . Blood Pressure < 140/90  137/85 142/87 132/81 112/83 114/64     Diet    . Decrease  juice intake          . Reduce portion size               Note created  11/14/2012  2:10 PM by Cecil Cranker Plyler, RD    Of sherbet          Result Component    . HEMOGLOBIN A1C < 7.0  7.4        . LDL CALC < 100            Progress Toward Treatment Goals:  Treatment Goal 01/28/2013  Hemoglobin A1C improved  Blood pressure at goal    Self Care Goals & Plans:  Self Care Goal 01/28/2013  Manage my medications take my medicines as prescribed; bring my medications to every visit; refill my medications on time  Monitor my health keep track of my blood glucose; bring my glucose meter and log to each visit  Eat healthy foods drink diet soda or water instead of juice or soda; eat more vegetables; eat foods that are low in salt    Home Blood Glucose Monitoring 01/28/2013  Check my blood sugar once a day  When to check my blood sugar before meals     Care Management & Community Referrals:  Referral 01/28/2013  Referrals made for care management support none needed

## 2013-01-28 NOTE — Progress Notes (Signed)
HPI The patient is a 72 y.o. male with a history of multiple cancers, DM, cirrhosis, HTN, presenting for a follow-up visit.  The patient has a history of poorly-controlled DM.  He is currently taking Lantus 38 units qhs, increased at his last visit 2 months ago.  Glucometer report reveals: Range: 151-305 Average: 234 Avg tests/day: 0.7 Although high, these numbers appear to be improved since his last visit.  The patient's A1C today has decreased from 9.0 to 7.4 (though this is likely somewhat falsely lowered given his history of cirrhosis).  The patient notes occasional blurry vision with hyperglycemia, but no diaphoresis, tremulousness, AMS, or known hypoglycemia.  His last eye exam was 3 months ago with Dr. Mitzi Davenport.  The patient has a history of HTN.  BP well-controlled at today's visit.  The patient's last colonscopy was 12/16/12.  The patient continues to follow with his oncologist as well.  ROS: General: no fevers, chills, changes in weight, changes in appetite Skin: no rash HEENT: no blurry vision, hearing changes, sore throat Pulm: no dyspnea, coughing, wheezing CV: no chest pain, palpitations, shortness of breath Abd: no abdominal pain, nausea/vomiting, diarrhea/constipation GU: no dysuria, hematuria, polyuria Ext: no arthralgias, myalgias Neuro: no weakness, numbness, or tingling  Filed Vitals:   01/28/13 1543  BP: 137/85  Pulse: 66  Temp: 97 F (36.1 C)    PEX General: alert, cooperative, and in no apparent distress HEENT: pupils equal round and reactive to light, vision grossly intact, oropharynx clear and non-erythematous  Neck: supple, no lymphadenopathy Lungs: clear to ascultation bilaterally, normal work of respiration, no wheezes, rales, ronchi Heart: regular rate and rhythm, no murmurs, gallops, or rubs Abdomen: soft, non-tender, non-distended, normal bowel sounds Extremities: no cyanosis, clubbing, or edema Neurologic: alert & oriented X3, cranial nerves  II-XII intact, strength grossly intact, sensation intact to light touch  Current Outpatient Prescriptions on File Prior to Visit  Medication Sig Dispense Refill  . ACCU-CHEK FASTCLIX LANCETS MISC 1 each by Does not apply route 2 (two) times daily.  102 each  6  . ferrous sulfate 325 (65 FE) MG tablet Take 1 tablet (325 mg total) by mouth 2 (two) times daily.  30 tablet  11  . folic acid (FOLVITE) 1 MG tablet Take 1 tablet (1 mg total) by mouth daily.  100 tablet  3  . glucose blood (ACCU-CHEK SMARTVIEW) test strip Use as instructed  100 each  12  . insulin glargine (LANTUS) 100 UNIT/ML injection Inject 38 units into the skin subcutaneously at night.  10 mL  11  . lisinopril (PRINIVIL,ZESTRIL) 10 MG tablet Take 1 tablet (10 mg total) by mouth daily.  30 tablet  11  . Multiple Vitamin (MULTIVITAMIN) tablet Take 1 tablet by mouth daily.  30 tablet  3  . nadolol (CORGARD) 80 MG tablet Take 1 tablet (80 mg total) by mouth daily.  15 tablet  11  . neomycin-bacitracin-polymyxin (NEOSPORIN) ointment Apply topically every 12 (twelve) hours. apply to the affected area as directed.  15 g  0  . omeprazole (PRILOSEC) 40 MG capsule Take 1 capsule (40 mg total) by mouth daily.  30 capsule  11    Assessment/Plan

## 2013-03-21 ENCOUNTER — Inpatient Hospital Stay (HOSPITAL_COMMUNITY)
Admission: EM | Admit: 2013-03-21 | Discharge: 2013-03-22 | DRG: 378 | Disposition: A | Discharge status: Home / Self Care | Payer: Medicare Other | Attending: Internal Medicine | Admitting: Internal Medicine

## 2013-03-21 ENCOUNTER — Encounter (HOSPITAL_COMMUNITY): Payer: Self-pay | Admitting: Nurse Practitioner

## 2013-03-21 DIAGNOSIS — N182 Chronic kidney disease, stage 2 (mild): Secondary | ICD-10-CM

## 2013-03-21 DIAGNOSIS — K861 Other chronic pancreatitis: Secondary | ICD-10-CM | POA: Diagnosis present

## 2013-03-21 DIAGNOSIS — F1020 Alcohol dependence, uncomplicated: Secondary | ICD-10-CM | POA: Diagnosis present

## 2013-03-21 DIAGNOSIS — K3184 Gastroparesis: Secondary | ICD-10-CM | POA: Diagnosis present

## 2013-03-21 DIAGNOSIS — I498 Other specified cardiac arrhythmias: Secondary | ICD-10-CM | POA: Diagnosis not present

## 2013-03-21 DIAGNOSIS — I10 Essential (primary) hypertension: Secondary | ICD-10-CM | POA: Diagnosis present

## 2013-03-21 DIAGNOSIS — K2210 Ulcer of esophagus without bleeding: Secondary | ICD-10-CM | POA: Diagnosis present

## 2013-03-21 DIAGNOSIS — B1920 Unspecified viral hepatitis C without hepatic coma: Secondary | ICD-10-CM | POA: Diagnosis present

## 2013-03-21 DIAGNOSIS — E860 Dehydration: Secondary | ICD-10-CM | POA: Diagnosis present

## 2013-03-21 DIAGNOSIS — I864 Gastric varices: Secondary | ICD-10-CM

## 2013-03-21 DIAGNOSIS — Z794 Long term (current) use of insulin: Secondary | ICD-10-CM

## 2013-03-21 DIAGNOSIS — B1710 Acute hepatitis C without hepatic coma: Secondary | ICD-10-CM | POA: Diagnosis present

## 2013-03-21 DIAGNOSIS — K219 Gastro-esophageal reflux disease without esophagitis: Secondary | ICD-10-CM | POA: Diagnosis present

## 2013-03-21 DIAGNOSIS — I8510 Secondary esophageal varices without bleeding: Secondary | ICD-10-CM

## 2013-03-21 DIAGNOSIS — K2970 Gastritis, unspecified, without bleeding: Secondary | ICD-10-CM | POA: Diagnosis present

## 2013-03-21 DIAGNOSIS — K449 Diaphragmatic hernia without obstruction or gangrene: Secondary | ICD-10-CM | POA: Diagnosis present

## 2013-03-21 DIAGNOSIS — E119 Type 2 diabetes mellitus without complications: Secondary | ICD-10-CM

## 2013-03-21 DIAGNOSIS — K7460 Unspecified cirrhosis of liver: Secondary | ICD-10-CM | POA: Diagnosis present

## 2013-03-21 DIAGNOSIS — R5383 Other fatigue: Secondary | ICD-10-CM | POA: Diagnosis present

## 2013-03-21 DIAGNOSIS — E1165 Type 2 diabetes mellitus with hyperglycemia: Secondary | ICD-10-CM

## 2013-03-21 DIAGNOSIS — E1149 Type 2 diabetes mellitus with other diabetic neurological complication: Secondary | ICD-10-CM | POA: Diagnosis present

## 2013-03-21 DIAGNOSIS — Z85819 Personal history of malignant neoplasm of unspecified site of lip, oral cavity, and pharynx: Secondary | ICD-10-CM

## 2013-03-21 DIAGNOSIS — R634 Abnormal weight loss: Secondary | ICD-10-CM | POA: Diagnosis present

## 2013-03-21 DIAGNOSIS — Z6825 Body mass index (BMI) 25.0-25.9, adult: Secondary | ICD-10-CM

## 2013-03-21 DIAGNOSIS — Z8546 Personal history of malignant neoplasm of prostate: Secondary | ICD-10-CM

## 2013-03-21 DIAGNOSIS — K2990 Gastroduodenitis, unspecified, without bleeding: Secondary | ICD-10-CM | POA: Diagnosis present

## 2013-03-21 DIAGNOSIS — Z85038 Personal history of other malignant neoplasm of large intestine: Secondary | ICD-10-CM

## 2013-03-21 DIAGNOSIS — IMO0002 Reserved for concepts with insufficient information to code with codable children: Secondary | ICD-10-CM | POA: Diagnosis present

## 2013-03-21 DIAGNOSIS — K7030 Alcoholic cirrhosis of liver without ascites: Secondary | ICD-10-CM | POA: Diagnosis present

## 2013-03-21 DIAGNOSIS — E8809 Other disorders of plasma-protein metabolism, not elsewhere classified: Secondary | ICD-10-CM | POA: Diagnosis present

## 2013-03-21 DIAGNOSIS — K922 Gastrointestinal hemorrhage, unspecified: Secondary | ICD-10-CM

## 2013-03-21 DIAGNOSIS — K2100 Gastro-esophageal reflux disease with esophagitis, without bleeding: Secondary | ICD-10-CM | POA: Diagnosis present

## 2013-03-21 DIAGNOSIS — R5381 Other malaise: Secondary | ICD-10-CM | POA: Diagnosis present

## 2013-03-21 DIAGNOSIS — D649 Anemia, unspecified: Secondary | ICD-10-CM

## 2013-03-21 DIAGNOSIS — D696 Thrombocytopenia, unspecified: Secondary | ICD-10-CM | POA: Diagnosis present

## 2013-03-21 DIAGNOSIS — K766 Portal hypertension: Secondary | ICD-10-CM | POA: Diagnosis present

## 2013-03-21 LAB — CBC
MCH: 35.2 pg — ABNORMAL HIGH (ref 26.0–34.0)
MCHC: 35.2 g/dL (ref 30.0–36.0)
MCV: 100 fL (ref 78.0–100.0)
Platelets: 52 10*3/uL — ABNORMAL LOW (ref 150–400)
Platelets: 43 10*3/uL — ABNORMAL LOW (ref 150–400)
RBC: 3.22 MIL/uL — ABNORMAL LOW (ref 4.22–5.81)
RDW: 13.5 % (ref 11.5–15.5)
WBC: 4.5 10*3/uL (ref 4.0–10.5)

## 2013-03-21 LAB — URINALYSIS, MICROSCOPIC ONLY
Bilirubin Urine: NEGATIVE
Hgb urine dipstick: NEGATIVE
Specific Gravity, Urine: 1.02 (ref 1.005–1.030)
pH: 7 (ref 5.0–8.0)

## 2013-03-21 LAB — OCCULT BLOOD, POC DEVICE: Fecal Occult Bld: POSITIVE — AB

## 2013-03-21 LAB — COMPREHENSIVE METABOLIC PANEL
BUN: 38 mg/dL — ABNORMAL HIGH (ref 6–23)
CO2: 23 mEq/L (ref 19–32)
Chloride: 101 mEq/L (ref 96–112)
Creatinine, Ser: 1.23 mg/dL (ref 0.50–1.35)
Glucose, Bld: 120 mg/dL — ABNORMAL HIGH (ref 70–99)
Potassium: 4.6 mEq/L (ref 3.5–5.1)

## 2013-03-21 LAB — CBC WITH DIFFERENTIAL/PLATELET
HCT: 32.8 % — ABNORMAL LOW (ref 39.0–52.0)
Hemoglobin: 11.5 g/dL — ABNORMAL LOW (ref 13.0–17.0)
Lymphs Abs: 1.2 10*3/uL (ref 0.7–4.0)
Monocytes Absolute: 0.7 10*3/uL (ref 0.1–1.0)
Monocytes Relative: 16 % — ABNORMAL HIGH (ref 3–12)
Neutro Abs: 2.6 10*3/uL (ref 1.7–7.7)
Neutrophils Relative %: 54 % (ref 43–77)
RBC: 3.31 MIL/uL — ABNORMAL LOW (ref 4.22–5.81)

## 2013-03-21 LAB — ETHANOL: Alcohol, Ethyl (B): <11 mg/dL (ref 0–11)

## 2013-03-21 LAB — TYPE AND SCREEN: ABO/RH(D): B POS

## 2013-03-21 LAB — PROTIME-INR: INR: 1.23 (ref 0.00–1.49)

## 2013-03-21 MED ORDER — PANTOPRAZOLE SODIUM 40 MG IV SOLR
40.0000 mg | Freq: Two times a day (BID) | INTRAVENOUS | Status: DC
  Administered 2013-03-21 – 2013-03-22 (×3): 40 mg via INTRAVENOUS
  Filled 2013-03-21 (×4): qty 40

## 2013-03-21 MED ORDER — INSULIN ASPART 100 UNIT/ML ~~LOC~~ SOLN: 0.0000 [IU] | Freq: Three times a day (TID) | SUBCUTANEOUS | Status: DC

## 2013-03-21 MED ORDER — PANTOPRAZOLE SODIUM 40 MG IV SOLR
40.0000 mg | Freq: Once | INTRAVENOUS | Status: AC
  Administered 2013-03-21: 40 mg via INTRAVENOUS
  Filled 2013-03-21: qty 40

## 2013-03-21 MED ORDER — ACETAMINOPHEN 650 MG RE SUPP: 650.0000 mg | Freq: Four times a day (QID) | RECTAL | Status: DC | PRN

## 2013-03-21 MED ORDER — SODIUM CHLORIDE 0.9 % IV SOLN
25.0000 ug/h | INTRAVENOUS | Status: DC
  Administered 2013-03-21: 50 ug/h via INTRAVENOUS
  Administered 2013-03-22: 25 ug/h via INTRAVENOUS
  Filled 2013-03-21 (×4): qty 1

## 2013-03-21 MED ORDER — SODIUM CHLORIDE 0.9 % IV SOLN
INTRAVENOUS | Status: DC
  Administered 2013-03-21 – 2013-03-22 (×2): via INTRAVENOUS

## 2013-03-21 MED ORDER — PANTOPRAZOLE SODIUM 40 MG IV SOLR: 40.0000 mg | Freq: Once | INTRAVENOUS | Status: DC

## 2013-03-21 MED ORDER — SODIUM CHLORIDE 0.9 % IJ SOLN
3.0000 mL | Freq: Two times a day (BID) | INTRAMUSCULAR | Status: DC
  Administered 2013-03-21: 3 mL via INTRAVENOUS

## 2013-03-21 MED ORDER — ACETAMINOPHEN 325 MG PO TABS: 650.0000 mg | ORAL_TABLET | Freq: Four times a day (QID) | ORAL | Status: DC | PRN

## 2013-03-21 MED ORDER — SODIUM CHLORIDE 0.45 % IV SOLN
INTRAVENOUS | Status: AC
  Administered 2013-03-21: 18:00:00 via INTRAVENOUS

## 2013-03-21 NOTE — H&P (Signed)
I have reviewed H&P by Rayetta Humphrey, MSIV. Please see my additional note for further thoughts.

## 2013-03-21 NOTE — ED Notes (Signed)
Pt reports 2 nights ago he vomited and noticed some blood in it. Reports nausea has persisted since. Reports he has been lethargic and losing weight for past week. A&Ox4, resp e/u

## 2013-03-21 NOTE — Progress Notes (Signed)
Patient's HR 50's receiving Octreotide IV  Infusion. MD notifies. We will continue to monitor HR.

## 2013-03-21 NOTE — H&P (Signed)
Hospital Admission Note Date: 03/21/2013  Patient name: Eddie [name] Medical record number: [id number] Date of birth: 1941/04/05 Age: 72 y.o. Gender: male PCP: [name], MD  Medical Service: Internal Medicine Teaching Service - [facility]  Attending physician: Dr. Criselda Peaches    1st Contact: Rayetta Humphrey, MSIV Pager: [phone] 2nd Contact: Stacy Gardner, MD  Pager: [phone] After 5 pm or weekends: 1st Contact:      Pager: [phone] 2nd Contact:      Pager: [phone]  Chief Complaint: Vomiting  History of Present Illness: 72 yo M with hisotry of gastritis, esophagitis with varices, cirrhosis, hep C, and pancreatitis presents on 03/21/13 with complaints of nausea & abdominal pain 2 days ago followed by a dark, tarry bowel movement and then 1 episode of dark bloody emesis.  No BM or emesis since then.  Abdominal pain described as left upper quadrant pain then radiated to his epigastric region- felt like an upset stomach, possibly crampy, 5-6/10, currently 2/10.  Felt better after BM.  Not usually constipated. Takes iron daily. Usually has a BM daily.  He reports generalized weakness & weight less.  He has tried pepto-bismol, milk of magnesia and tums for relief after he noticed black bowel mvmts and dark emesis.   He hasn't eaten in 2 days (except glucerna in the ED).  No appetite so cannot correlate to food. Was able to drink water well yesterday.   No dizziness, blurry vision, shortness of breath or chest pain.   Only takes medications that the MD prescribes, no ibuprofen/asa/EtOH.   Of note, he underwent EGD on 02/15/12 which revealed portal gastropathy, gastric varices, gastric ulcer, grade II esophageal varices, a small hiatal hernia and exudate in the proximal esophagus.  Review of meds suggest he is taking all of his medications, but he only has nadolol and lisinopril with him today.   Meds: Current Outpatient Rx  Name  Route  Sig  Dispense  Refill  . ferrous sulfate 325 (65 FE) MG  tablet   Oral   Take 325 mg by mouth 2 (two) times daily with a meal.         . insulin glargine (LANTUS) 100 UNIT/ML injection      Inject 43 units into the skin subcutaneously at night.   10 mL   11   . lisinopril (PRINIVIL,ZESTRIL) 10 MG tablet   Oral   Take 1 tablet (10 mg total) by mouth daily.   30 tablet   11   . Multiple Vitamin (MULTIVITAMIN) tablet   Oral   Take 1 tablet by mouth daily.   30 tablet   3   . nadolol (CORGARD) 80 MG tablet   Oral   Take 1 tablet (80 mg total) by mouth daily.   30 tablet   11   . ACCU-CHEK FASTCLIX LANCETS MISC   Does not apply   1 each by Does not apply route 2 (two) times daily.   102 each   6     Use to check blood sugars up to twice daily   . glucose blood (ACCU-CHEK SMARTVIEW) test strip      Use as instructed   100 each   12     Allergies: Allergies as of 03/21/2013  . (No Known Allergies)   Past Medical History  Diagnosis Date  . Cancer     Status post curative radiotherapy with IMRT and androgen ablation for the prostate adenocarcinoma, completed December 10, 2008.  Marland Kitchen Cancer of colon  Status post left hemicolectomy at [facility] in November of 2010 revealing T3 N0 M0 colon adenocarcinoma and the patient did not receive adjuvant chemotherapy  . 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  Marland Kitchen Upper GI bleeding     EGD on 01/2011 shows esophagitis, grade 1 varices  . Hypertension   . Cirrhosis   . Anemia     bone marrow biopsy 11/2011,   . Diabetes mellitus   . Hepatitis C   . Gallstone   . Thrush, oral   . GERD (gastroesophageal reflux disease)   . DM gastroparesis   . Gastritis   . Hernia   . Pancreatitis   . Pancytopenia   . Cataract    Past Surgical History  Procedure Laterality Date  . Colon surgery    . Hernia repair    . Esophagogastroduodenoscopy  02/15/2012     Procedure: ESOPHAGOGASTRODUODENOSCOPY (EGD);  Surgeon: Eliezer Bottom., MD,FACG;  Location: [facility] ENDOSCOPY;  Service: Endoscopy;  Laterality: N/A;   Family History  Problem Relation Age of Onset  . Breast cancer Mother     mother still living and father passed with unsure reason.  . Alzheimer's disease Mother   . Colon cancer Neg Hx   . Esophageal cancer Neg Hx   . Stomach cancer Neg Hx   . Rectal cancer Neg Hx    History   Social History  . Marital Status: Single    Spouse Name: N/A    Number of Children: N/A  . Years of Education: N/A   Occupational History  . Not on file.   Social History Main Topics  . Smoking status: Never Smoker   . Smokeless tobacco: Never Used  . Alcohol Use: No     Comment: patient used to drink 1.5 bottle of wine plus some liquor daily for 50 + years and quit drinking 4 years ago  . Drug Use: No  . Sexually Active: Not Currently   Other Topics Concern  . Not on file   Social History Narrative   Lives with his mother at home. Patient is single and retired, receive SS check.    Review of Systems: General: no fevers, chills, +decreased weight and appetite Skin: no rash HEENT: no blurry vision, hearing changes, sore throat Pulm: no dyspnea, coughing, wheezing CV: no chest pain, palpitations, shortness of breath Abd: per HPI GU: no dysuria, hematuria, polyuria Ext: no arthralgias, myalgias Neuro: no numbness, or tingling   Physical Exam: Blood pressure 128/69, pulse 67, temperature 97.9 F (36.6 C), temperature source Oral, resp. rate 18, SpO2 99.00%. General: resting in bed, no acute distress HEENT: PERRL, EOMI, mild scleral icterus, dry mucous membranes with white exudate on tongue (patient reports he just drank glucerna, able to scrap a minimal amt off tongue) Cardiac: RRR, no rubs, murmurs or gallops Pulm: clear to auscultation bilaterally, moving normal volumes of air Abd: soft, nontender, nondistended, BS present Ext: warm and  well perfused, no pedal edema Skin: +tenting Neuro: alert and oriented X3, cranial nerves II-XII grossly intact  Lab results: Basic Metabolic Panel:  Recent Labs  21/30/86 1323  NA 134*  K 4.6  CL 101  CO2 23  GLUCOSE 120*  BUN 38*  CREATININE 1.23  CALCIUM 9.8   Liver Function Tests:  Recent Labs  03/21/13 1323  AST 54*  ALT 28  ALKPHOS 53  BILITOT  1.1  PROT 6.2  ALBUMIN 2.9*   CBC:  Recent Labs  03/21/13 1323  WBC 4.7  NEUTROABS 2.6  HGB 11.5*  HCT 32.8*  MCV 99.1  PLT 62*   Coagulation:  Recent Labs  03/21/13 1324  LABPROT 15.3*  INR 1.23   Urine Drug Screen: pending   Alcohol Level: pending   Urinalysis:  Recent Labs  03/21/13 1248  COLORURINE YELLOW  LABSPEC 1.020  PHURINE 7.0  GLUCOSEU NEGATIVE  HGBUR NEGATIVE  BILIRUBINUR NEGATIVE  KETONESUR NEGATIVE  PROTEINUR NEGATIVE  UROBILINOGEN 0.2  NITRITE NEGATIVE  LEUKOCYTESUR NEGATIVE    Assessment & Plan by Problem: 72 yo M with history of colon, prostate and tonsillar cancers, as well as DM, cirrhosis, HTN, gastritis, mesenteric vein thrombosis and pancreastitis and esophagitis who presents of 03/21/13 with dark emesis.  #Abdominal Pain with nausea, vomiting and tarry stools: Currently asymptomatic & hemodynamically stable, though FOBT+.  Given color of stool/emesis and patient's history, he is at risk for ulcer and/or esophageal variceal bleed. May be secondary to gastritis or pancreatitis given location of pain.  Patient is on iron which could constipate and darken stools, but this would not explain acute etiology of symptoms.   -Admit to telemetry -Consult GI given extensive history and most recent EGD - Discussed with Dr. Rhea Belton - will allow clear liquid diet & NPO after midnight -Type & screen -EGD may clarify if patient has thrush - will hold diflucan until after scope -Orthostatic vital signs -Lipase -Hydrate with IVF, hold antihypertensive medications -Protonix 40mg  IV BID,  Octreotide gtt given history of varices  #Normocytic anemia: Unclear baseline given recent bleed within the last year; Hb was 14 in November and May of last year.  FOBT +. 11/2012 colonoscopy with polyp (tubular adenoma), diverticulosis, radiation proctitis with plans to repeat in 3 years (does have history of colon ca).  Not classical presentation for lower GI bleed.  -Repeat CBC now and then in the morning -Ferritin, B12 and folate  #DM: Most recent A1c = 7.4 (01/28/13) -CBGs ACHS, SSI (home lantus currently held)  #Chronic Thrombocytopenia: unclear etiology, may be related to cirrhosis, smear in 02/14/12 with large platelets.  -check HIV status (negative 1 year prior)  #Hypoalbuminemia : likely related to poor PO intake in setting of chronic disease  #VTE: SCDs  Remainder per [facility]) note.  Dispo: Disposition is deferred at this time, awaiting improvement of current medical problems. Anticipated discharge in approximately 2-3 day(s).   The patient does have a current PCP (BROWN, RYAN, MD), therefore will be requiring OPC follow-up after discharge.   The patient does not have transportation limitations that hinder transportation to clinic appointments.  SignedStacy Gardner 03/21/2013, 3:23 PM

## 2013-03-21 NOTE — ED Provider Notes (Signed)
History     CSN: [id number]  Arrival date & time 03/21/13  1223   First MD Initiated Contact with Patient 03/21/13 1303      Chief Complaint  Patient presents with  . Emesis    (Consider location/radiation/quality/duration/timing/severity/associated sxs/prior treatment) HPI  Past Medical History  Diagnosis Date  . Cancer     Status post curative radiotherapy with IMRT and androgen ablation for the prostate adenocarcinoma, completed December 10, 2008.  Marland Kitchen Cancer of colon     Status post left hemicolectomy at [facility] in November of 2010 revealing T3 N0 M0 colon adenocarcinoma and the patient did not receive adjuvant chemotherapy  . 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  Marland Kitchen Upper GI bleeding     EGD on 01/2011 shows esophagitis, grade 1 varices  . Hypertension   . Cirrhosis   . Anemia     bone marrow biopsy 11/2011,   . Diabetes mellitus   . Hepatitis C   . Gallstone   . Thrush, oral   . GERD (gastroesophageal reflux disease)   . DM gastroparesis   . Gastritis   . Hernia   . Pancreatitis   . Pancytopenia   . Cataract     Past Surgical History  Procedure Laterality Date  . Colon surgery    . Hernia repair    . Esophagogastroduodenoscopy  02/15/2012    Procedure: ESOPHAGOGASTRODUODENOSCOPY (EGD);  Surgeon: Eliezer Bottom., MD,FACG;  Location: [facility] ENDOSCOPY;  Service: Endoscopy;  Laterality: N/A;    Family History  Problem Relation Age of Onset  . Breast cancer Mother     mother still living and father passed with unsure reason.  . Alzheimer's disease Mother   . Healthy Sister   . Colon cancer Neg Hx   . Esophageal cancer Neg Hx   . Stomach cancer Neg Hx   . Rectal cancer Neg Hx     History  Substance Use Topics  . Smoking status: Never Smoker   . Smokeless tobacco: Never Used  . Alcohol Use: No     Comment: patient used to  drink 1.5 bottle of wine plus some liquor daily for 50 + years and quit drinking 4 years ago      Review of Systems  Allergies  Review of patient's allergies indicates no known allergies.  Home Medications   Current Outpatient Rx  Name  Route  Sig  Dispense  Refill  . ferrous sulfate 325 (65 FE) MG tablet   Oral   Take 325 mg by mouth 2 (two) times daily with a meal.         . insulin glargine (LANTUS) 100 UNIT/ML injection      Inject 43 units into the skin subcutaneously at night.   10 mL   11   . lisinopril (PRINIVIL,ZESTRIL) 10 MG tablet   Oral   Take 1 tablet (10 mg total) by mouth daily.   30 tablet   11   . Multiple Vitamin (MULTIVITAMIN) tablet   Oral   Take 1 tablet by mouth daily.   30 tablet   3   . nadolol (CORGARD) 80 MG tablet   Oral   Take 1 tablet (80 mg total) by mouth daily.   30 tablet   11   . ACCU-CHEK FASTCLIX LANCETS MISC   Does not apply   1 each by Does  not apply route 2 (two) times daily.   102 each   6     Use to check blood sugars up to twice daily   . glucose blood (ACCU-CHEK SMARTVIEW) test strip      Use as instructed   100 each   12     BP 128/69  Pulse 67  Temp(Src) 97.9 F (36.6 C) (Oral)  Resp 18  SpO2 99%  Physical Exam  ED Course  Procedures (including critical care time)  Labs Reviewed  CBC WITH DIFFERENTIAL  COMPREHENSIVE METABOLIC PANEL  LIPASE, BLOOD  URINALYSIS, MICROSCOPIC ONLY   No results found.   No diagnosis found.    MDM  Please delete. This is a duplicatenote        Doug Sou, MD 03/22/13 1103

## 2013-03-21 NOTE — ED Provider Notes (Signed)
History     CSN: [id number]  Arrival date & time 03/21/13  1223   First MD Initiated Contact with Patient 03/21/13 1303      Chief Complaint  Patient presents with  . Emesis    (Consider location/radiation/quality/duration/timing/severity/associated sxs/prior treatment) Patient is a 72 y.o. male presenting with vomiting.  Emesis Associated symptoms: abdominal pain    Patient reports vomiting onset 3 days ago. Last vomited 2 days ago noted to be "dark blood. He is also reports abdominal pain diffuse onset 3 days ago which has resolved without treatment. His last bowel movement was 2 days ago, black. He also had a black bowel movement 3 days ago other associated symptoms include generalized weakness and weight loss, unknown quantity over past 2 days. He treated himself with Pepto-Bismol after he noticed black bowel movement and hematemesis. He denies nausea presently denies abdominal pain presently continues to complain of generalized weakness. No other associated symptoms. Past Medical History  Diagnosis Date  . Cancer     Status post curative radiotherapy with IMRT and androgen ablation for the prostate adenocarcinoma, completed December 10, 2008.  Marland Kitchen Cancer of colon     Status post left hemicolectomy at [facility] in November of 2010 revealing T3 N0 M0 colon adenocarcinoma and the patient did not receive adjuvant chemotherapy  . 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  Marland Kitchen Upper GI bleeding     EGD on 01/2011 shows esophagitis, grade 1 varices  . Hypertension   . Cirrhosis   . Anemia     bone marrow biopsy 11/2011,   . Diabetes mellitus   . Hepatitis C   . Gallstone   . Thrush, oral   . GERD (gastroesophageal reflux disease)   . DM gastroparesis   . Gastritis   . Hernia   . Pancreatitis   . Pancytopenia   . Cataract     Past Surgical History   Procedure Laterality Date  . Colon surgery    . Hernia repair    . Esophagogastroduodenoscopy  02/15/2012    Procedure: ESOPHAGOGASTRODUODENOSCOPY (EGD);  Surgeon: Eliezer Bottom., MD,FACG;  Location: [facility] ENDOSCOPY;  Service: Endoscopy;  Laterality: N/A;    Family History  Problem Relation Age of Onset  . Breast cancer Mother     mother still living and father passed with unsure reason.  . Alzheimer's disease Mother   . Healthy Sister   . Colon cancer Neg Hx   . Esophageal cancer Neg Hx   . Stomach cancer Neg Hx   . Rectal cancer Neg Hx     History  Substance Use Topics  . Smoking status: Never Smoker   . Smokeless tobacco: Never Used  . Alcohol Use: No     Comment: patient used to drink 1.5 bottle of wine plus some liquor daily for 50 + years and quit drinking 4 years ago      Review of Systems  Constitutional: Positive for appetite change, fatigue and unexpected weight change.  Gastrointestinal: Positive for vomiting, abdominal pain and blood in stool.       Hematemesis    Allergies  Review of patient's allergies indicates no known allergies.  Home Medications   Current Outpatient Rx  Name  Route  Sig  Dispense  Refill  . ferrous sulfate 325 (65 FE) MG tablet   Oral   Take 325 mg by  mouth 2 (two) times daily with a meal.         . insulin glargine (LANTUS) 100 UNIT/ML injection      Inject 43 units into the skin subcutaneously at night.   10 mL   11   . lisinopril (PRINIVIL,ZESTRIL) 10 MG tablet   Oral   Take 1 tablet (10 mg total) by mouth daily.   30 tablet   11   . Multiple Vitamin (MULTIVITAMIN) tablet   Oral   Take 1 tablet by mouth daily.   30 tablet   3   . nadolol (CORGARD) 80 MG tablet   Oral   Take 1 tablet (80 mg total) by mouth daily.   30 tablet   11   . ACCU-CHEK FASTCLIX LANCETS MISC   Does not apply   1 each by Does not apply route 2 (two) times daily.   102 each   6     Use to check blood sugars up to twice daily    . glucose blood (ACCU-CHEK SMARTVIEW) test strip      Use as instructed   100 each   12     BP 128/69  Pulse 67  Temp(Src) 97.9 F (36.6 C) (Oral)  Resp 18  SpO2 99%  Physical Exam  Nursing note and vitals reviewed. Constitutional: He appears well-developed and well-nourished.  HENT:  Head: Normocephalic and atraumatic.  Eyes: Conjunctivae are normal. Pupils are equal, round, and reactive to light.  Neck: Neck supple. No tracheal deviation present. No thyromegaly present.  Cardiovascular: Normal rate and regular rhythm.   No murmur heard. Pulmonary/Chest: Effort normal and breath sounds normal.  Abdominal: Soft. Bowel sounds are normal. He exhibits no distension. There is no tenderness.  Genitourinary: Penis normal. Guaiac positive stool.  Rectal normal tone black stool Hemoccult positive  Musculoskeletal: Normal range of motion. He exhibits no edema and no tenderness.  Neurological: He is alert. Coordination normal.  Skin: Skin is warm and dry. No rash noted.  Psychiatric: He has a normal mood and affect.    ED Course  Procedures (including critical care time)  Labs Reviewed  CBC WITH DIFFERENTIAL  COMPREHENSIVE METABOLIC PANEL  URINALYSIS, MICROSCOPIC ONLY  TYPE AND SCREEN   No results found.   No diagnosis found.  Patient resting comfortable at 3:10 PM Results for orders placed during the hospital encounter of 03/21/13  CBC WITH DIFFERENTIAL      Result Value Range   WBC 4.7  4.0 - 10.5 K/uL   RBC 3.31 (*) 4.22 - 5.81 MIL/uL   Hemoglobin 11.5 (*) 13.0 - 17.0 g/dL   HCT 16.1 (*) 09.6 - 04.5 %   MCV 99.1  78.0 - 100.0 fL   MCH 34.7 (*) 26.0 - 34.0 pg   MCHC 35.1  30.0 - 36.0 g/dL   RDW 40.9  81.1 - 91.4 %   Platelets 62 (*) 150 - 400 K/uL   Neutrophils Relative 54  43 - 77 %   Neutro Abs 2.6  1.7 - 7.7 K/uL   Lymphocytes Relative 26  12 - 46 %   Lymphs Abs 1.2  0.7 - 4.0 K/uL   Monocytes Relative 16 (*) 3 - 12 %   Monocytes Absolute 0.7  0.1 - 1.0  K/uL   Eosinophils Relative 4  0 - 5 %   Eosinophils Absolute 0.2  0.0 - 0.7 K/uL   Basophils Relative 0  0 - 1 %   Basophils Absolute 0.0  0.0 - 0.1 K/uL  COMPREHENSIVE METABOLIC PANEL      Result Value Range   Sodium 134 (*) 135 - 145 mEq/L   Potassium 4.6  3.5 - 5.1 mEq/L   Chloride 101  96 - 112 mEq/L   CO2 23  19 - 32 mEq/L   Glucose, Bld 120 (*) 70 - 99 mg/dL   BUN 38 (*) 6 - 23 mg/dL   Creatinine, Ser 4.54  0.50 - 1.35 mg/dL   Calcium 9.8  8.4 - 09.8 mg/dL   Total Protein 6.2  6.0 - 8.3 g/dL   Albumin 2.9 (*) 3.5 - 5.2 g/dL   AST 54 (*) 0 - 37 U/L   ALT 28  0 - 53 U/L   Alkaline Phosphatase 53  39 - 117 U/L   Total Bilirubin 1.1  0.3 - 1.2 mg/dL   GFR calc non Af Amer 58 (*) >90 mL/min   GFR calc Af Amer 67 (*) >90 mL/min  URINALYSIS, MICROSCOPIC ONLY      Result Value Range   Color, Urine YELLOW  YELLOW   APPearance CLEAR  CLEAR   Specific Gravity, Urine 1.020  1.005 - 1.030   pH 7.0  5.0 - 8.0   Glucose, UA NEGATIVE  NEGATIVE mg/dL   Hgb urine dipstick NEGATIVE  NEGATIVE   Bilirubin Urine NEGATIVE  NEGATIVE   Ketones, ur NEGATIVE  NEGATIVE mg/dL   Protein, ur NEGATIVE  NEGATIVE mg/dL   Urobilinogen, UA 0.2  0.0 - 1.0 mg/dL   Nitrite NEGATIVE  NEGATIVE   Leukocytes, UA NEGATIVE  NEGATIVE   WBC, UA 0-2  <3 WBC/hpf   RBC / HPF 0-2  <3 RBC/hpf  PROTIME-INR      Result Value Range   Prothrombin Time 15.3 (*) 11.6 - 15.2 seconds   INR 1.23  0.00 - 1.49  OCCULT BLOOD, POC DEVICE      Result Value Range   Fecal Occult Bld POSITIVE (*) NEGATIVE  TYPE AND SCREEN      Result Value Range   ABO/RH(D) B POS     Antibody Screen NEG     Sample Expiration 03/24/2013     No results found.  MDM   Spoke with Dr.Sharda Plan admit medical surgical floor IV Protonix Nothing by mouth IV fluids Diagnosis acute upper gastrointestinal bleed #2 anemia        Doug Sou, MD 03/21/13 1517

## 2013-03-21 NOTE — H&P (Signed)
[facility] Note Date: 03/21/2013  Patient name: Eddie [name] Medical record number: [id number] Date of birth: 11/26/1941 Age: 72 y.o. Gender: male PCP: [name], MD  Medical Service:  Attending physician:      Chief Complaint:   History of Present Illness: Eddie [name] is a [phone] with a history of cirrhosis (2/2 HCV and chronic EtOH abuse w/ esophageal varices), multiple malignancies, and esophagitis who presents following an episode of hematemesis with dark colored stool.  Two nights ago he developed a left sided abdominal pain that was followed by nausea and a single episode of bloody emesis. He has not experienced any further episodes of emesis, but the abdominal pain has persisted and has become more generalized.  He describes it only as an "upset stomach".  At its worst he rates it 6/10, but 2/10 while in the ED.  He took Pepto bismol (black stool occurred prior to this) and Tums without improvement.  The abdominal pain did improve following the bowel movement, and he has not had another BM since then.  He has not eaten since this began, and has only had small amounts of water.  He does report 1 previous episode of hematemesis 1 year ago (02/2012), treated with diflucan and nadolol, and denies having had another episode until now.  He denies fever/chills/night sweats, headaches, dizziness, vision changes.   Meds: Current Outpatient Rx  Name  Route  Sig  Dispense  Refill  . ferrous sulfate 325 (65 FE) MG tablet   Oral   Take 325 mg by mouth 2 (two) times daily with a meal.         . insulin glargine (LANTUS) 100 UNIT/ML injection      Inject 43 units into the skin subcutaneously at night.   10 mL   11   . lisinopril (PRINIVIL,ZESTRIL) 10 MG tablet   Oral   Take 1 tablet (10 mg total) by mouth daily.   30 tablet   11   . Multiple Vitamin (MULTIVITAMIN) tablet   Oral   Take 1 tablet by mouth daily.   30 tablet   3   . nadolol (CORGARD) 80 MG  tablet   Oral   Take 1 tablet (80 mg total) by mouth daily.   30 tablet   11   . ACCU-CHEK FASTCLIX LANCETS MISC   Does not apply   1 each by Does not apply route 2 (two) times daily.   102 each   6     Use to check blood sugars up to twice daily   . glucose blood (ACCU-CHEK SMARTVIEW) test strip      Use as instructed   100 each   12     Allergies: Allergies as of 03/21/2013  . (No Known Allergies)   Past Medical History  Diagnosis Date  . Cancer     Status post curative radiotherapy with IMRT and androgen ablation for the prostate adenocarcinoma, completed December 10, 2008.  Marland Kitchen Cancer of colon     Status post left hemicolectomy at [facility] in November of 2010 revealing T3 N0 M0 colon adenocarcinoma and the patient did not receive adjuvant chemotherapy  . 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  Marland Kitchen Upper GI bleeding     EGD on 01/2011 shows esophagitis, grade 1 varices  . Hypertension   . Cirrhosis   .  Anemia     bone marrow biopsy 11/2011,   . Diabetes mellitus   . Hepatitis C   . Gallstone   . Thrush, oral   . GERD (gastroesophageal reflux disease)   . DM gastroparesis   . Gastritis   . Hernia   . Pancreatitis   . Pancytopenia   . Cataract    Past Surgical History  Procedure Laterality Date  . Colon surgery    . Hernia repair    . Esophagogastroduodenoscopy  02/15/2012    Procedure: ESOPHAGOGASTRODUODENOSCOPY (EGD);  Surgeon: Eliezer Bottom., MD,FACG;  Location: [facility] ENDOSCOPY;  Service: Endoscopy;  Laterality: N/A;   Family History  Problem Relation Age of Onset  . Breast cancer Mother     mother still living and father passed with unsure reason.  . Alzheimer's disease Mother   . Colon cancer Neg Hx   . Esophageal cancer Neg Hx   . Stomach cancer Neg Hx   . Rectal cancer Neg Hx    History   Social History  . Marital  Status: Single    Spouse Name: N/A    Number of Children: N/A  . Years of Education: college   Occupational History  . Music teacher     retired   Social History Main Topics  . Smoking status: Never Smoker   . Smokeless tobacco: Never Used  . Alcohol Use: No     Comment: patient used to drink 1.5 bottle of wine plus some liquor daily for 50 + years and quit drinking 4 years ago  . Drug Use: No  . Sexually Active: Not Currently   Other Topics Concern  . Not on file   Social History Narrative   Lives with his mother at home. Patient is single and retired, receive SS check.  Went to college in [place] - majored in music          Review of Systems: Constitutional: 30 pound weight loss in last 47m.  No fevers/chills/night sweats. Eyes: No acute vision changes Respiratory: No shortness of breath Cardiovascular: No chest pain Gastrointestinal: + Black tarry stool.  No chronic constipation, no diarrhea, no BRBPR Musculoskeletal: Generalized weakness Neurological: No numbness, no confusion  Physical Exam: Blood pressure 97/61, pulse 66, temperature 97.1 F (36.2 C), temperature source Oral, resp. rate 16, SpO2 100.00%. BP 97/61  Pulse 66  Temp(Src) 97.1 F (36.2 C) (Oral)  Resp 16  SpO2 100%  General Appearance:    Alert, cooperative, no distress, appears stated age  Head:    Normocephalic, without obvious abnormality, atraumatic  Eyes:    Mild scleral icterus. EOM's intact     Throat:   White coating of tongue, minimal amount removed with scraping.  Neck:   No cervical lymphadenopathy noted.  Lungs:     Clear to auscultation bilaterally, respirations unlabored  Chest wall:    No tenderness to palpation  Heart:    Regular rate and rhythm, S1 and S2 normal, no murmur, rub   or gallop  Abdomen:     Soft, non-tender, bowel sounds active all four quadrants  Genitalia:    Normal male without lesion  Rectal:    Deferred, refer to ED admission note  Extremities:   No lower  extremity edema  Pulses:   Palpable radial, dorsalis pedis pulses  Skin:   Decreased skin turgor  Neurologic:   CNII-XII intact. Normal strength and sensation throughout    Lab results:  CBC    Component Value Date/Time  WBC 4.7 03/21/2013 1323   WBC 3.0* 11/24/2012 1427   RBC 3.31* 03/21/2013 1323   RBC 4.32 11/24/2012 1427   HGB 11.5* 03/21/2013 1323   HGB 14.6 11/24/2012 1427   HCT 32.8* 03/21/2013 1323   HCT 42.4 11/24/2012 1427   PLT 62* 03/21/2013 1323   PLT 49* 11/24/2012 1427   MCV 99.1 03/21/2013 1323   MCV 98.1* 11/24/2012 1427   MCH 34.7* 03/21/2013 1323   MCH 33.8* 11/24/2012 1427   MCHC 35.1 03/21/2013 1323   MCHC 34.4 11/24/2012 1427   RDW 13.4 03/21/2013 1323   RDW 13.5 11/24/2012 1427   LYMPHSABS 1.2 03/21/2013 1323   LYMPHSABS 0.8* 11/24/2012 1427   MONOABS 0.7 03/21/2013 1323   MONOABS 0.3 11/24/2012 1427   EOSABS 0.2 03/21/2013 1323   EOSABS 0.2 11/24/2012 1427   BASOSABS 0.0 03/21/2013 1323   BASOSABS 0.0 11/24/2012 1427   BMET    Component Value Date/Time   NA 134* 03/21/2013 1323   NA 134* 11/24/2012 1427   NA 140 05/23/2012 0925   K 4.6 03/21/2013 1323   K 3.9 11/24/2012 1427   K 5.1* 05/23/2012 0925   CL 101 03/21/2013 1323   CL 101 11/24/2012 1427   CL 97* 05/23/2012 0925   CO2 23 03/21/2013 1323   CO2 27 11/24/2012 1427   CO2 31 05/23/2012 0925   GLUCOSE 120* 03/21/2013 1323   GLUCOSE 174* 11/24/2012 1427   GLUCOSE 252* 05/23/2012 0925   BUN 38* 03/21/2013 1323   BUN 10.0 11/24/2012 1427   BUN 11 05/23/2012 0925   CREATININE 1.23 03/21/2013 1323   CREATININE 1.3 11/24/2012 1427   CREATININE 1.3* 05/23/2012 0925   CALCIUM 9.8 03/21/2013 1323   CALCIUM 9.9 11/24/2012 1427   CALCIUM 9.4 05/23/2012 0925   GFRNONAA 58* 03/21/2013 1323   GFRAA 67* 03/21/2013 1323   Hepatic Function Panel     Component Value Date/Time   PROT 6.2 03/21/2013 1323   PROT 8.3 11/24/2012 1427   PROT 8.3* 05/23/2012 0925   ALBUMIN 2.9* 03/21/2013 1323   ALBUMIN 3.6 11/24/2012  1427   AST 54* 03/21/2013 1323   AST 48* 11/24/2012 1427   AST 51* 05/23/2012 0925   ALT 28 03/21/2013 1323   ALT 37 11/24/2012 1427   ALKPHOS 53 03/21/2013 1323   ALKPHOS 110 11/24/2012 1427   ALKPHOS 101* 05/23/2012 0925   BILITOT 1.1 03/21/2013 1323   BILITOT 1.57* 11/24/2012 1427   BILITOT 1.20 05/23/2012 0925   INR 1.23  Hemeoccult Positive (performed by ED team)  Imaging results: None  Other results: None  Assessment & Plan by Problem: Mr. 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  ** Hematemesis/Abd pain.  Pt's history is consistent with upper GI bleed, but the origin is unclear.  Prior EGD in 2013 showed evidence of esophagitis, esophageal varices, gastric ulcer, and gastric varices.  Treatment at that time included Nadolol and Diflucan.  Also has mesenteric vein thrombosis (anticoagulation stopped following hematemesis in 2013) which could cause abd pain and n/v.  Also has history of H. Pylori, chronic pancreatitis which could be related.  GI consulted, appreciate recommendations -Admit to Tele - Protonix 40mg  bid - Octreotide bolus, then 68mcg/hr - Check lipase - Repeat CBC tonight -UDS - GI planning on EGD tomorrow, NPO after midnight- clear liquid diet until then  **  Anemia- Pt with mild normocytic anemic at admission (Hgb 11.5).  Currently taking iron supplement.  Could be secondary to chronic blood loss with GI bleed, but MCV is high-normal (98.8).  Last B12 (1223) and Folate (19.9) from 2013 showed no deficiencies. -Will check B12, folate -Will check Ferritin  ** Hypertension- Pt mildly hypotensive (97/61) and appears dehydrated clinically at admission. -Holding Lisinopril, nadolol.  Can continue once BP improves  ** Cirrhosis- Pt has prior history of chronic alcohol abuse and HCV  positive.  AST mildly elevated (53) but remainder of LFTs wnl.  Albumin low (2.9), and PT/INR wnl.  Pt states that he does not follow with an outpatient GI doctor. -Will monitor CMET -Ensure pt has adequete outpt f/u prior to discharge  ** Diabetes.  Pt's HbA1c 7.4 in 12/2012.  Followed in resident clinic. -Hold home insulin as pt will be NPO after midnight -POC glucose monitoring with SSI as needed  ** CKD- Pt's Cr at time of admission is 1.23.  BUN 38 indicates pre-renal component, consistent with clinical findings of dehydration. - IVF  -Monitor BMET  ** Malignancy- Pt has history of Stage III tonsillar carcinoma (s/p chemoRT), Prostate cancer (s/p RT and androgen deprivation therapy) and Colorectal cancer (s/p hemicolectomy).  Last colonoscopy in 11/2012 showed no evidence of recurrent disease.  CT Chest, A/P, and neck from 2013 showed no evidence of recurrent disease. -Recommend continued f/u.  Dr. Shirline Frees  ** Thrombocytopenia- Unclear etiology, but has been consistently low for several years.  Plts are 62 at admission.   Bone marrow biopsy in 2012 showed no abnormalities to explain thrombocytopenia.  Thought to be secondary to chronic liver disease. -Monitor plts with daily CBC, especially in setting of GI bleed.  ** DVT prophylaxis- SCD's   This is a Psychologist, occupational Note.  The care of the patient was discussed with Dr. Everardo Beals and the assessment and plan was formulated with their assistance.  Please see their note for official documentation of the patient encounter.   Signed: Peterson Lombard 03/21/2013, 4:22 PM

## 2013-03-22 ENCOUNTER — Encounter (HOSPITAL_COMMUNITY)
Admission: EM | Disposition: A | Discharge status: Home / Self Care | Payer: Self-pay | Source: Home / Self Care | Attending: Internal Medicine

## 2013-03-22 ENCOUNTER — Encounter (HOSPITAL_COMMUNITY): Payer: Self-pay | Admitting: *Deleted

## 2013-03-22 DIAGNOSIS — K219 Gastro-esophageal reflux disease without esophagitis: Secondary | ICD-10-CM

## 2013-03-22 DIAGNOSIS — I868 Varicose veins of other specified sites: Secondary | ICD-10-CM

## 2013-03-22 DIAGNOSIS — I8510 Secondary esophageal varices without bleeding: Secondary | ICD-10-CM

## 2013-03-22 DIAGNOSIS — K7460 Unspecified cirrhosis of liver: Secondary | ICD-10-CM

## 2013-03-22 DIAGNOSIS — K922 Gastrointestinal hemorrhage, unspecified: Principal | ICD-10-CM

## 2013-03-22 DIAGNOSIS — B1710 Acute hepatitis C without hepatic coma: Secondary | ICD-10-CM

## 2013-03-22 DIAGNOSIS — K319 Disease of stomach and duodenum, unspecified: Secondary | ICD-10-CM

## 2013-03-22 HISTORY — PX: ESOPHAGOGASTRODUODENOSCOPY: SHX5428

## 2013-03-22 LAB — COMPREHENSIVE METABOLIC PANEL
ALT: 24 U/L (ref 0–53)
AST: 48 U/L — ABNORMAL HIGH (ref 0–37)
Alkaline Phosphatase: 50 U/L (ref 39–117)
Calcium: 8.8 mg/dL (ref 8.4–10.5)
Potassium: 3.8 mEq/L (ref 3.5–5.1)
Sodium: 137 mEq/L (ref 135–145)
Total Protein: 5.9 g/dL — ABNORMAL LOW (ref 6.0–8.3)

## 2013-03-22 LAB — GLUCOSE, CAPILLARY
Glucose-Capillary: 49 mg/dL — ABNORMAL LOW (ref 70–99)
Glucose-Capillary: 96 mg/dL (ref 70–99)
Glucose-Capillary: 80 mg/dL (ref 70–99)

## 2013-03-22 LAB — CBC
HCT: 32.3 % — ABNORMAL LOW (ref 39.0–52.0)
Hemoglobin: 11.4 g/dL — ABNORMAL LOW (ref 13.0–17.0)
MCH: 34.9 pg — ABNORMAL HIGH (ref 26.0–34.0)
MCHC: 35.3 g/dL (ref 30.0–36.0)
MCV: 98.8 fL (ref 78.0–100.0)
RDW: 13.4 % (ref 11.5–15.5)

## 2013-03-22 LAB — HIV ANTIBODY (ROUTINE TESTING W REFLEX): HIV: NONREACTIVE

## 2013-03-22 LAB — FERRITIN: Ferritin: 109 ng/mL (ref 22–322)

## 2013-03-22 LAB — FOLATE: Folate: >20 ng/mL

## 2013-03-22 SURGERY — EGD (ESOPHAGOGASTRODUODENOSCOPY)
Anesthesia: Moderate Sedation

## 2013-03-22 MED ORDER — FENTANYL CITRATE 0.05 MG/ML IJ SOLN
INTRAMUSCULAR | Status: DC | PRN
  Administered 2013-03-22 (×2): 25 ug via INTRAVENOUS

## 2013-03-22 MED ORDER — CIPROFLOXACIN HCL 500 MG PO TABS: 500.0000 mg | ORAL_TABLET | Freq: Two times a day (BID) | ORAL | Status: AC

## 2013-03-22 MED ORDER — MIDAZOLAM HCL 5 MG/ML IJ SOLN
INTRAMUSCULAR | Status: AC
  Filled 2013-03-22: qty 2

## 2013-03-22 MED ORDER — PANTOPRAZOLE SODIUM 40 MG PO TBEC: 40.0000 mg | DELAYED_RELEASE_TABLET | Freq: Two times a day (BID) | ORAL | Status: DC

## 2013-03-22 MED ORDER — CIPROFLOXACIN HCL 500 MG PO TABS
500.0000 mg | ORAL_TABLET | Freq: Two times a day (BID) | ORAL | Status: DC
  Administered 2013-03-22 (×2): 500 mg via ORAL
  Filled 2013-03-22 (×3): qty 1

## 2013-03-22 MED ORDER — DEXTROSE 50 % IV SOLN
INTRAVENOUS | Status: AC
  Administered 2013-03-22: 50 mL
  Filled 2013-03-22: qty 50

## 2013-03-22 MED ORDER — PANTOPRAZOLE SODIUM 40 MG PO TBEC
40.0000 mg | DELAYED_RELEASE_TABLET | Freq: Two times a day (BID) | ORAL | Status: DC
  Administered 2013-03-22: 40 mg via ORAL
  Filled 2013-03-22: qty 1

## 2013-03-22 MED ORDER — INSULIN GLARGINE 100 UNIT/ML ~~LOC~~ SOLN: SUBCUTANEOUS | Status: DC

## 2013-03-22 MED ORDER — MIDAZOLAM HCL 10 MG/2ML IJ SOLN
INTRAMUSCULAR | Status: DC | PRN
  Administered 2013-03-22: 2 mg via INTRAVENOUS
  Administered 2013-03-22: 1 mg via INTRAVENOUS
  Administered 2013-03-22: 2 mg via INTRAVENOUS

## 2013-03-22 MED ORDER — SODIUM CHLORIDE 0.9 % IV SOLN
INTRAVENOUS | Status: DC
  Administered 2013-03-22: 125 mL via INTRAVENOUS

## 2013-03-22 MED ORDER — BUTAMBEN-TETRACAINE-BENZOCAINE 2-2-14 % EX AERO
INHALATION_SPRAY | CUTANEOUS | Status: DC | PRN
  Administered 2013-03-22: 2 via TOPICAL

## 2013-03-22 MED ORDER — SODIUM CHLORIDE 0.9 % IV BOLUS (SEPSIS)
500.0000 mL | Freq: Once | INTRAVENOUS | Status: AC
  Administered 2013-03-22: 500 mL via INTRAVENOUS

## 2013-03-22 MED ORDER — FENTANYL CITRATE 0.05 MG/ML IJ SOLN
INTRAMUSCULAR | Status: AC
  Filled 2013-03-22: qty 2

## 2013-03-22 NOTE — H&P (Signed)
Internal Medicine Teaching Service Attending Note Date: 03/22/2013  Patient name: Eddie [name]  Medical record number: [id number]  Date of birth: [birth year]   I have seen and evaluated [name] and discussed their care with the Residency Team.    Mr. Spinella is a pleasant 72yo man with PMH of HCV, cirrhosis, pancreatitis, gastritis who presented to the ED with complaints of nausea and abdominal pain.  He noted that 2 days prior to admission, he had a single dark tarry bowel movement followed by 1 episode of dark bloody emesis.  This only occurred the one time, he has not had a BM since that time. He had abdominal pain which started in the left side and generalized to the entire abdomen, 6/10 at its worst.  He describes the pain as crampy.  Further symptoms include weakness and weight loss and decreased PO intake due to lack of appetite.  He has been drinking water okay.  He denies any other blood loss, dizziness, blurry vision, chest pain or SOB.   He does not take any otc NSAIDS.  He is on nadolol for esophageal varices.   For further medical history, please see resident H&P.  Physical Exam: Blood pressure 105/63, pulse 55, temperature 97.5 F (36.4 C), temperature source Oral, resp. rate 18, height 6' (1.829 m), weight 191 lb 9.3 oz (86.9 kg), SpO2 92.00%. General appearance: alert, cooperative and appears stated age Head: Normocephalic, without obvious abnormality, atraumatic Eyes: EOMI, mild icterus which could be baseline Lungs: clear to auscultation bilaterally and no wheezing  Heart: regular rate, normal rhythm, no murmur noted Abdomen: soft, NT, +BS Extremities: warm, no edema Pulses: 2+ and symmetric  Lab results: Results for orders placed during the hospital encounter of 03/21/13 (from the past 24 hour(s))  URINALYSIS, MICROSCOPIC ONLY     Status: None   Collection Time    03/21/13 12:48 PM      Result Value Range   Color, Urine YELLOW  YELLOW   APPearance CLEAR   CLEAR   Specific Gravity, Urine 1.020  1.005 - 1.030   pH 7.0  5.0 - 8.0   Glucose, UA NEGATIVE  NEGATIVE mg/dL   Hgb urine dipstick NEGATIVE  NEGATIVE   Bilirubin Urine NEGATIVE  NEGATIVE   Ketones, ur NEGATIVE  NEGATIVE mg/dL   Protein, ur NEGATIVE  NEGATIVE mg/dL   Urobilinogen, UA 0.2  0.0 - 1.0 mg/dL   Nitrite NEGATIVE  NEGATIVE   Leukocytes, UA NEGATIVE  NEGATIVE   WBC, UA 0-2  <3 WBC/hpf   RBC / HPF 0-2  <3 RBC/hpf  OCCULT BLOOD, POC DEVICE     Status: Abnormal   Collection Time    03/21/13  1:15 PM      Result Value Range   Fecal Occult Bld POSITIVE (*) NEGATIVE  CBC WITH DIFFERENTIAL     Status: Abnormal   Collection Time    03/21/13  1:23 PM      Result Value Range   WBC 4.7  4.0 - 10.5 K/uL   RBC 3.31 (*) 4.22 - 5.81 MIL/uL   Hemoglobin 11.5 (*) 13.0 - 17.0 g/dL   HCT 13.0 (*) 86.5 - 78.4 %   MCV 99.1  78.0 - 100.0 fL   MCH 34.7 (*) 26.0 - 34.0 pg   MCHC 35.1  30.0 - 36.0 g/dL   RDW 69.6  29.5 - 28.4 %   Platelets 62 (*) 150 - 400 K/uL   Neutrophils Relative 54  43 -  77 %   Neutro Abs 2.6  1.7 - 7.7 K/uL   Lymphocytes Relative 26  12 - 46 %   Lymphs Abs 1.2  0.7 - 4.0 K/uL   Monocytes Relative 16 (*) 3 - 12 %   Monocytes Absolute 0.7  0.1 - 1.0 K/uL   Eosinophils Relative 4  0 - 5 %   Eosinophils Absolute 0.2  0.0 - 0.7 K/uL   Basophils Relative 0  0 - 1 %   Basophils Absolute 0.0  0.0 - 0.1 K/uL  COMPREHENSIVE METABOLIC PANEL     Status: Abnormal   Collection Time    03/21/13  1:23 PM      Result Value Range   Sodium 134 (*) 135 - 145 mEq/L   Potassium 4.6  3.5 - 5.1 mEq/L   Chloride 101  96 - 112 mEq/L   CO2 23  19 - 32 mEq/L   Glucose, Bld 120 (*) 70 - 99 mg/dL   BUN 38 (*) 6 - 23 mg/dL   Creatinine, Ser 1.61  0.50 - 1.35 mg/dL   Calcium 9.8  8.4 - 09.6 mg/dL   Total Protein 6.2  6.0 - 8.3 g/dL   Albumin 2.9 (*) 3.5 - 5.2 g/dL   AST 54 (*) 0 - 37 U/L   ALT 28  0 - 53 U/L   Alkaline Phosphatase 53  39 - 117 U/L   Total Bilirubin 1.1  0.3 - 1.2  mg/dL   GFR calc non Af Amer 58 (*) >90 mL/min   GFR calc Af Amer 67 (*) >90 mL/min  PROTIME-INR     Status: Abnormal   Collection Time    03/21/13  1:24 PM      Result Value Range   Prothrombin Time 15.3 (*) 11.6 - 15.2 seconds   INR 1.23  0.00 - 1.49  TYPE AND SCREEN     Status: None   Collection Time    03/21/13  1:30 PM      Result Value Range   ABO/RH(D) B POS     Antibody Screen NEG     Sample Expiration 03/24/2013    ETHANOL     Status: None   Collection Time    03/21/13  5:08 PM      Result Value Range   Alcohol, Ethyl (B) <11  0 - 11 mg/dL  CBC     Status: Abnormal   Collection Time    03/21/13  5:08 PM      Result Value Range   WBC 4.5  4.0 - 10.5 K/uL   RBC 3.22 (*) 4.22 - 5.81 MIL/uL   Hemoglobin 11.3 (*) 13.0 - 17.0 g/dL   HCT 04.5 (*) 40.9 - 81.1 %   MCV 100.3 (*) 78.0 - 100.0 fL   MCH 35.1 (*) 26.0 - 34.0 pg   MCHC 35.0  30.0 - 36.0 g/dL   RDW 91.4  78.2 - 95.6 %   Platelets 52 (*) 150 - 400 K/uL  FERRITIN     Status: None   Collection Time    03/21/13  5:08 PM      Result Value Range   Ferritin 109  22 - 322 ng/mL  FOLATE     Status: None   Collection Time    03/21/13  5:08 PM      Result Value Range   Folate >20.0    VITAMIN B12     Status: Abnormal   Collection Time  03/21/13  5:08 PM      Result Value Range   Vitamin B-12 1035 (*) 211 - 911 pg/mL  HIV ANTIBODY (ROUTINE TESTING)     Status: None   Collection Time    03/21/13  5:08 PM      Result Value Range   HIV NON REACTIVE  NON REACTIVE  LIPASE, BLOOD     Status: None   Collection Time    03/21/13  5:13 PM      Result Value Range   Lipase 43  11 - 59 U/L  GLUCOSE, CAPILLARY     Status: None   Collection Time    03/21/13  6:36 PM      Result Value Range   Glucose-Capillary 86  70 - 99 mg/dL  CBC     Status: Abnormal   Collection Time    03/21/13  8:56 PM      Result Value Range   WBC 3.8 (*) 4.0 - 10.5 K/uL   RBC 3.07 (*) 4.22 - 5.81 MIL/uL   Hemoglobin 10.8 (*) 13.0 - 17.0  g/dL   HCT 40.9 (*) 81.1 - 91.4 %   MCV 100.0  78.0 - 100.0 fL   MCH 35.2 (*) 26.0 - 34.0 pg   MCHC 35.2  30.0 - 36.0 g/dL   RDW 78.2  95.6 - 21.3 %   Platelets 43 (*) 150 - 400 K/uL  GLUCOSE, CAPILLARY     Status: Abnormal   Collection Time    03/21/13  9:44 PM      Result Value Range   Glucose-Capillary 109 (*) 70 - 99 mg/dL  PREPARE PLATELET PHERESIS     Status: None   Collection Time    03/21/13 11:17 PM      Result Value Range   Unit Number [id number]     Blood Component Type PLTPHER LR2     Unit division 00     Status of Unit ISSUED     Transfusion Status OK TO TRANSFUSE    COMPREHENSIVE METABOLIC PANEL     Status: Abnormal   Collection Time    03/22/13  6:30 AM      Result Value Range   Sodium 137  135 - 145 mEq/L   Potassium 3.8  3.5 - 5.1 mEq/L   Chloride 105  96 - 112 mEq/L   CO2 21  19 - 32 mEq/L   Glucose, Bld 48 (*) 70 - 99 mg/dL   BUN 29 (*) 6 - 23 mg/dL   Creatinine, Ser 2.84  0.50 - 1.35 mg/dL   Calcium 8.8  8.4 - 13.2 mg/dL   Total Protein 5.9 (*) 6.0 - 8.3 g/dL   Albumin 2.8 (*) 3.5 - 5.2 g/dL   AST 48 (*) 0 - 37 U/L   ALT 24  0 - 53 U/L   Alkaline Phosphatase 50  39 - 117 U/L   Total Bilirubin 1.3 (*) 0.3 - 1.2 mg/dL   GFR calc non Af Amer 61 (*) >90 mL/min   GFR calc Af Amer 70 (*) >90 mL/min  CBC     Status: Abnormal   Collection Time    03/22/13  6:30 AM      Result Value Range   WBC 4.6  4.0 - 10.5 K/uL   RBC 3.27 (*) 4.22 - 5.81 MIL/uL   Hemoglobin 11.4 (*) 13.0 - 17.0 g/dL   HCT 44.0 (*) 10.2 - 72.5 %   MCV 98.8  78.0 - 100.0  fL   MCH 34.9 (*) 26.0 - 34.0 pg   MCHC 35.3  30.0 - 36.0 g/dL   RDW 47.8  29.5 - 62.1 %   Platelets 70 (*) 150 - 400 K/uL  GLUCOSE, CAPILLARY     Status: Abnormal   Collection Time    03/22/13  7:48 AM      Result Value Range   Glucose-Capillary 49 (*) 70 - 99 mg/dL  GLUCOSE, CAPILLARY     Status: Abnormal   Collection Time    03/22/13  8:15 AM      Result Value Range   Glucose-Capillary 53 (*) 70 - 99  mg/dL  GLUCOSE, CAPILLARY     Status: None   Collection Time    03/22/13  9:08 AM      Result Value Range   Glucose-Capillary 96  70 - 99 mg/dL     Assessment and Plan: I agree with the formulated Assessment and Plan with the following changes:   1. Upper GI Bleed, presumed - Due to the nature of the symptoms, this appears to be consistent with an UGIB, possible from PUD vs. Variceal vs gastritis.  His symptoms have improved and his Hgb is stable without need for PRBC transfusion.  - GI consult for EGD - Octreotide and Protonix IV - NPO - Type and screen - IVF as needed - also with chronic thrombocytopenia, monitor closely  Other issues as per resident note.   Inez Catalina, MD 3/23/20149:24 AM

## 2013-03-22 NOTE — Discharge Summary (Deleted)
Internal Medicine Teaching [facility] Discharge Note.   Patient Name:  Eddie [name]   MRN: [id number]  DOB:    December 29, 1941   PCP:    [name], MD     Date of Admission:  03/21/2013 Date of Discharge:  03/22/2013   Attending Physician:  Inez Catalina, MD     DISCHARGE DIAGNOSES: 1. Cirrhosis 2. Hepatitis C 3. Chronic alcohol abuse 4. Hypertension 5. Gastritis 6. Reflux esophagitis 7. 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 Colorectal cancer 11. Prostate cancer 12. Thrombocytopenia 13. Anemia 14. Chronic kidney disease 15. Portal hypertensive gastropathy   DISPOSITION AND FOLLOW-UP: WENDY MIKLES is to follow-up with the listed providers as detailed below, at which time, the following should be addressed:   1. Please follow-up [facility] : Schedule made by [facility] on 04/22/13 @ 10:45am with Dr. Marina Goodell (letter mailed to pt per Epic note)  -Labs / imaging needed at time of follow-up: CBC  -Follow up regarding compliance with PPI & Cipro  2. [facility] --> follow up CBGs (lantus decreased to half dose at discharge)    DISCHARGE INSTRUCTIONS:  Discharge Orders   Future Appointments Provider Department Dept Phone   04/22/2013 10:45 AM Hilarie Fredrickson, MD [facility] [phone]   11/25/2013 11:00 AM Si Gaul, MD [facility] MEDICAL ONCOLOGY [phone]   11/25/2013 11:30 AM Dava Najjar Idelle Jo [facility] [phone]   11/25/2013 12:00 PM [facility] [phone]   Patient to arrive 15 minutes prior to appointment time   Future Orders Complete By Expires     Call MD for:  extreme fatigue  As directed     Call MD for:  persistant dizziness or light-headedness  As directed     Call MD for:  persistant nausea and vomiting  As directed     Call MD for:  severe uncontrolled pain  As directed     Call MD for:  temperature >100.4  As  directed     Diet - low sodium heart healthy  As directed     Discharge instructions  As directed     Comments:      -Please start taking Ciprofloxacin 500mg  twice daily for 5 days. -Please start taking protonix 40mg  twice daily for 8 weeks, then continue at once daily indefinitely. -Please do not take any NSAIDs - that means NO good powders, aspirin, advil, ibuprofen, aleve, naproxen, motrin!!!!!!!!!! -Please be sure to follow up with Dr. Lauro Franklin office ( GI) within the next week, as well as the Outpatient Clinics. -If your blood sugar before bed time is greater than 200, please take Lantus 20units before bed. If your blood sugar is less than 200, hold insulin until your hospital follow up with your primary care doctor. Please check your blood sugar 2-3 times daily, and whenever you feel ill.    Increase activity slowly  As directed         DISCHARGE MEDICATIONS:   Medication List    TAKE these medications       ACCU-CHEK FASTCLIX LANCETS Misc  1 each by Does not apply route 2 (two) times daily.     ciprofloxacin 500 MG tablet  Commonly known as:  CIPRO  Take 1 tablet (500 mg total) by mouth 2 (two) times daily.     ferrous sulfate 325 (65 FE) MG tablet  Take 325 mg by mouth 2 (two) times daily  with a meal.     glucose blood test strip  Commonly known as:  ACCU-CHEK SMARTVIEW  Use as instructed     insulin glargine 100 UNIT/ML injection  Commonly known as:  LANTUS  Inject 20 units into the skin subcutaneously at night.     lisinopril 10 MG tablet  Commonly known as:  PRINIVIL,ZESTRIL  Take 1 tablet (10 mg total) by mouth daily.     multivitamin tablet  Take 1 tablet by mouth daily.     nadolol 80 MG tablet  Commonly known as:  CORGARD  Take 1 tablet (80 mg total) by mouth daily.     pantoprazole 40 MG tablet  Commonly known as:  PROTONIX  Take 1 tablet (40 mg total) by mouth 2 (two) times daily before a meal.         CONSULTS:  Treatment Team:  Beverley Fiedler, MD    PROCEDURES PERFORMED:   EGD (03/22/13) ENDOSCOPIC IMPRESSION:  1. Erosive/ulcerative esophagitis in the middle and distal third  2. There were 3 columns of small varices in the distal third of  the esophagus; no stigmata  3. Portal hypertensive gastropathy was found in the cardia,  gastric fundus, and on the anterior wall of the gastric body  4. There was mild antral gastropathy noted  5. Hiatus hernia was found  6. Gastric varices without stigmata  6. The duodenal mucosa showed no abnormalities in the bulb and  second portion of the duodenum    ADMISSION DATA: H&P: 72 yo M with hisotry of gastritis, esophagitis with varices, cirrhosis, hep C, and pancreatitis presents on 03/21/13 with complaints of nausea & abdominal pain 2 days ago followed by a dark, tarry bowel movement and then 1 episode of dark bloody emesis. No BM or emesis since then. Abdominal pain described as left upper quadrant pain then radiated to his epigastric region- felt like an upset stomach, possibly crampy, 5-6/10, currently 2/10. Felt better after BM. Not usually constipated. Takes iron daily. Usually has a BM daily. He reports generalized weakness & weight less. He has tried pepto-bismol, milk of magnesia and tums for relief after he noticed black bowel mvmts and dark emesis.  He hasn't eaten in 2 days (except glucerna in the ED). No appetite so cannot correlate to food. Was able to drink water well yesterday. No dizziness, blurry vision, shortness of breath or chest pain. Only takes medications that the MD prescribes, no ibuprofen/asa/EtOH.   Of note, he underwent EGD on 02/15/12 which revealed portal gastropathy, gastric varices, gastric ulcer, grade II esophageal varices, a small hiatal hernia and exudate in the proximal esophagus. Review of meds suggest he is taking all of his medications, but he only has nadolol and lisinopril with him today.   Physical Exam: Blood pressure 128/69, pulse 67,  temperature 97.9 F (36.6 C), temperature source Oral, resp. rate 18, SpO2 99.00%.  General: resting in bed, no acute distress  HEENT: PERRL, EOMI, mild scleral icterus, dry mucous membranes with white exudate on tongue (patient reports he just drank glucerna, able to scrap a minimal amt off tongue)  Cardiac: RRR, no rubs, murmurs or gallops  Pulm: clear to auscultation bilaterally, moving normal volumes of air  Abd: soft, nontender, nondistended, BS present  Ext: warm and well perfused, no pedal edema  Skin: +tenting  Neuro: alert and oriented X3, cranial nerves II-XII grossly intact  Labs: Basic Metabolic Panel:   Recent Labs   03/21/13 1323   NA  134*   K  4.6   CL  101   CO2  23   GLUCOSE  120*   BUN  38*   CREATININE  1.23   CALCIUM  9.8    Liver Function Tests:   Recent Labs   03/21/13 1323   AST  54*   ALT  28   ALKPHOS  53   BILITOT  1.1   PROT  6.2   ALBUMIN  2.9*    CBC:   Recent Labs   03/21/13 1323   WBC  4.7   NEUTROABS  2.6   HGB  11.5*   HCT  32.8*   MCV  99.1   PLT  62*    Coagulation:   Recent Labs   03/21/13 1324   LABPROT  15.3*   INR  1.23     HOSPITAL COURSE: Mr. Vihan presented following an isolated episode of hematemesis and was admitted to the medicine teaching service for further evaluation and management   # Hematemesis. The pt presented with abdominal pain, melena, and an isolated episode of bloody emesis 2 days prior to admission. He was mildly hypotensive with clinical signs of dehydration, but vitals were otherwise stable. He was mildly anemic (Hgb 11.5), and FOBT was positive. The exact etiology was unclear as prior EGD from 2013 had numerous findings including esophagitis, esophageal and gastric varices, and portal hypertensive gastropathy. IV protonix and octreotide were started, and IV fluids were provided. GI was consulted and performed an EGD the following day, which identified the same findings as the previous EGD  but did not identify any acute bleed. Hemoglobin was closely monitored throughout the hospitalization and remained stable (discharge Hgb 11.4). His abdominal pain resolved, he had no further n/v, and he was discharged in stable condition. He was instructed to continue Nadolol and was prescribed bid protonix and ciprofloxacin (500mg  bid x5d) per GI recommendations. He was told to schedule a follow-up with Dr. Marina Goodell following discharge.   # Anemia- The pt had a mild normocytic (MCV 99) anemia at the time of admission, although this appeared to be his baseline from past visits. Folate and Ferritin were wnl (pt does take iron supplements), and B12 levels were elevated. Possibly due to chronic blood loss or chronic liver disease.   # Sinus Bradycardia- The pt was noted to be in sinus bradycardia throughout the hospitalization (HR 40s-50s). This had been identified during previous hospitalizations. He remained asymptomatic, and was monitored closely via telemetry until discharge. On chronic beta blocker.  # Diabetes- The pt's most recent HgbA1c at admission was 7.4 (12/2012). His home insulin (Lantus 43U qhs) was held throughout the hospitalization as he was not eating at the time of admission. No SSI was given as the pt's glucose levels were well-controlled. He was instructed to monitor his glucose levels and only take bedtime insulin for glucose>200 (instructed to take 20U). He will be set up with an appointment in the [facility] outpatient clinic to assess need for further changes to his insulin regimen.   # Thrombocytopenia- The pt's platelets were low at the time of admission (62), but this had been noted through many of his prior hospitalizations/clinic visits. He was given 1U platelets following admission given recent GI bleed and further decrease in platelets (43). The etiology of this remains unclear. HIV test was negative. It was thought that the thrombocytopenia is secondary to chronic liver disease. He  had no bleeding events while hospitalized, and platelets were 70  at the time of discharge.   # Cirrhosis- The pt's AST at the time of admission was mildly elevated (53), but the remainder of the LFTs wnl. However, He does have sequelae from cirrhosis and does not report following with a GI doctor as an outpatient. He will be scheduled to see Dr. Rhea Belton and [facility] after discharge.   DISCHARGE DATA: Vital Signs: BP 104/62  Pulse 55  Temp(Src) 97.6 F (36.4 C) (Oral)  Resp 14  Ht 6' (1.829 m)  Wt 86.9 kg (191 lb 9.3 oz)  BMI 25.98 kg/m2  SpO2 99%  Labs: Results for orders placed during the hospital encounter of 03/21/13 (from the past 24 hour(s))  URINALYSIS, MICROSCOPIC ONLY     Status: None   Collection Time    03/21/13 12:48 PM      Result Value Range   Color, Urine YELLOW  YELLOW   APPearance CLEAR  CLEAR   Specific Gravity, Urine 1.020  1.005 - 1.030   pH 7.0  5.0 - 8.0   Glucose, UA NEGATIVE  NEGATIVE mg/dL   Hgb urine dipstick NEGATIVE  NEGATIVE   Bilirubin Urine NEGATIVE  NEGATIVE   Ketones, ur NEGATIVE  NEGATIVE mg/dL   Protein, ur NEGATIVE  NEGATIVE mg/dL   Urobilinogen, UA 0.2  0.0 - 1.0 mg/dL   Nitrite NEGATIVE  NEGATIVE   Leukocytes, UA NEGATIVE  NEGATIVE   WBC, UA 0-2  <3 WBC/hpf   RBC / HPF 0-2  <3 RBC/hpf  OCCULT BLOOD, POC DEVICE     Status: Abnormal   Collection Time    03/21/13  1:15 PM      Result Value Range   Fecal Occult Bld POSITIVE (*) NEGATIVE  CBC WITH DIFFERENTIAL     Status: Abnormal   Collection Time    03/21/13  1:23 PM      Result Value Range   WBC 4.7  4.0 - 10.5 K/uL   RBC 3.31 (*) 4.22 - 5.81 MIL/uL   Hemoglobin 11.5 (*) 13.0 - 17.0 g/dL   HCT 16.1 (*) 09.6 - 04.5 %   MCV 99.1  78.0 - 100.0 fL   MCH 34.7 (*) 26.0 - 34.0 pg   MCHC 35.1  30.0 - 36.0 g/dL   RDW 40.9  81.1 - 91.4 %   Platelets 62 (*) 150 - 400 K/uL   Neutrophils Relative 54  43 - 77 %   Neutro Abs 2.6  1.7 - 7.7 K/uL   Lymphocytes Relative 26  12 - 46 %    Lymphs Abs 1.2  0.7 - 4.0 K/uL   Monocytes Relative 16 (*) 3 - 12 %   Monocytes Absolute 0.7  0.1 - 1.0 K/uL   Eosinophils Relative 4  0 - 5 %   Eosinophils Absolute 0.2  0.0 - 0.7 K/uL   Basophils Relative 0  0 - 1 %   Basophils Absolute 0.0  0.0 - 0.1 K/uL  COMPREHENSIVE METABOLIC PANEL     Status: Abnormal   Collection Time    03/21/13  1:23 PM      Result Value Range   Sodium 134 (*) 135 - 145 mEq/L   Potassium 4.6  3.5 - 5.1 mEq/L   Chloride 101  96 - 112 mEq/L   CO2 23  19 - 32 mEq/L   Glucose, Bld 120 (*) 70 - 99 mg/dL   BUN 38 (*) 6 - 23 mg/dL   Creatinine, Ser 7.82  0.50 - 1.35 mg/dL  Calcium 9.8  8.4 - 10.5 mg/dL   Total Protein 6.2  6.0 - 8.3 g/dL   Albumin 2.9 (*) 3.5 - 5.2 g/dL   AST 54 (*) 0 - 37 U/L   ALT 28  0 - 53 U/L   Alkaline Phosphatase 53  39 - 117 U/L   Total Bilirubin 1.1  0.3 - 1.2 mg/dL   GFR calc non Af Amer 58 (*) >90 mL/min   GFR calc Af Amer 67 (*) >90 mL/min  PROTIME-INR     Status: Abnormal   Collection Time    03/21/13  1:24 PM      Result Value Range   Prothrombin Time 15.3 (*) 11.6 - 15.2 seconds   INR 1.23  0.00 - 1.49  TYPE AND SCREEN     Status: None   Collection Time    03/21/13  1:30 PM      Result Value Range   ABO/RH(D) B POS     Antibody Screen NEG     Sample Expiration 03/24/2013    ETHANOL     Status: None   Collection Time    03/21/13  5:08 PM      Result Value Range   Alcohol, Ethyl (B) <11  0 - 11 mg/dL  CBC     Status: Abnormal   Collection Time    03/21/13  5:08 PM      Result Value Range   WBC 4.5  4.0 - 10.5 K/uL   RBC 3.22 (*) 4.22 - 5.81 MIL/uL   Hemoglobin 11.3 (*) 13.0 - 17.0 g/dL   HCT 40.9 (*) 81.1 - 91.4 %   MCV 100.3 (*) 78.0 - 100.0 fL   MCH 35.1 (*) 26.0 - 34.0 pg   MCHC 35.0  30.0 - 36.0 g/dL   RDW 78.2  95.6 - 21.3 %   Platelets 52 (*) 150 - 400 K/uL  FERRITIN     Status: None   Collection Time    03/21/13  5:08 PM      Result Value Range   Ferritin 109  22 - 322 ng/mL  FOLATE     Status:  None   Collection Time    03/21/13  5:08 PM      Result Value Range   Folate >20.0    VITAMIN B12     Status: Abnormal   Collection Time    03/21/13  5:08 PM      Result Value Range   Vitamin B-12 1035 (*) 211 - 911 pg/mL  HIV ANTIBODY (ROUTINE TESTING)     Status: None   Collection Time    03/21/13  5:08 PM      Result Value Range   HIV NON REACTIVE  NON REACTIVE  LIPASE, BLOOD     Status: None   Collection Time    03/21/13  5:13 PM      Result Value Range   Lipase 43  11 - 59 U/L  GLUCOSE, CAPILLARY     Status: None   Collection Time    03/21/13  6:36 PM      Result Value Range   Glucose-Capillary 86  70 - 99 mg/dL  CBC     Status: Abnormal   Collection Time    03/21/13  8:56 PM      Result Value Range   WBC 3.8 (*) 4.0 - 10.5 K/uL   RBC 3.07 (*) 4.22 - 5.81 MIL/uL   Hemoglobin 10.8 (*) 13.0 - 17.0 g/dL  HCT 30.7 (*) 39.0 - 52.0 %   MCV 100.0  78.0 - 100.0 fL   MCH 35.2 (*) 26.0 - 34.0 pg   MCHC 35.2  30.0 - 36.0 g/dL   RDW 40.9  81.1 - 91.4 %   Platelets 43 (*) 150 - 400 K/uL  GLUCOSE, CAPILLARY     Status: Abnormal   Collection Time    03/21/13  9:44 PM      Result Value Range   Glucose-Capillary 109 (*) 70 - 99 mg/dL  PREPARE PLATELET PHERESIS     Status: None   Collection Time    03/21/13 11:17 PM      Result Value Range   Unit Number [id number]     Blood Component Type PLTPHER LR2     Unit division 00     Status of Unit ISSUED     Transfusion Status OK TO TRANSFUSE    COMPREHENSIVE METABOLIC PANEL     Status: Abnormal   Collection Time    03/22/13  6:30 AM      Result Value Range   Sodium 137  135 - 145 mEq/L   Potassium 3.8  3.5 - 5.1 mEq/L   Chloride 105  96 - 112 mEq/L   CO2 21  19 - 32 mEq/L   Glucose, Bld 48 (*) 70 - 99 mg/dL   BUN 29 (*) 6 - 23 mg/dL   Creatinine, Ser 7.84  0.50 - 1.35 mg/dL   Calcium 8.8  8.4 - 69.6 mg/dL   Total Protein 5.9 (*) 6.0 - 8.3 g/dL   Albumin 2.8 (*) 3.5 - 5.2 g/dL   AST 48 (*) 0 - 37 U/L   ALT 24  0 - 53  U/L   Alkaline Phosphatase 50  39 - 117 U/L   Total Bilirubin 1.3 (*) 0.3 - 1.2 mg/dL   GFR calc non Af Amer 61 (*) >90 mL/min   GFR calc Af Amer 70 (*) >90 mL/min  CBC     Status: Abnormal   Collection Time    03/22/13  6:30 AM      Result Value Range   WBC 4.6  4.0 - 10.5 K/uL   RBC 3.27 (*) 4.22 - 5.81 MIL/uL   Hemoglobin 11.4 (*) 13.0 - 17.0 g/dL   HCT 29.5 (*) 28.4 - 13.2 %   MCV 98.8  78.0 - 100.0 fL   MCH 34.9 (*) 26.0 - 34.0 pg   MCHC 35.3  30.0 - 36.0 g/dL   RDW 44.0  10.2 - 72.5 %   Platelets 70 (*) 150 - 400 K/uL  GLUCOSE, CAPILLARY     Status: Abnormal   Collection Time    03/22/13  7:48 AM      Result Value Range   Glucose-Capillary 49 (*) 70 - 99 mg/dL  GLUCOSE, CAPILLARY     Status: Abnormal   Collection Time    03/22/13  8:15 AM      Result Value Range   Glucose-Capillary 53 (*) 70 - 99 mg/dL  GLUCOSE, CAPILLARY     Status: None   Collection Time    03/22/13  9:08 AM      Result Value Range   Glucose-Capillary 96  70 - 99 mg/dL     Time spent on discharge: 25 minutes  Signed: Stacy Gardner, MD 03/22/2013, 12:40 PM

## 2013-03-22 NOTE — Op Note (Signed)
[facility] [address] [place], [address]   ENDOSCOPY PROCEDURE REPORT  PATIENT: Stedman, [place]  MR#: [id number] BIRTHDATE: March 13, 1941 , 71  yrs. old GENDER: Male ENDOSCOPIST: Beverley Fiedler, MD REFERRED BY:  Debe Coder PROCEDURE DATE:  03/22/2013 PROCEDURE:  EGD, diagnostic ASA CLASS:     Class III INDICATIONS:  Epigastric pain.   Anemia.   Hematemesis.   history of cirrhosis with portal HTN. MEDICATIONS: These medications were titrated to patient response per physician's verbal order, Fentanyl 50 mcg IV, and Versed 5 mg IV TOPICAL ANESTHETIC: Cetacaine Spray  DESCRIPTION OF PROCEDURE: After the risks benefits and alternatives of the procedure were thoroughly explained, informed consent was obtained.  The Pentax Gastroscope [id number] endoscope was introduced through the mouth and advanced to the second portion of the duodenum. Without limitations.  The instrument was slowly withdrawn as the mucosa was fully examined.     ESOPHAGUS: Multiple small non-bleeding irregular shaped and shallow ulcers consistent with reflux esophagitis was found in the middle third of the esophagus and lower third of the esophagus.   There were 3 columns of small varices (Grade 1-2) in the distal third of the esophagus.  The varices were not actively bleeding.  STOMACH: Moderate portal hypertensive gastropathy was found in the cardia, gastric fundus, and on the anterior wall of the gastric body.   A small red spot (previously seen in Feb 2013) was found on the lesser curvature of the gastric body (no evidence of bleeding). There was mild antral gastropathy/gastritis noted.   A hiatus hernia was found.   On retroflexion small gastric varices were seen without stigmata of recent bleeding  DUODENUM: The duodenal mucosa showed no abnormalities in the bulb and second portion of the duodenum.  Retroflexed views revealed findings as discussed above. The scope was  then withdrawn from the patient and the procedure completed.  COMPLICATIONS: There were no complications.  ENDOSCOPIC IMPRESSION: 1.   Erosive/ulcerative esophagitis in the middle and distal third 2.   There were 3 columns of small varices in the distal third of the esophagus; no stigmata 3.   Portal hypertensive gastropathy was found in the cardia, gastric fundus, and on the anterior wall of the gastric body 4.   There was mild antral gastropathy noted 5.   Hiatus hernia was found 6.   Gastric varices without stigmata 6.   The duodenal mucosa showed no abnormalities in the bulb and second portion of the duodenum  RECOMMENDATIONS: 1.  Twice daily PPI for at least 8 weeks to treat erosive/ulcerative esophagitis.  After 8 weeks he should be on once daily PPI indefinitely 2.  Continue nadolol for prophylaxis of bleeding in cirrhosis with portal hypertension ( esophageal varices, gastric varices, and portal hypertensive gastropathy) 3.  Strict NSAID avoidance 4.  Can discontinue octreotide 5.  Office followup with [facility] after discharge  eSigned:  Beverley Fiedler, MD 03/22/2013 12:30 PM   CC:The Patient  PATIENT NAME:  Freemon, [place] MR#: [id number]

## 2013-03-22 NOTE — Progress Notes (Signed)
Medical Student Daily Progress Note  Subjective: Overnight, pt developed bradycardia but remained asymptomatic. BP was noted to be low-normal- medical team was notified and pt given bolus.  Platelets also noted to be low (43)- given 1U platelets.  Pt developed mild itching following this that has since improved.  Abdominal pain not present on AM rounds, and he reports no further episodes of emesis.  No BM overnight.  Objective: Vital signs in last 24 hours: Filed Vitals:   03/22/13 1050 03/22/13 1052 03/22/13 1055 03/22/13 1100  BP: 151/76 151/76 140/68 143/76  Pulse:      Temp:  97.6 F (36.4 C)    TempSrc:  Oral    Resp: 20 13 14 19   Height:      Weight:      SpO2: 100% 100% 100% 100%   Weight change:   Intake/Output Summary (Last 24 hours) at 03/22/13 1107 Last data filed at 03/22/13 0700  Gross per 24 hour  Intake 2335.67 ml  Output    600 ml  Net 1735.67 ml   Physical Exam: BP 150/75  Pulse 55  Temp(Src) 97.6 F (36.4 C) (Oral)  Resp 17  Ht 6' (1.829 m)  Wt 86.9 kg (191 lb 9.3 oz)  BMI 25.98 kg/m2  SpO2 100%  General Appearance:    Alert, cooperative, no distress, appears stated age  Head:    Normocephalic, without obvious abnormality, atraumatic  Eyes:    Mild scleral icterus.  Throat:   Moist mucous membranes.  No white coating of tongue.  Lungs:     Clear to auscultation bilaterally, respirations unlabored  Heart:    Regular rate and rhythm, S1 and S2 normal, no m/r/g  Abdomen:     Soft, non-tender, bowel sounds active all four quadrants  Extremities:   No LE edema  Neurologic:   CNII-XII grossly intact.   Lab Results: CBC    Component Value Date/Time   WBC 4.6 03/22/2013 0630   RBC 3.27* 03/22/2013 0630   HGB 11.4* 03/22/2013 0630   HCT 32.3* 03/22/2013 0630   PLT 70* 03/22/2013 0630   MCV 98.8 03/22/2013 0630   MCH 34.9* 03/22/2013 0630   MCHC 35.3 03/22/2013 0630   RDW 13.4 03/22/2013 0630   LYMPHSABS 1.2 03/21/2013 1323   MONOABS 0.7 03/21/2013  1323   EOSABS 0.2 03/21/2013 1323   BASOSABS 0.0 03/21/2013 1323   BMET    Component Value Date/Time   NA 137 03/22/2013 0630   K 3.8 03/22/2013 0630   CL 105 03/22/2013 0630   CO2 21 03/22/2013 0630   GLUCOSE 48* 03/22/2013 0630   BUN 29* 03/22/2013 0630   CREATININE 1.19 03/22/2013 0630   CALCIUM 8.8 03/22/2013 0630   Lipase: 43 Ethanol: <11 Ferritin: 109 Folate: >20 B12: 1035 HIV: Non-reactive   Micro Results: None  Studies/Results:None  Medications: I have reviewed the patient's current medications. Scheduled Meds: . ciprofloxacin  500 mg Oral BID  . pantoprazole (PROTONIX) IV  40 mg Intravenous Q12H  . sodium chloride  3 mL Intravenous Q12H   Continuous Infusions: . sodium chloride 125 mL/hr at 03/21/13 1352  . sodium chloride    . 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   ** Hematemesis/Abd pain. Pt's history is consistent  with upper GI bleed, but the origin is unclear. Prior EGD in 2013 showed evidence of esophagitis, esophageal varices, gastric ulcer, and gastric varices. Treatment at that time included Nadolol and Diflucan. Also has mesenteric vein thrombosis (anticoagulation stopped following hematemesis in 2013) which could cause abd pain and n/v. Also has history of H. Pylori, chronic pancreatitis which could be related.  Lipase wnl.  GI consulted, appreciate recommendations.  Hgb stable and no signs of acute bleed. - Protonix 40mg  bid  - Octreotide 26mcg/hr  - Continue daily CBC- Hgb stable to date - Ciprofloxacin 500mg  bid x5d for prophylaxis, per GI recommendations - EGD planned for AM- f/u results and GI recommendations  ** Sinus Bradycardia- Pt has hx of  sinus bradycardia.  He remains asymptomatic and is currently monitored via tele -Continue telemetry -Consider EKG if pt becomes symptomatic.  ** Anemia- Pt with mild normocytic anemic at admission (Hgb 11.5). Currently taking iron supplement. Could be secondary to chronic blood loss with GI bleed, but MCV is high-normal (98.8).  No deficiency of B12 (1035) or folate (>20).    ** Hypertension- Pt mildly hypotensive (97/61) at admission.  Overnight BP low-normal and received bolus. -Holding Lisinopril, nadolol. Can continue once BP improves   ** Cirrhosis- Pt has prior history of chronic alcohol abuse and HCV positive. AST mildly elevated (53) but remainder of LFTs wnl. Albumin low (2.9), and PT/INR wnl. Pt states that he does not follow with an outpatient GI doctor.  -Will monitor CMET  -Ensure pt has adequete outpt f/u prior to discharge   ** Diabetes. Pt's HbA1c 7.4 in 12/2012. Followed in resident clinic.  -Hold home insulin as pt will be NPO after midnight  - SSI discontinued until tolerating PO.  Then can continue POC glucose monitoring with SSI as needed   ** CKD- Pt's Cr at time of admission is 1.23 and is 1.19 today.  Likely a pre-renal component given clinical signs of dehydration and elevated BUN at admission.  - IVF  -Monitor BMET   ** Malignancy- Pt has history of Stage III tonsillar carcinoma (s/p chemoRT), Prostate cancer (s/p RT and androgen deprivation therapy) and Colorectal cancer (s/p hemicolectomy). Last colonoscopy in 11/2012 showed no evidence of recurrent disease. CT Chest, A/P, and neck from 2013 showed no evidence of recurrent disease.  -Recommend continued f/u. Dr. Shirline Frees   ** Thrombocytopenia- Unclear etiology, but has been consistently low for several years (routinely 50s-70s for several years). Plts are 62 at admission. Bone marrow biopsy in 2012 showed no abnormalities to explain thrombocytopenia.  HIV neg. Thought to be secondary to chronic liver disease.   Pt did receive 1U of platelets on 3/23- platelets were 43. -Monitor plts with daily CBC, especially in setting of GI bleed.  ** DVT prophylaxis- SCD's   LOS: 1 day   This is a Psychologist, occupational Note.  The care of the patient was discussed with Dr. Everardo Beals and the assessment and plan formulated with their assistance.  Please see their attached note for official documentation of the daily encounter.  Peterson Lombard 03/22/2013, 11:07 AM

## 2013-03-22 NOTE — Consult Note (Signed)
[facility] Consultation  Referring Provider:  Debe Coder, MD Primary Care Physician:  [name], MD Primary Gastroenterologist:  Dr. Marina Goodell  Reason for Consultation:  Hematemesis and anemia  HPI: Eddie [name] is a 72 y.o. 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  He reports his episode of hematemesis occurred 48 hours prior to presentation and was accompanied with a black tarry stool. Since that episode 48 hours ago, he's had no further emesis or bowel movement. He does report abdominal pain described as left upper pain and midabdominal pain which radiated to the epigastric region. The pain is much improved now as compared to a few days ago.  He also notes some generalized weakness, but no fevers or chills. Appetite has been slightly down to 2 pain. He denies NSAIDs and recurrent alcohol use.   Past Medical History  Diagnosis Date  . Cancer     Status post curative radiotherapy with IMRT and androgen ablation for the prostate adenocarcinoma, completed December 10, 2008.  Marland Kitchen Cancer of colon     Status post left hemicolectomy at [facility] in November of 2010 revealing T3 N0 M0 colon adenocarcinoma and the patient did not receive adjuvant chemotherapy  . 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  Marland Kitchen Upper GI bleeding     EGD on 01/2011 shows esophagitis, grade 1  varices  . Hypertension   . Cirrhosis   . Anemia     bone marrow biopsy 11/2011,   . Diabetes mellitus   . Hepatitis C   . Gallstone   . Thrush, oral   . GERD (gastroesophageal reflux disease)   . DM gastroparesis   . Gastritis   . Hernia   . Pancreatitis   . Pancytopenia   . Cataract     Past Surgical History  Procedure Laterality Date  . Colon surgery    . Hernia repair    . Esophagogastroduodenoscopy  02/15/2012    Procedure: ESOPHAGOGASTRODUODENOSCOPY (EGD);  Surgeon: Eliezer Bottom., MD,FACG;  Location: [facility] ENDOSCOPY;  Service: Endoscopy;  Laterality: N/A;    Prior to Admission medications   Medication Sig Start Date End Date Taking? Authorizing Provider  ferrous sulfate 325 (65 FE) MG tablet Take 325 mg by mouth 2 (two) times daily with a meal.   Yes Historical Provider, MD  insulin glargine (LANTUS) 100 UNIT/ML injection Inject 43 units into the skin subcutaneously at night. 01/28/13  Yes Linward Headland, MD  lisinopril (PRINIVIL,ZESTRIL) 10 MG tablet Take 1 tablet (10 mg total) by mouth daily. 11/14/12 11/14/13 Yes Annett Gula, MD  Multiple Vitamin (MULTIVITAMIN) tablet Take 1 tablet by mouth daily. 08/07/11  Yes Elyse Jarvis, MD  nadolol (CORGARD) 80 MG tablet Take 1 tablet (80 mg total) by mouth daily. 01/28/13  Yes Linward Headland, MD  ACCU-CHEK FASTCLIX LANCETS MISC 1 each by Does not apply route 2 (two) times daily. 06/20/12   Daryel Gerald, MD  glucose blood (ACCU-CHEK SMARTVIEW)  test strip Use as instructed 06/20/12 06/20/13  Daryel Gerald, MD    Current Facility-Administered Medications  Medication Dose Route Frequency Provider Last Rate Last Dose  . 0.9 %  sodium chloride infusion   Intravenous Continuous Doug Sou, MD 125 mL/hr at 03/21/13 1352    . acetaminophen (TYLENOL) tablet 650 mg  650 mg Oral Q6H PRN Neema Davina Poke, MD       Or  . acetaminophen (TYLENOL) suppository 650 mg  650 mg Rectal Q6H PRN Neema Davina Poke, MD      .  octreotide (SANDOSTATIN) 2 mcg/mL in sodium chloride 0.9 % 250 mL infusion  25-50 mcg/hr Intravenous Continuous Belia Heman, MD 25 mL/hr at 03/21/13 1946 50 mcg/hr at 03/21/13 1946  . pantoprazole (PROTONIX) injection 40 mg  40 mg Intravenous Q12H Doug Sou, MD   40 mg at 03/21/13 2322  . sodium chloride 0.9 % injection 3 mL  3 mL Intravenous Q12H Belia Heman, MD   3 mL at 03/21/13 2300    Allergies as of 03/21/2013  . (No Known Allergies)    Family History  Problem Relation Age of Onset  . Breast cancer Mother     mother still living and father passed with unsure reason.  . Alzheimer's disease Mother   . Colon cancer Neg Hx   . Esophageal cancer Neg Hx   . Stomach cancer Neg Hx   . Rectal cancer Neg Hx     History   Social History  . Marital Status: Single    Spouse Name: N/A    Number of Children: N/A  . Years of Education: college   Occupational History  . Music teacher     retired   Social History Main Topics  . Smoking status: Never Smoker   . Smokeless tobacco: Never Used  . Alcohol Use: No     Comment: patient used to drink 1.5 bottle of wine plus some liquor daily for 50 + years and quit drinking 4 years ago  . Drug Use: No  . Sexually Active: Not Currently   Other Topics Concern  . Not on file   Social History Narrative   Lives with his mother at home. Patient is single and retired, receive SS check.  Went to college in [place] - majored in music          Review of Systems: As per HPI, otherwise negative  Physical Exam: Vital signs in last 24 hours: Temp:  [97.1 F (36.2 C)-97.9 F (36.6 C)] 97.5 F (36.4 C) (03/23 0643) Pulse Rate:  [50-67] 55 (03/23 0643) Resp:  [16-18] 18 (03/23 0643) BP: (97-137)/(54-78) 105/63 mmHg (03/23 0643) SpO2:  [92 %-100 %] 92 % (03/23 0643) Weight:  [188 lb 4.4 oz (85.4 kg)-191 lb 9.3 oz (86.9 kg)] 191 lb 9.3 oz (86.9 kg) (03/23 0643) Last BM Date: 03/20/13 Gen: awake, alert, NAD HEENT: anicteric, op  clear CV: RRR, no mrg Pulm: CTA b/l Abd: soft, NT/ND, +BS throughout Ext: no c/c/e Neuro: nonfocal   Intake/Output from previous day: 03/22 0701 - 03/23 0700 In: 2335.7 [I.V.:2141.7; Blood:194] Out: 600 [Urine:600] Intake/Output this shift:    Lab Results:  Recent Labs  03/21/13 1708 03/21/13 2056 03/22/13 0630  WBC 4.5 3.8* 4.6  HGB 11.3* 10.8* 11.4*  HCT 32.3* 30.7* 32.3*  PLT 52* 43* 70*   BMET  Recent Labs  03/21/13 1323 03/22/13 0630  NA 134* 137  K 4.6 3.8  CL 101 105  CO2  23 21  GLUCOSE 120* 48*  BUN 38* 29*  CREATININE 1.23 1.19  CALCIUM 9.8 8.8   LFT  Recent Labs  03/22/13 0630  PROT 5.9*  ALBUMIN 2.8*  AST 48*  ALT 24  ALKPHOS 50  BILITOT 1.3*   PT/INR  Recent Labs  03/21/13 1324  LABPROT 15.3*  INR 1.23    Previous Endoscopies: Colonoscopy 11/2012 - Dr. Marina Goodell --  healthy colonic anastomosis, diminutive ascending colon polyp (adenoma) EGD 02/2012 - Dr. 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 Juanda Chance -- distal esophagitis, grade 1 varices, portal hypertensive gastropathy, hiatus hernia, normal duodenum  Impression/ Recommendations: 72 y.o. 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  1.  Hematemesis/hx of cirrhosis with portal HTN/mild new anemia -- Given the patient's history and presentation I  recommend upper endoscopy for further evaluation. He has been placed on twice daily IV PPI and on octreotide infusion. He has remained hemodynamically stable which is reassuring. He's also had no further evidence of active bleeding in the past 2 days. Further recommendations to be made after upper endoscopy. I recommend a short course of oral ciprofloxacin 400 mg twice a day for prophylaxis in the setting of cirrhosis with GI hemorrhage.    LOS: 1 day   Aydeen Blume M  03/22/2013, 9:24 AM

## 2013-03-22 NOTE — Progress Notes (Signed)
Subjective: No further emesis or dark tarry BM.  Minimal abdominal pain.  Felt itchy all over after platelet infusion but improved now. No further complaints.  Hypoglycemic this morning, but given half amp of D50.  Called to go down to endo suite once CBG improved.  Objective: Vital signs in last 24 hours: Filed Vitals:   03/22/13 0100 03/22/13 0131 03/22/13 0215 03/22/13 0643  BP: 117/69 128/78 137/75 105/63  Pulse: 50 50 60 55  Temp: 97.4 F (36.3 C) 97.6 F (36.4 C) 97.3 F (36.3 C) 97.5 F (36.4 C)  TempSrc: Oral Oral Oral Oral  Resp: 18 18 18 18   Height:      Weight:    191 lb 9.3 oz (86.9 kg)  SpO2:    92%   Weight change:   Intake/Output Summary (Last 24 hours) at 03/22/13 1610 Last data filed at 03/22/13 0700  Gross per 24 hour  Intake 2335.67 ml  Output    600 ml  Net 1735.67 ml   General: resting in bed, pleasant, no acute distress HEENT: PERRL, EOMI, mild scleral icterus, tongue moist without white plaque Cardiac: RRR, no rubs, murmurs or gallops Pulm: clear to auscultation bilaterally, moving normal volumes of air Abd: soft, nontender, nondistended, BS normoactive Ext: warm and well perfused, no pedal edema Neuro: alert and oriented X3, cranial nerves II-XII grossly intact  Lab Results: Basic Metabolic Panel:  Recent Labs Lab 03/21/13 1323 03/22/13 0630  NA 134* 137  K 4.6 3.8  CL 101 105  CO2 23 21  GLUCOSE 120* 48*  BUN 38* 29*  CREATININE 1.23 1.19  CALCIUM 9.8 8.8   Liver Function Tests:  Recent Labs Lab 03/21/13 1323 03/22/13 0630  AST 54* 48*  ALT 28 24  ALKPHOS 53 50  BILITOT 1.1 1.3*  PROT 6.2 5.9*  ALBUMIN 2.9* 2.8*    Recent Labs Lab 03/21/13 1713  LIPASE 43   CBC:  Recent Labs Lab 03/21/13 1323  03/21/13 2056 03/22/13 0630  WBC 4.7  < > 3.8* 4.6  NEUTROABS 2.6  --   --   --   HGB 11.5*  < > 10.8* 11.4*  HCT 32.8*  < > 30.7* 32.3*  MCV 99.1  < > 100.0 98.8  PLT 62*  < > 43* 70*  < > = values in this interval  not displayed. CBG:  Recent Labs Lab 03/21/13 1836 03/21/13 2144 03/22/13 0748 03/22/13 0815 03/22/13 0908  GLUCAP 86 109* 49* 53* 96   Coagulation:  Recent Labs Lab 03/21/13 1324  LABPROT 15.3*  INR 1.23   Anemia Panel:  Recent Labs Lab 03/21/13 1708  VITAMINB12 1035*  FOLATE >20.0  FERRITIN 109   Alcohol Level:  Recent Labs Lab 03/21/13 1708  ETH <11   Urinalysis:  Recent Labs Lab 03/21/13 1248  COLORURINE YELLOW  LABSPEC 1.020  PHURINE 7.0  GLUCOSEU NEGATIVE  HGBUR NEGATIVE  BILIRUBINUR NEGATIVE  KETONESUR NEGATIVE  PROTEINUR NEGATIVE  UROBILINOGEN 0.2  NITRITE NEGATIVE  LEUKOCYTESUR NEGATIVE   Micro Results: No results found for this or any previous visit (from the past 240 hour(s)). Studies/Results: No results found. Medications: I have reviewed the patient's current medications. Scheduled Meds: . pantoprazole (PROTONIX) IV  40 mg Intravenous Q12H  . sodium chloride  3 mL Intravenous Q12H   Continuous Infusions: . sodium chloride 125 mL/hr at 03/21/13 1352  . octreotide (SANDOSTATIN) infusion 50 mcg/hr (03/21/13 1946)   PRN Meds:.acetaminophen, acetaminophen  Assessment/Plan: 72 yo M with history of  colon, prostate and tonsillar cancers, as well as DM, cirrhosis, HTN, gastritis, mesenteric vein thrombosis and pancreastitis and esophagitis who presents of 03/21/13 with dark emesis.   #Abdominal Pain with nausea, hemetemsis and tarry stools: Resolved and remains asymptomatic & hemodynamically stable (with asymptomatic bradycardia overnight), though FOBT+.  No acute events on tele. Negative orthostatics. Negative lipase. -GI to scope patient this morning, appreciate GI input -EGD may clarify if patient has thrush - will hold diflucan until after scope  -Cont IVF, hold antihypertensives -Continue Protonix 40mg  IV BID, Octreotide gtt given history of varices   #Normocytic anemia: Unclear baseline given recent bleed within the last year;  stable this morning; FOBT +. 11/2012 colonoscopy with polyp (tubular adenoma), diverticulosis, radiation proctitis with plans to repeat in 3 years (does have history of colon ca).  Ferritin, B12 and folate wnl. -AM CBC, sooner if sx  #DM: Most recent A1c = 7.4 (01/28/13)  -CBGs ACHS -d/c SSI given hypoglycemia   #Chronic Thrombocytopenia: s/p 1 unit of platelets overnight; unclear etiology, may be related to cirrhosis, smear in 02/14/12 with large platelets. HIV negative.  #Hypoalbuminemia : likely related to poor PO intake in setting of chronic disease   #VTE: SCDs   Dispo: Disposition is deferred at this time, awaiting improvement of current medical problems.  Anticipated discharge in approximately 1-2 day(s).   The patient does have a current PCP (BROWN, RYAN, MD), therefore will be requiring OPC follow-up after discharge.   The patient does not have transportation limitations that hinder transportation to clinic appointments.  .Services Needed at time of discharge: Y = Yes, Blank = No PT:   OT:   RN:   Equipment:   Other:     LOS: 1 day   Lauramae Kneisley 03/22/2013, 9:22 AM

## 2013-03-23 ENCOUNTER — Encounter (HOSPITAL_COMMUNITY): Payer: Self-pay | Admitting: Internal Medicine

## 2013-03-23 ENCOUNTER — Telehealth: Payer: Self-pay

## 2013-03-23 LAB — PREPARE PLATELET PHERESIS

## 2013-03-23 NOTE — Discharge Summary (Signed)
Prior discharge summary deleted in error.  Initial d/c summary written by Eddie [name] [name], MSIV, which I have revised and made amendments to.  Please see below. Eddie [name] Gardner, MD  Internal Medicine Teaching [facility] Early Discharge Note.  Patient Name: Eddie [name] [name] MRN: [id number]  DOB: 1941/07/03  PCP: Eddie [name] Harder, MD  Date of Admission: 03/21/2013  Date of Discharge: 03/22/2013  Attending Physician: Eddie Catalina, MD  DISCHARGE DIAGNOSES:  1. Cirrhosis 2. Hepatitis C 3. Chronic alcohol abuse 4. Hypertension 5. Gastritis 6. Reflux esophagitis 7. 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 Colorectal cancer 11. Prostate cancer 12. Thrombocytopenia 13. Anemia 14. Chronic kidney disease 15. Portal hypertensive gastropathy 16.  DISPOSITION AND FOLLOW-UP:  Eddie [name] [name] is to follow-up with the listed providers as detailed below, at which time, the following should be addressed:  1. Please follow-up [name] : Schedule made by [facility] on 04/22/13 @ 10:45am with Dr. Marina [name] (letter mailed to pt per Epic note) -Labs / imaging needed at time of follow-up: CBC  -Follow up regarding compliance with PPI & Cipro   2. [facility] OPC --> follow up CBGs (lantus decreased to half dose at discharge)   DISCHARGE INSTRUCTIONS:   Discharge Orders    Future Appointments  Provider  Department  Dept Phone    04/22/2013 10:45 AM  Eddie [name] Fredrickson, MD  [facility]  [phone]    11/25/2013 11:00 AM  Si Gaul, MD  [facility] MEDICAL ONCOLOGY  [phone]    11/25/2013 11:30 AM  Dava Najjar Idelle Jo  [facility] MEDICAL ONCOLOGY  [phone]    11/25/2013 12:00 PM  [address]  [facility]  [phone]    Patient to arrive 15 minutes prior to appointment time    Future Orders  Complete By  Expires     Call MD for: extreme fatigue  As directed      Call MD for: persistant dizziness or  light-headedness  As directed      Call MD for: persistant nausea and vomiting  As directed      Call MD for: severe uncontrolled pain  As directed      Call MD for: temperature >100.4  As directed      Diet - low sodium heart healthy  As directed      Discharge instructions  As directed      Comments:     -Please start taking Ciprofloxacin 500mg  twice daily for 5 days.  -Please start taking protonix 40mg  twice daily for 8 weeks, then continue at once daily indefinitely.  -Please do not take any NSAIDs - that means NO good powders, aspirin, advil, ibuprofen, aleve, naproxen, motrin!!!!!!!!!!  -Please be sure to follow up with Dr. Lauro [name] office ([facility]) within the next week, as well as the Outpatient Clinics.  -If your blood sugar before bed time is greater than 200, please take Lantus 20units before bed. If your blood sugar is less than 200, hold insulin until your hospital follow up with your primary care doctor. Please check your blood sugar 2-3 times daily, and whenever you feel ill.     Increase activity slowly  As directed        DISCHARGE MEDICATIONS:    Medication List     TAKE these medications       ACCU-CHEK FASTCLIX LANCETS Misc    1 each by Does not apply route 2 (two) times daily.  ciprofloxacin 500 MG tablet    Commonly known as: CIPRO    Take 1 tablet (500 mg total) by mouth 2 (two) times daily.    ferrous sulfate 325 (65 FE) MG tablet    Take 325 mg by mouth 2 (two) times daily with a meal.    glucose blood test strip    Commonly known as: ACCU-CHEK SMARTVIEW    Use as instructed    insulin glargine 100 UNIT/ML injection    Commonly known as: LANTUS    Inject 20 units into the skin subcutaneously at night.    lisinopril 10 MG tablet    Commonly known as: PRINIVIL,ZESTRIL    Take 1 tablet (10 mg total) by mouth daily.    multivitamin tablet    Take 1 tablet by mouth daily.    nadolol 80 MG tablet    Commonly known as: CORGARD    Take 1 tablet (80 mg  total) by mouth daily.    pantoprazole 40 MG tablet    Commonly known as: PROTONIX    Take 1 tablet (40 mg total) by mouth 2 (two) times daily before a meal.      CONSULTS:  Treatment Team:  Eddie [name] Fiedler, MD  PROCEDURES PERFORMED:  EGD (03/22/13)  ENDOSCOPIC IMPRESSION:  1. Erosive/ulcerative esophagitis in the middle and distal third  2. There were 3 columns of small varices in the distal third of  the esophagus; no stigmata  3. Portal hypertensive gastropathy was found in the cardia,  gastric fundus, and on the anterior wall of the gastric body  4. There was mild antral gastropathy noted  5. Hiatus hernia was found  6. Gastric varices without stigmata  6. The duodenal mucosa showed no abnormalities in the bulb and  second portion of the duodenum  ADMISSION DATA:  H&P:  72 yo M with hisotry of gastritis, esophagitis with varices, cirrhosis, hep C, and pancreatitis presents on 03/21/13 with complaints of nausea & abdominal pain 2 days ago followed by a dark, tarry bowel movement and then 1 episode of dark bloody emesis. No BM or emesis since then. Abdominal pain described as left upper quadrant pain then radiated to his epigastric region- felt like an upset stomach, possibly crampy, 5-6/10, currently 2/10. Felt better after BM. Not usually constipated. Takes iron daily. Usually has a BM daily. He reports generalized weakness & weight less. He has tried pepto-bismol, milk of magnesia and tums for relief after he noticed black bowel mvmts and dark emesis.  He hasn't eaten in 2 days (except glucerna in the ED). No appetite so cannot correlate to food. Was able to drink water well yesterday. No dizziness, blurry vision, shortness of breath or chest pain. Only takes medications that the MD prescribes, no ibuprofen/asa/EtOH.  Of note, he underwent EGD on 02/15/12 which revealed portal gastropathy, gastric varices, gastric ulcer, grade II esophageal varices, a small hiatal hernia and exudate in the  proximal esophagus. Review of meds suggest he is taking all of his medications, but he only has nadolol and lisinopril with him today.  Physical Exam:  Blood pressure 128/69, pulse 67, temperature 97.9 F (36.6 C), temperature source Oral, resp. rate 18, SpO2 99.00%.  General: resting in bed, no acute distress  HEENT: PERRL, EOMI, mild scleral icterus, dry mucous membranes with white exudate on tongue (patient reports he just drank glucerna, able to scrap a minimal amt off tongue)  Cardiac: RRR, no rubs, murmurs or gallops  Pulm: clear to auscultation  bilaterally, moving normal volumes of air  Abd: soft, nontender, nondistended, BS present  Ext: warm and well perfused, no pedal edema  Skin: +tenting  Neuro: alert and oriented X3, cranial nerves II-XII grossly intact  Labs:  Basic Metabolic Panel:  Recent Labs   03/21/13 1323   NA  134*   K  4.6   CL  101   CO2  23   GLUCOSE  120*   BUN  38*   CREATININE  1.23   CALCIUM  9.8   Liver Function Tests:  Recent Labs   03/21/13 1323   AST  54*   ALT  28   ALKPHOS  53   BILITOT  1.1   PROT  6.2   ALBUMIN  2.9*   CBC:  Recent Labs   03/21/13 1323   WBC  4.7   NEUTROABS  2.6   HGB  11.5*   HCT  32.8*   MCV  99.1   PLT  62*   Coagulation:  Recent Labs   03/21/13 1324   LABPROT  15.3*   INR  1.23   HOSPITAL COURSE:  Mr. Spurgeon presented following an isolated episode of hematemesis and was admitted to the medicine teaching service for further evaluation and management  # Hematemesis. The pt presented with abdominal pain, melena, and an isolated episode of bloody emesis 2 days prior to admission. He was mildly hypotensive with clinical signs of dehydration, but vitals were otherwise stable. He was mildly anemic (Hgb 11.5), and FOBT was positive. The exact etiology was unclear as prior EGD from 2013 had numerous findings including esophagitis, esophageal and gastric varices, and portal hypertensive gastropathy. IV protonix  and octreotide were started, and IV fluids were provided. GI was consulted and performed an EGD the following day, which identified the same findings as the previous EGD but did not identify any acute bleed. Hemoglobin was closely monitored throughout the hospitalization and remained stable (discharge Hgb 11.4). His abdominal pain resolved, he had no further n/v, and he was discharged in stable condition. He was instructed to continue Nadolol and was prescribed bid protonix and ciprofloxacin (500mg  bid x5d) per GI recommendations. He was told to schedule a follow-up with Dr. Marina [name] following discharge.  # Anemia- The pt had a mild normocytic (MCV 99) anemia at the time of admission, although this appeared to be his baseline from past visits. Folate and Ferritin were wnl (pt does take iron supplements), and B12 levels were elevated. Possibly due to chronic blood loss or chronic liver disease.  # Sinus Bradycardia- The pt was noted to be in sinus bradycardia throughout the hospitalization (HR 40s-50s). This had been identified during previous hospitalizations. He remained asymptomatic, and was monitored closely via telemetry until discharge. On chronic beta blocker.  # Diabetes- The pt's most recent HgbA1c at admission was 7.4 (12/2012). His home insulin (Lantus 43U qhs) was held throughout the hospitalization as he was not eating at the time of admission. No SSI was given as the pt's glucose levels were well-controlled. He was instructed to monitor his glucose levels and only take bedtime insulin for glucose>200 (instructed to take 20U). He will be set up with an appointment in the [facility] outpatient clinic to assess need for further changes to his insulin regimen.  # Thrombocytopenia- The pt's platelets were low at the time of admission (62), but this had been noted through many of his prior hospitalizations/clinic visits. He was given 1U platelets following admission given recent  GI bleed and further decrease  in platelets (43). The etiology of this remains unclear. HIV test was negative. It was thought that the thrombocytopenia is secondary to chronic liver disease. He had no bleeding events while hospitalized, and platelets were 70 at the time of discharge.  # Cirrhosis- The pt's AST at the time of admission was mildly elevated (53), but the remainder of the LFTs wnl. However, He does have sequelae from cirrhosis and does not report following with a GI doctor as an outpatient. He will be scheduled to see Dr. Rhea Belton and [facility] after discharge.  DISCHARGE DATA:  Vital Signs:  BP 104/62  Pulse 55  Temp(Src) 97.6 F (36.4 C) (Oral)  Resp 14  Ht 6' (1.829 m)  Wt 86.9 kg (191 lb 9.3 oz)  BMI 25.98 kg/m2  SpO2 99%  Labs:  Results for orders placed during the hospital encounter of 03/21/13 (from the past 24 hour(s))   URINALYSIS, MICROSCOPIC ONLY Status: None    Collection Time    03/21/13 12:48 PM   Result  Value  Range    Color, Urine  YELLOW  YELLOW    APPearance  CLEAR  CLEAR    Specific Gravity, Urine  1.020  1.005 - 1.030    pH  7.0  5.0 - 8.0    Glucose, UA  NEGATIVE  NEGATIVE mg/dL    Hgb urine dipstick  NEGATIVE  NEGATIVE    Bilirubin Urine  NEGATIVE  NEGATIVE    Ketones, ur  NEGATIVE  NEGATIVE mg/dL    Protein, ur  NEGATIVE  NEGATIVE mg/dL    Urobilinogen, UA  0.2  0.0 - 1.0 mg/dL    Nitrite  NEGATIVE  NEGATIVE    Leukocytes, UA  NEGATIVE  NEGATIVE    WBC, UA  0-2  <3 WBC/hpf    RBC / HPF  0-2  <3 RBC/hpf   OCCULT BLOOD, POC DEVICE Status: Abnormal    Collection Time    03/21/13 1:15 PM   Result  Value  Range    Fecal Occult Bld  POSITIVE (*)  NEGATIVE   CBC WITH DIFFERENTIAL Status: Abnormal    Collection Time    03/21/13 1:23 PM   Result  Value  Range    WBC  4.7  4.0 - 10.5 K/uL    RBC  3.31 (*)  4.22 - 5.81 MIL/uL    Hemoglobin  11.5 (*)  13.0 - 17.0 g/dL    HCT  40.9 (*)  81.1 - 52.0 %    MCV  99.1  78.0 - 100.0 fL    MCH  34.7 (*)  26.0 - 34.0 pg    MCHC  35.1   30.0 - 36.0 g/dL    RDW  91.4  78.2 - 95.6 %    Platelets  62 (*)  150 - 400 K/uL    Neutrophils Relative  54  43 - 77 %    Neutro Abs  2.6  1.7 - 7.7 K/uL    Lymphocytes Relative  26  12 - 46 %    Lymphs Abs  1.2  0.7 - 4.0 K/uL    Monocytes Relative  16 (*)  3 - 12 %    Monocytes Absolute  0.7  0.1 - 1.0 K/uL    Eosinophils Relative  4  0 - 5 %    Eosinophils Absolute  0.2  0.0 - 0.7 K/uL    Basophils Relative  0  0 - 1 %  Basophils Absolute  0.0  0.0 - 0.1 K/uL   COMPREHENSIVE METABOLIC PANEL Status: Abnormal    Collection Time    03/21/13 1:23 PM   Result  Value  Range    Sodium  134 (*)  135 - 145 mEq/L    Potassium  4.6  3.5 - 5.1 mEq/L    Chloride  101  96 - 112 mEq/L    CO2  23  19 - 32 mEq/L    Glucose, Bld  120 (*)  70 - 99 mg/dL    BUN  38 (*)  6 - 23 mg/dL    Creatinine, Ser  1.61  0.50 - 1.35 mg/dL    Calcium  9.8  8.4 - 10.5 mg/dL    Total Protein  6.2  6.0 - 8.3 g/dL    Albumin  2.9 (*)  3.5 - 5.2 g/dL    AST  54 (*)  0 - 37 U/L    ALT  28  0 - 53 U/L    Alkaline Phosphatase  53  39 - 117 U/L    Total Bilirubin  1.1  0.3 - 1.2 mg/dL    GFR calc non Af Amer  58 (*)  >90 mL/min    GFR calc Af Amer  67 (*)  >90 mL/min   PROTIME-INR Status: Abnormal    Collection Time    03/21/13 1:24 PM   Result  Value  Range    Prothrombin Time  15.3 (*)  11.6 - 15.2 seconds    INR  1.23  0.00 - 1.49   TYPE AND SCREEN Status: None    Collection Time    03/21/13 1:30 PM   Result  Value  Range    ABO/RH(D)  B POS     Antibody Screen  NEG     Sample Expiration  03/24/2013    ETHANOL Status: None    Collection Time    03/21/13 5:08 PM   Result  Value  Range    Alcohol, Ethyl (B)  <11  0 - 11 mg/dL   CBC Status: Abnormal    Collection Time    03/21/13 5:08 PM   Result  Value  Range    WBC  4.5  4.0 - 10.5 K/uL    RBC  3.22 (*)  4.22 - 5.81 MIL/uL    Hemoglobin  11.3 (*)  13.0 - 17.0 g/dL    HCT  09.6 (*)  04.5 - 52.0 %    MCV  100.3 (*)  78.0 - 100.0 fL    MCH   35.1 (*)  26.0 - 34.0 pg    MCHC  35.0  30.0 - 36.0 g/dL    RDW  40.9  81.1 - 91.4 %    Platelets  52 (*)  150 - 400 K/uL   FERRITIN Status: None    Collection Time    03/21/13 5:08 PM   Result  Value  Range    Ferritin  109  22 - 322 ng/mL   FOLATE Status: None    Collection Time    03/21/13 5:08 PM   Result  Value  Range    Folate  >20.0    VITAMIN B12 Status: Abnormal    Collection Time    03/21/13 5:08 PM   Result  Value  Range    Vitamin B-12  1035 (*)  211 - 911 pg/mL   HIV ANTIBODY (ROUTINE TESTING) Status: None    Collection Time  03/21/13 5:08 PM   Result  Value  Range    HIV  NON REACTIVE  NON REACTIVE   LIPASE, BLOOD Status: None    Collection Time    03/21/13 5:13 PM   Result  Value  Range    Lipase  43  11 - 59 U/L   GLUCOSE, CAPILLARY Status: None    Collection Time    03/21/13 6:36 PM   Result  Value  Range    Glucose-Capillary  86  70 - 99 mg/dL   CBC Status: Abnormal    Collection Time    03/21/13 8:56 PM   Result  Value  Range    WBC  3.8 (*)  4.0 - 10.5 K/uL    RBC  3.07 (*)  4.22 - 5.81 MIL/uL    Hemoglobin  10.8 (*)  13.0 - 17.0 g/dL    HCT  16.1 (*)  09.6 - 52.0 %    MCV  100.0  78.0 - 100.0 fL    MCH  35.2 (*)  26.0 - 34.0 pg    MCHC  35.2  30.0 - 36.0 g/dL    RDW  04.5  40.9 - 81.1 %    Platelets  43 (*)  150 - 400 K/uL   GLUCOSE, CAPILLARY Status: Abnormal    Collection Time    03/21/13 9:44 PM   Result  Value  Range    Glucose-Capillary  109 (*)  70 - 99 mg/dL   PREPARE PLATELET PHERESIS Status: None    Collection Time    03/21/13 11:17 PM   Result  Value  Range    Unit Number  [id number]     Blood Component Type  PLTPHER LR2     Unit division  00     Status of Unit  ISSUED     Transfusion Status  OK TO TRANSFUSE    COMPREHENSIVE METABOLIC PANEL Status: Abnormal    Collection Time    03/22/13 6:30 AM   Result  Value  Range    Sodium  137  135 - 145 mEq/L    Potassium  3.8  3.5 - 5.1 mEq/L    Chloride  105  96 - 112  mEq/L    CO2  21  19 - 32 mEq/L    Glucose, Bld  48 (*)  70 - 99 mg/dL    BUN  29 (*)  6 - 23 mg/dL    Creatinine, Ser  8.65  0.50 - 1.35 mg/dL    Calcium  8.8  8.4 - 10.5 mg/dL    Total Protein  5.9 (*)  6.0 - 8.3 g/dL    Albumin  2.8 (*)  3.5 - 5.2 g/dL    AST  48 (*)  0 - 37 U/L    ALT  24  0 - 53 U/L    Alkaline Phosphatase  50  39 - 117 U/L    Total Bilirubin  1.3 (*)  0.3 - 1.2 mg/dL    GFR calc non Af Amer  61 (*)  >90 mL/min    GFR calc Af Amer  70 (*)  >90 mL/min   CBC Status: Abnormal    Collection Time    03/22/13 6:30 AM   Result  Value  Range    WBC  4.6  4.0 - 10.5 K/uL    RBC  3.27 (*)  4.22 - 5.81 MIL/uL    Hemoglobin  11.4 (*)  13.0 - 17.0 g/dL  HCT  32.3 (*)  39.0 - 52.0 %    MCV  98.8  78.0 - 100.0 fL    MCH  34.9 (*)  26.0 - 34.0 pg    MCHC  35.3  30.0 - 36.0 g/dL    RDW  40.9  81.1 - 91.4 %    Platelets  70 (*)  150 - 400 K/uL   GLUCOSE, CAPILLARY Status: Abnormal    Collection Time    03/22/13 7:48 AM   Result  Value  Range    Glucose-Capillary  49 (*)  70 - 99 mg/dL   GLUCOSE, CAPILLARY Status: Abnormal    Collection Time    03/22/13 8:15 AM   Result  Value  Range    Glucose-Capillary  53 (*)  70 - 99 mg/dL   GLUCOSE, CAPILLARY Status: None    Collection Time    03/22/13 9:08 AM   Result  Value  Range    Glucose-Capillary  96  70 - 99 mg/dL    Time spent on discharge: 25 minutes  Signed:  Stacy Gardner, MD  03/22/2013, 12:40 PM

## 2013-03-23 NOTE — Telephone Encounter (Signed)
Pt scheduled to see Dr. Marina Goodell 04/22/13@10 :45am. Letter mailed to pt.

## 2013-03-23 NOTE — Progress Notes (Signed)
Reviewed note by Alphia Kava. Please see my additional note for further details.   Stacy Gardner, MD

## 2013-03-23 NOTE — Telephone Encounter (Signed)
Message copied by Chrystie Nose on Mon Mar 23, 2013  9:57 AM ------      Message from: Beverley Fiedler      Created: Mon Mar 23, 2013  8:25 AM       Hospital over the weekend.      EGD      Needs OV for followup within 4-6 weeks, belongs to [facility] I believe ------

## 2013-03-24 NOTE — Discharge Summary (Signed)
I saw Eddie [name] on day of discharge and assisted in the discharge planning as noted above in Dr. Aline August note.

## 2013-03-24 NOTE — Progress Notes (Signed)
Utilization review complete 

## 2013-04-01 ENCOUNTER — Ambulatory Visit (INDEPENDENT_AMBULATORY_CARE_PROVIDER_SITE_OTHER): Payer: Medicare Other | Admitting: Radiation Oncology

## 2013-04-01 ENCOUNTER — Encounter: Payer: Self-pay | Admitting: Radiation Oncology

## 2013-04-01 VITALS — BP 134/81 | HR 69 | Temp 97.2°F | Ht 72.0 in | Wt 195.6 lb

## 2013-04-01 DIAGNOSIS — E1165 Type 2 diabetes mellitus with hyperglycemia: Secondary | ICD-10-CM

## 2013-04-01 DIAGNOSIS — I8510 Secondary esophageal varices without bleeding: Secondary | ICD-10-CM

## 2013-04-01 DIAGNOSIS — K7460 Unspecified cirrhosis of liver: Secondary | ICD-10-CM

## 2013-04-01 DIAGNOSIS — I10 Essential (primary) hypertension: Secondary | ICD-10-CM

## 2013-04-01 NOTE — Patient Instructions (Signed)
Esophageal Varices Esophageal varices are blood vessels in the esophagus (the tube that carries food to the stomach). Under normal circumstances, these blood vessels carry very small amounts of blood. If the liver is damaged, and the main vein (portal vein) that carries blood is blocked, larger amounts of blood might back up into these esophageal varices. The esophageal varices are too fragile for this extra blood flow and pressure. They may swell and then break, causing life-threatening bleeding (hemorrhage). CAUSES  Any kind of liver disease can cause esophageal varices. Cirrhosis of the liver, usually due to alcoholism, is the most common reason. Other reasons include:  Severe heart failure: When the heart cannot pump blood around the body effectively enough, pressure may rise in the portal vein.  A blood clot in the portal vein.  Sarcoidosis. This is an inflammatory disease that can affect the liver.  Schistosomiasis. A parasitic infection that can cause liver damage. SYMPTOMS  Symptoms may include:  Vomiting bright red or black coffee ground like material.  Black, tarry stools.  Low blood pressure.  Dizziness.  Loss of consciousness. DIAGNOSIS  When someone has known cirrhosis, their caregiver may screen them for the presence of esophageal varices. Tests that are used include:  Endoscopy (esophagogastroduodenoscopy or EGD). A thin, lighted tube is inserted through the mouth and into the esophagus. The caregiver will rate the varices according to their size and the presence of red streaks. These characteristics help determine the risk of bleeding.  Imaging tests. CT scans and MRI scans can both show esophageal varices. However, they cannot predict likelihood of bleeding. TREATMENT  There are different types of treatment used for esophageal varices. These include:  Variceal ligation. During EGD, the caregiver places a rubber band around the vein to prevent bleeding.  Injection  therapy. During EGD, the caregiver can inject the veins with a solution that shrinks them and scars them closed.  Medications can decrease the pressure in the esophageal varices and prevent bleeding.  Balloon tamponade. A tube is put into the esophagus and a balloon is passed down it. When the balloon is inflated, it puts pressures on the veins and stops the bleeding.  Shunt. A small tube is placed within the liver veins. This decreases the blood flow and pressure to the varices, decreasing bleeding risk.  Liver transplant may be done as a last resort. HOME CARE INSTRUCTIONS   Take all medications exactly as directed.  Follow any prescribed diet. Avoid alcohol if recommended.  Follow instructions regarding both rest and physical activity.  Seek help to treat a drinking problem. SEEK IMMEDIATE MEDICAL CARE IF:  You vomit blood or coffee-ground material.  You pass black tarry stools or bright red blood in the stools.  You are dizzy, lightheaded or faint.  You are unable to eat or drink.  You experience chest pain. Document Released: 03/08/2004 Document Revised: 03/10/2012 Document Reviewed: 02/17/2009 [facility] Patient Information 2013 [place], [place].

## 2013-04-01 NOTE — Progress Notes (Signed)
Subjective:    Patient ID: Eddie [name], male    DOB: 1941-02-23, 72 y.o.   MRN: [id number]  HPI Patient is a 72 year old man with PMH significant for hepatic cirrhosis with esophageal varices (for comprehensive PMH see below) who presents to clinic today for a hospital followup visit. The patient had presented to the ED on 03/21/2013 with complaints of hematemesis thought to be secondary to bleeding of esophageal varices. The patient had an EGD performed, which revealed gastric and esophageal varices as previously identified on EGD in 2013, however no active bleeding was identified. His hemoglobin = 11.4 at time of discharge.  The patient states that since he's been discharged he completed his course of ciprofloxacin and has been taking all of his medication regularly, including nadolol and his PPI. He denies any hematemesis since discharge on 03/23/2013. He similarly denies any nausea, vomiting or diarrhea. He has been eating meals regularly and feels he is at his baseline. He has no active complaints.  The patient brought his glucose meter to clinic today, which reveals he has been checking his blood glucose slightly less often than once per day, with an average glucose = 172 with no episodes of significant hyperglycemia or hypoglycemia.  Review of Systems  All other systems reviewed and are negative.   Current Outpatient Medications: Current Outpatient Prescriptions  Medication Sig Dispense Refill  . ACCU-CHEK FASTCLIX LANCETS MISC 1 each by Does not apply route 2 (two) times daily.  102 each  6  . ferrous sulfate 325 (65 FE) MG tablet Take 325 mg by mouth 2 (two) times daily with a meal.      . glucose blood (ACCU-CHEK SMARTVIEW) test strip Use as instructed  100 each  12  . insulin glargine (LANTUS) 100 UNIT/ML injection Inject 20 units into the skin subcutaneously at night.  10 mL  11  . lisinopril (PRINIVIL,ZESTRIL) 10 MG tablet Take 1 tablet (10 mg total) by mouth daily.  30  tablet  11  . Multiple Vitamin (MULTIVITAMIN) tablet Take 1 tablet by mouth daily.  30 tablet  3  . nadolol (CORGARD) 80 MG tablet Take 1 tablet (80 mg total) by mouth daily.  30 tablet  11  . pantoprazole (PROTONIX) 40 MG tablet Take 1 tablet (40 mg total) by mouth 2 (two) times daily before a meal.  60 tablet  1   No current facility-administered medications for this visit.    Allergies: No Known Allergies   Past Medical History: Past Medical History  Diagnosis Date  . Cancer     Status post curative radiotherapy with IMRT and androgen ablation for the prostate adenocarcinoma, completed December 10, 2008.  Marland Kitchen Cancer of colon     Status post left hemicolectomy at [facility] in November of 2010 revealing T3 N0 M0 colon adenocarcinoma and the patient did not receive adjuvant chemotherapy  . 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  Marland Kitchen Upper GI bleeding     EGD on 01/2011 shows esophagitis, grade 1 varices  . Hypertension   . Cirrhosis   . Anemia     bone marrow biopsy 11/2011,   . Diabetes mellitus   . Hepatitis C   . Gallstone   . Thrush, oral   . GERD (gastroesophageal reflux disease)   . DM gastroparesis   . Gastritis   . Hernia   . Pancreatitis   .  Pancytopenia   . Cataract     Past Surgical History: Past Surgical History  Procedure Laterality Date  . Colon surgery    . Hernia repair    . Esophagogastroduodenoscopy  02/15/2012    Procedure: ESOPHAGOGASTRODUODENOSCOPY (EGD);  Surgeon: Eliezer Bottom., MD,FACG;  Location: [facility] ENDOSCOPY;  Service: Endoscopy;  Laterality: N/A;  . Esophagogastroduodenoscopy N/A 03/22/2013    Procedure: ESOPHAGOGASTRODUODENOSCOPY (EGD);  Surgeon: Beverley Fiedler, MD;  Location: [facility] ENDOSCOPY;  Service: Gastroenterology;  Laterality: N/A;    Family History: Family History  Problem Relation Age of Onset  . Breast  cancer Mother     mother still living and father passed with unsure reason.  . Alzheimer's disease Mother   . Colon cancer Neg Hx   . Esophageal cancer Neg Hx   . Stomach cancer Neg Hx   . Rectal cancer Neg Hx     Social History: History   Social History  . Marital Status: Single    Spouse Name: N/A    Number of Children: N/A  . Years of Education: college   Occupational History  . Music teacher     retired   Social History Main Topics  . Smoking status: Never Smoker   . Smokeless tobacco: Never Used  . Alcohol Use: No     Comment: patient used to drink 1.5 bottle of wine plus some liquor daily for 50 + years and quit drinking 4 years ago  . Drug Use: No  . Sexually Active: Not Currently   Other Topics Concern  . Not on file   Social History Narrative   Lives with his mother at home. Patient is single and retired, receive SS check.  Went to college in [place] - majored in music           Vital Signs: Blood pressure 134/81, pulse 69, temperature 97.2 F (36.2 C), temperature source Oral, height 6' (1.829 m), weight 195 lb 9.6 oz (88.724 kg), SpO2 97.00%.       Objective:   Physical Exam  Constitutional: He is oriented to person, place, and time. He appears well-developed and well-nourished. No distress.  HENT:  Head: Normocephalic and atraumatic.  Eyes: Conjunctivae are normal. Pupils are equal, round, and reactive to light. No scleral icterus.  Neck: Normal range of motion. Neck supple. No tracheal deviation present.  Cardiovascular: Normal rate and regular rhythm.   No murmur heard. Pulmonary/Chest: Effort normal. He has no wheezes. He has no rales.  Abdominal: Soft. Bowel sounds are normal. He exhibits no distension. There is no tenderness.  Musculoskeletal: Normal range of motion. He exhibits no edema.  Neurological: He is alert and oriented to person, place, and time. No cranial nerve deficit.  Skin: Skin is warm and dry. No erythema.  Psychiatric: He  has a normal mood and affect. His behavior is normal.          Assessment & Plan:

## 2013-04-02 NOTE — Assessment & Plan Note (Signed)
Lab Results  Component Value Date   HGBA1C 7.4 01/28/2013   HGBA1C 9.0 10/15/2012   HGBA1C 12.3 06/20/2012     Assessment:  Diabetes control: fair control  Progress toward A1C goal:  improved  Comments: patient is doing a fairly good job of checking his CBGs at home. Most CBGs average between 150-200. Patient states he's been fully compliant with his insulin, although he admits to taking occasional lower doses when his sugars are in the mid to low 100s, but denies having any symptoms of hypoglycemia. As his A1c improved markedly over the last few months, it appears he is effectively managing his DM, and suspect his next A1c will be < 7 if he continues to be fully compliant.   Plan:  Medications:  continue current medications  Home glucose monitoring:   Frequency:     Timing:    Instruction/counseling given: reminded to bring blood glucose meter & log to each visit  Educational resources provided: brochure  Self management tools provided: copy of home glucose meter download  Other plans:

## 2013-04-02 NOTE — Assessment & Plan Note (Signed)
BP Readings from Last 3 Encounters:  04/01/13 134/81  03/22/13 125/66  03/22/13 125/66    Lab Results  Component Value Date   NA 137 03/22/2013   K 3.8 03/22/2013   CREATININE 1.19 03/22/2013    Assessment:  Blood pressure control: controlled  Progress toward BP goal:  at goal  Comments:   Plan:  Medications:  continue current medications  Educational resources provided:    Self management tools provided:    Other plans:

## 2013-04-02 NOTE — Assessment & Plan Note (Addendum)
Likely etiology of patient's hematemesis which resulted in his recent hospitalization. He has an appointment with Dr. Marina Goodell ([facility]) on 4/23, which he states he will be attending. As there is no evidence of active bleeding today, and the patient denies any hematemesis since discharge, no CBC will need to be collected today to monitor hemoglobin. - cont nadolol - cont PPI

## 2013-04-06 ENCOUNTER — Other Ambulatory Visit: Payer: Self-pay | Admitting: Internal Medicine

## 2013-04-06 NOTE — Progress Notes (Signed)
Case discussed with Dr. McTyre at time of visit.  We reviewed the resident's history and exam and pertinent patient test results.  I agree with the assessment, diagnosis, and plan of care documented in the resident's note. 

## 2013-04-22 ENCOUNTER — Encounter: Payer: Self-pay | Admitting: Internal Medicine

## 2013-04-22 ENCOUNTER — Ambulatory Visit (INDEPENDENT_AMBULATORY_CARE_PROVIDER_SITE_OTHER): Payer: Medicare Other | Admitting: Internal Medicine

## 2013-04-22 VITALS — BP 120/60 | HR 70 | Ht 72.0 in | Wt 196.0 lb

## 2013-04-22 DIAGNOSIS — K7030 Alcoholic cirrhosis of liver without ascites: Secondary | ICD-10-CM

## 2013-04-22 DIAGNOSIS — K2100 Gastro-esophageal reflux disease with esophagitis, without bleeding: Secondary | ICD-10-CM

## 2013-04-22 DIAGNOSIS — Z85038 Personal history of other malignant neoplasm of large intestine: Secondary | ICD-10-CM

## 2013-04-22 DIAGNOSIS — I8500 Esophageal varices without bleeding: Secondary | ICD-10-CM

## 2013-04-22 NOTE — Progress Notes (Signed)
HISTORY OF PRESENT ILLNESS:  Eddie [name] is a 72 y.o. 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 Last seen by me 12/16/2012 for surveillance colonoscopy. Found to have diminutive descending colon polyp and moderate diverticulosis. As well radiation proctitis. The patient was hospitalized from March 22 through 03/22/2013 for hematemesis. Upper GI endoscopy revealed erosive esophagitis in the distal half of the esophagus. Small varices without stigmata. Portal hypertensive gastropathy. Gastric varices. The patient PPI therapy was changed to twice a day. He was continued on nadolol. Strict NSAID avoidance recommended. He has been compliant with medical therapy and brings his medications today. Absolute no GI complaints except for slight decrease in appetite.  REVIEW OF SYSTEMS:  All non-GI ROS negative upon review  Past Medical History  Diagnosis Date  . Cancer     Status post curative radiotherapy with IMRT and androgen ablation for the prostate adenocarcinoma, completed December 10, 2008.  Marland Kitchen Cancer of colon     Status post left hemicolectomy at [facility] in November of 2010 revealing T3 N0 M0 colon adenocarcinoma and the patient did not receive adjuvant chemotherapy  . 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  Marland Kitchen Upper GI bleeding     EGD on 01/2011 shows esophagitis, grade 1 varices  . Hypertension   .  Cirrhosis   . Anemia     bone marrow biopsy 11/2011,   . Diabetes mellitus   . Hepatitis C   . Gallstone   . Thrush, oral   . GERD (gastroesophageal reflux disease)   . DM gastroparesis   . Gastritis   . Hernia   . Pancreatitis   . Pancytopenia   . Cataract   . Gastric varices   . Colon polyp   . Diverticulosis     Past Surgical History  Procedure Laterality Date  . Colon surgery    . Hernia repair    . Esophagogastroduodenoscopy  02/15/2012    Procedure: ESOPHAGOGASTRODUODENOSCOPY (EGD);  Surgeon: Eliezer Bottom., MD,FACG;  Location: [facility] ENDOSCOPY;  Service: Endoscopy;  Laterality: N/A;  . Esophagogastroduodenoscopy N/A 03/22/2013    Procedure: ESOPHAGOGASTRODUODENOSCOPY (EGD);  Surgeon: Beverley Fiedler, MD;  Location: [facility] ENDOSCOPY;  Service: Gastroenterology;  Laterality: N/A;    Social History Eddie [name]  reports that he has never smoked. He has never used smokeless tobacco. He reports that he does not drink alcohol or use illicit drugs.  family history includes Alzheimer's disease in his mother and Breast cancer in his mother.  There is no history of Colon cancer, and Esophageal cancer, and Stomach cancer, and Rectal cancer, .  No Known Allergies     PHYSICAL EXAMINATION: Vital signs: BP 120/60  Pulse 70  Ht 6' (1.829 m)  Wt 196 lb (88.905 kg)  BMI 26.58 kg/m2 General: Well-developed, thin, well-nourished, no acute distress HEENT: Sclerae are anicteric, conjunctiva pink. Oral mucosa intact Lungs: Clear Heart: Regular Abdomen: soft, nontender, nondistended, no obvious ascites, no peritoneal signs, normal bowel sounds. No organomegaly.  Previous surgical incisions well-healed Extremities: No edema Psychiatric: alert and oriented x3. Cooperative    ASSESSMENT:  #1. Recent hospitalization for minor upper GI bleeding secondary to erosive esophagitis. No recurrence on twice a day PPI #2. History of cirrhosis of the liver secondary to alcohol and hepatitis C.  He has portal hypertension. On nadolol for primary variceal prophylaxis #3. History of multiple cancers including colon cancer, tonsillar cancer, and prostate cancer   PLAN:  #1. Continue twice a day PPI indefinitely #2. Continue nadolol for variceal prophylaxis #3. Routine surveillance colonoscopy in 3 years #4. Routine GI office followup in 1 year

## 2013-04-22 NOTE — Patient Instructions (Addendum)
Please follow up with Dr. Perry in one year 

## 2013-04-24 ENCOUNTER — Other Ambulatory Visit: Payer: Self-pay | Admitting: *Deleted

## 2013-04-24 DIAGNOSIS — E119 Type 2 diabetes mellitus without complications: Secondary | ICD-10-CM

## 2013-04-24 MED ORDER — INSULIN GLARGINE 100 UNIT/ML ~~LOC~~ SOLN: SUBCUTANEOUS | Status: DC

## 2013-04-24 NOTE — Telephone Encounter (Signed)
The patient was recently hospitalized.  On admission that hospitalization, he was taking Lantus 43 units daily.  His appetite was decreased during that hospitalization, so he was discharged on Lantus 20 units daily until his appetite improved.  At this time, the patient should continue taking Lantus 43 units daily.  He should be given a follow-up appointment soon, either with Dr. Saralyn Pilar on 5/1 or with me sometime in the next 2-3 weeks if possible.  Thank you.

## 2013-04-24 NOTE — Telephone Encounter (Signed)
Pt calls and states he has a small amt of insulin left and needs refill, instead of 20 units he has been using 43 units, i have looked through the chart and cannot find record of him being told to use 43, i think he may have gotten confused, states he has been using 43 for a little over a week, does he need to drop suddenly back to 20 units? If he needs and appt, which i have already attempted to schedule, it would be Thursday 5/1 before i could think about it- it would go on dr reynolds continuity day, please advise in any case he will need a refill today with new directions in order for insurance to pay, please advise asap, thanks, h.

## 2013-05-12 ENCOUNTER — Encounter: Payer: Self-pay | Admitting: Internal Medicine

## 2013-05-12 ENCOUNTER — Ambulatory Visit (INDEPENDENT_AMBULATORY_CARE_PROVIDER_SITE_OTHER): Payer: Medicare Other | Admitting: Internal Medicine

## 2013-05-12 VITALS — BP 125/74 | HR 99 | Temp 97.8°F | Ht 72.0 in | Wt 195.8 lb

## 2013-05-12 DIAGNOSIS — B351 Tinea unguium: Secondary | ICD-10-CM | POA: Insufficient documentation

## 2013-05-12 DIAGNOSIS — I10 Essential (primary) hypertension: Secondary | ICD-10-CM

## 2013-05-12 DIAGNOSIS — E1165 Type 2 diabetes mellitus with hyperglycemia: Secondary | ICD-10-CM

## 2013-05-12 LAB — POCT GLYCOSYLATED HEMOGLOBIN (HGB A1C): Hemoglobin A1C: 6

## 2013-05-12 NOTE — Patient Instructions (Signed)
Please make a followup appointment in 4-6 weeks.  Check your blood sugar 3 times a day before meals until next visit. Did not check your blood sugar at bedtime as you are doing now. Check it before meals.  Decrease the dose of Lantus to 35 units daily at bedtime.  Keep taking all medications regularly.  Followup with podiatry.

## 2013-05-12 NOTE — Assessment & Plan Note (Signed)
Patient probably has onychomycosis of left big toe. - Has never seen a podiatrist. - Referral placed.

## 2013-05-12 NOTE — Assessment & Plan Note (Signed)
BP Readings from Last 3 Encounters:  05/12/13 125/74  04/22/13 120/60  04/01/13 134/81    Lab Results  Component Value Date   NA 137 03/22/2013   K 3.8 03/22/2013   CREATININE 1.19 03/22/2013    Assessment: Blood pressure control:   at goal  Progress toward BP goal:    stable Comments:   Plan: Medications:  continue current medications, Continue lisinopril and nadolol Educational resources provided:   Self management tools provided:   Other plans: No new changes in medications.

## 2013-05-12 NOTE — Progress Notes (Signed)
  Subjective:    Patient ID: Eddie [name], male    DOB: [birth year], 72 y.o.   MRN: [id number]  HPI patient is a pleasant 72 year old man with multiple cancers, hepatitis, cirrhosis with portal hypertension, diabetes2, hypertension and other problems as per problem list who comes to the clinic for followup visit for diabetes. His A1c today is 6.0 which is better from 7.7 in end of January 2014. He says he takes about 35 units of Lantus at bedtime. His glucose meter reading is discordant with his A1c number- which is because he checks his sugars about 2 hours after his dinner- which has made his numbers high due to postprandial peak.  He denies any symptoms of hypoglycemia. Denies any fever, chills, nausea, vomiting, abdominal pain, chest pain, short of breath, diarrhea, headache, palpitations.  He followed with GI about a week before.  Hit his left foot about a week before and had some pain in left big toe. But no bleeding, ulcer or redness noted. Pain resolved by itself. No previous history of diabetic foot ulcers.  Review of Systems    as per history of present illness. Objective:   Physical Exam General: NAD HEENT: PERRL, EOMI, no scleral icterus Cardiac: S1, S2, RRR, no rubs, murmurs or gallops Pulm: clear to auscultation bilaterally, moving normal volumes of air Abd: soft, nontender, nondistended, BS present Ext: warm and well perfused, no pedal edema. Left big toe nail- yellow discoloration and distorted. Likely onychomycosis. No foot ulcer or bleeding noted. No tenderness to palpation. Neuro: alert and oriented X3, cranial nerves II-XII grossly intact        Assessment & Plan:

## 2013-05-12 NOTE — Assessment & Plan Note (Signed)
Lab Results  Component Value Date   HGBA1C 6.0 05/12/2013   HGBA1C 7.4 01/28/2013   HGBA1C 9.0 10/15/2012     Assessment: Diabetes control:   much improved. At goal Progress toward A1C goal:    significant improvement Comments:   Plan: Medications:  Decrease the dose of Lantus to 35 units at bedtime. Home glucose monitoring: Frequency:   3 times daily Timing:   before meals Instruction/counseling given: reminded to bring blood glucose meter & log to each visit, reminded to bring medications to each visit, discussed foot care and discussed diet Educational resources provided: brochure;video Self management tools provided: copy of home glucose meter download;home glucose logbook Other plans: Patient was checking his glucose once daily about 2 hours after dinner- before bedtime and deciding his Lantus dose depending on that sugar number. He would take 42 units if his sugars were high at 35 but they were less than 200. Advised him not to do that and only checked before meals 3 times a day until next clinic visit in 4-6 weeks. Might need to back off on Lantus after we have pre-meal 3 times a day glucose log during next visit.

## 2013-05-13 NOTE — Progress Notes (Signed)
Case discussed with Dr. Patel immediately after the resident saw the patient.  We reviewed the resident's history and exam and pertinent patient test results.  I agree with the assessment, diagnosis and plan of care documented in the resident's note. 

## 2013-05-13 NOTE — Progress Notes (Signed)
Case discussed with Dr. Illath immediately after the resident saw the patient. We reviewed the resident's history and exam and pertinent patient test results. I agree with the assessment, diagnosis and plan of care documented in the resident's note. 

## 2013-05-22 ENCOUNTER — Other Ambulatory Visit: Payer: Self-pay | Admitting: *Deleted

## 2013-05-23 MED ORDER — PANTOPRAZOLE SODIUM 40 MG PO TBEC: 40.0000 mg | DELAYED_RELEASE_TABLET | Freq: Two times a day (BID) | ORAL | Status: DC

## 2013-06-15 ENCOUNTER — Encounter: Payer: Self-pay | Admitting: Internal Medicine

## 2013-06-15 ENCOUNTER — Ambulatory Visit (INDEPENDENT_AMBULATORY_CARE_PROVIDER_SITE_OTHER): Payer: Medicare Other | Admitting: Internal Medicine

## 2013-06-15 VITALS — BP 103/61 | HR 63 | Temp 97.2°F | Ht 72.0 in | Wt 193.6 lb

## 2013-06-15 DIAGNOSIS — E1121 Type 2 diabetes mellitus with diabetic nephropathy: Secondary | ICD-10-CM

## 2013-06-15 DIAGNOSIS — N058 Unspecified nephritic syndrome with other morphologic changes: Secondary | ICD-10-CM

## 2013-06-15 DIAGNOSIS — I10 Essential (primary) hypertension: Secondary | ICD-10-CM

## 2013-06-15 DIAGNOSIS — E1165 Type 2 diabetes mellitus with hyperglycemia: Secondary | ICD-10-CM

## 2013-06-15 LAB — GLUCOSE, CAPILLARY: Glucose-Capillary: 183 mg/dL — ABNORMAL HIGH (ref 70–99)

## 2013-06-15 NOTE — Patient Instructions (Addendum)
General Instructions: For your diabetes, continue taking Lantus 35 units once per day.  Start checking your blood sugars in the morning, before breakfast, to give [place] a better idea of your insulin requirements. -We will recheck your A1C in 2 months.  Please return for a follow-up visit in 2-3 months.   Treatment Goals:  Goals (1 Years of Data) as of 06/15/13         As of Today 05/12/13 04/22/13 04/01/13 03/22/13     Blood Pressure    . Blood Pressure < 140/90  103/61 125/74 120/60 134/81 125/66     Diet    . Decrease  juice intake          . Reduce portion size             Note created  11/14/2012  2:10 PM by Cecil Cranker Plyler, RD   Of sherbet            Result Component    . HEMOGLOBIN A1C < 7.0   6.0       . LDL CALC < 100            Progress Toward Treatment Goals:  Treatment Goal 06/15/2013  Hemoglobin A1C at goal  Blood pressure at goal    Self Care Goals & Plans:  Self Care Goal 06/15/2013  Manage my medications take my medicines as prescribed; bring my medications to every visit; refill my medications on time  Monitor my health -  Eat healthy foods -    Home Blood Glucose Monitoring 06/15/2013  Check my blood sugar 2 times a day  When to check my blood sugar before meals     Care Management & Community Referrals:  Referral 06/15/2013  Referrals made for care management support none needed

## 2013-06-15 NOTE — Assessment & Plan Note (Signed)
BP Readings from Last 3 Encounters:  06/15/13 103/61  05/12/13 125/74  04/22/13 120/60    Lab Results  Component Value Date   NA 137 03/22/2013   K 3.8 03/22/2013   CREATININE 1.19 03/22/2013    Assessment: Blood pressure control: controlled Progress toward BP goal:  at goal Comments: Well-controlled  Plan: Medications:  continue current medications Educational resources provided:   Self management tools provided:   Other plans: Recheck at next visit

## 2013-06-15 NOTE — Assessment & Plan Note (Signed)
Lab Results  Component Value Date   HGBA1C 6.0 05/12/2013   HGBA1C 7.4 01/28/2013   HGBA1C 9.0 10/15/2012     Assessment: Diabetes control: good control (HgbA1C at goal) Progress toward A1C goal:  at goal Comments: The patient's true glucose control is difficult to ascertain.  His last A1C of 6.0 was likely an underestimation, given recent GI bleed and other medical comorbidities (cirrhosis).  His glucometer shows elevated blood sugars, though these are a variable length of time from his last meal.  Plan: Medications:  Continue Lantus 35 units daily.  Asked patient to check fasting CBG's until his next visit.  Adjust insulin accordingly based on these values. Home glucose monitoring: Frequency: once a day Timing: after dinner Instruction/counseling given: reminded to bring blood glucose meter & log to each visit Educational resources provided:   Self management tools provided:   Other plans: Recheck glucometer in 2 months

## 2013-06-15 NOTE — Progress Notes (Signed)
HPI The patient is a 72 y.o. 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  The patient has a history of DM.  He was seen 1 month ago, and his insulin regimen was standardized at Lantus 35 units daily (previously, the patient was adjusting his lantus does "free-hand").  He brings his glucometer today, which shows a range from 123-268, with an average of 191.  His last A1C was 6.0, though this was taken 2 months after a hospitalization for GI bleed, and also in the context of cirrhosis.  The patient has a history of HTN.  BP is well-controlled today.  ROS: General: no fevers, chills, changes in weight, changes in appetite Skin: no rash HEENT: no blurry vision, hearing changes, sore throat Pulm: no dyspnea, coughing, wheezing CV: no chest pain, palpitations, shortness of breath Abd: no abdominal pain, nausea/vomiting, diarrhea/constipation GU: no dysuria, hematuria, polyuria Ext: no arthralgias, myalgias Neuro: no weakness, numbness, or tingling  Filed Vitals:   06/15/13 1047  BP: 103/61  Pulse: 63  Temp: 97.2 F (36.2 C)    PEX General: alert, cooperative, and in no apparent distress HEENT: pupils equal round and reactive to light, vision grossly intact, oropharynx clear and non-erythematous  Neck: supple, no lymphadenopathy Lungs: clear to ascultation bilaterally, normal work of respiration, no wheezes, rales, ronchi Heart: regular rate and rhythm, no murmurs, gallops, or rubs Abdomen: soft, non-tender, non-distended, normal bowel sounds Extremities: no cyanosis, clubbing, or edema.  Neurologic: alert & oriented X3, cranial nerves II-XII intact, strength grossly intact, sensation intact to light touch  Current Outpatient Prescriptions on File Prior to Visit  Medication Sig Dispense Refill  . ACCU-CHEK FASTCLIX LANCETS MISC 1 each by Does not apply route 2 (two) times daily.  102 each  6  . B-D INS SYRINGE  0.5CC/31GX5/16 31G X 5/16" 0.5 ML MISC use as directed  100 each  11  . ferrous sulfate 325 (65 FE) MG tablet Take 325 mg by mouth 2 (two) times daily with a meal.      . glucose blood (ACCU-CHEK SMARTVIEW) test strip Use as instructed  100 each  12  . insulin glargine (LANTUS) 100 UNIT/ML injection Inject 35 Units into the skin at bedtime. Inject 35 units into the skin subcutaneously at night.      Marland Kitchen lisinopril (PRINIVIL,ZESTRIL) 10 MG tablet Take 1 tablet (10 mg total) by mouth daily.  30 tablet  11  . Multiple Vitamin (MULTIVITAMIN) tablet Take 1 tablet by mouth daily.  30 tablet  3  . nadolol (CORGARD) 80 MG tablet Take 1 tablet (80 mg total) by mouth daily.  30 tablet  11  . pantoprazole (PROTONIX) 40 MG tablet Take 1 tablet (40 mg total) by mouth 2 (two) times daily before a meal.  180 tablet  3   No current facility-administered medications on file prior to visit.    Assessment/Plan

## 2013-06-15 NOTE — Progress Notes (Signed)
I discussed patient with resident Dr. Brown, and I agree with the plans as outlined in his note. 

## 2013-08-09 ENCOUNTER — Emergency Department (HOSPITAL_COMMUNITY)
Admission: EM | Admit: 2013-08-09 | Discharge: 2013-08-09 | Disposition: A | Discharge status: Home / Self Care | Payer: Medicare Other | Attending: Emergency Medicine | Admitting: Emergency Medicine

## 2013-08-09 ENCOUNTER — Encounter (HOSPITAL_COMMUNITY): Payer: Self-pay | Admitting: Emergency Medicine

## 2013-08-09 DIAGNOSIS — Z8529 Personal history of malignant neoplasm of other respiratory and intrathoracic organs: Secondary | ICD-10-CM | POA: Insufficient documentation

## 2013-08-09 DIAGNOSIS — Z8669 Personal history of other diseases of the nervous system and sense organs: Secondary | ICD-10-CM | POA: Insufficient documentation

## 2013-08-09 DIAGNOSIS — I10 Essential (primary) hypertension: Secondary | ICD-10-CM | POA: Insufficient documentation

## 2013-08-09 DIAGNOSIS — K3184 Gastroparesis: Secondary | ICD-10-CM | POA: Insufficient documentation

## 2013-08-09 DIAGNOSIS — Z8719 Personal history of other diseases of the digestive system: Secondary | ICD-10-CM | POA: Insufficient documentation

## 2013-08-09 DIAGNOSIS — Z8619 Personal history of other infectious and parasitic diseases: Secondary | ICD-10-CM | POA: Insufficient documentation

## 2013-08-09 DIAGNOSIS — D649 Anemia, unspecified: Secondary | ICD-10-CM | POA: Insufficient documentation

## 2013-08-09 DIAGNOSIS — Z860100 Personal history of colon polyps, unspecified: Secondary | ICD-10-CM | POA: Insufficient documentation

## 2013-08-09 DIAGNOSIS — Z923 Personal history of irradiation: Secondary | ICD-10-CM | POA: Insufficient documentation

## 2013-08-09 DIAGNOSIS — K219 Gastro-esophageal reflux disease without esophagitis: Secondary | ICD-10-CM | POA: Insufficient documentation

## 2013-08-09 DIAGNOSIS — K4090 Unilateral inguinal hernia, without obstruction or gangrene, not specified as recurrent: Secondary | ICD-10-CM | POA: Insufficient documentation

## 2013-08-09 DIAGNOSIS — Z85038 Personal history of other malignant neoplasm of large intestine: Secondary | ICD-10-CM | POA: Insufficient documentation

## 2013-08-09 DIAGNOSIS — E1149 Type 2 diabetes mellitus with other diabetic neurological complication: Secondary | ICD-10-CM | POA: Insufficient documentation

## 2013-08-09 DIAGNOSIS — C7982 Secondary malignant neoplasm of genital organs: Secondary | ICD-10-CM | POA: Insufficient documentation

## 2013-08-09 DIAGNOSIS — Z79899 Other long term (current) drug therapy: Secondary | ICD-10-CM | POA: Insufficient documentation

## 2013-08-09 DIAGNOSIS — D61818 Other pancytopenia: Secondary | ICD-10-CM | POA: Insufficient documentation

## 2013-08-09 DIAGNOSIS — Z794 Long term (current) use of insulin: Secondary | ICD-10-CM | POA: Insufficient documentation

## 2013-08-09 MED ORDER — TRAMADOL HCL 50 MG PO TABS: 50.0000 mg | ORAL_TABLET | Freq: Four times a day (QID) | ORAL | Status: DC | PRN

## 2013-08-09 MED ORDER — TRAMADOL HCL 50 MG PO TABS
50.0000 mg | ORAL_TABLET | Freq: Once | ORAL | Status: AC
  Administered 2013-08-09: 50 mg via ORAL
  Filled 2013-08-09: qty 1

## 2013-08-09 NOTE — ED Notes (Signed)
Patient presents to ED with complaints of abdominal pain. No N/V/D.Eddie KitchenMarland [name]

## 2013-08-09 NOTE — ED Provider Notes (Signed)
CSN: [id number]     Arrival date & time 08/09/13  1719 History     First MD Initiated Contact with Patient 08/09/13 1726     Chief Complaint  Patient presents with  . Abdominal Pain   (Consider location/radiation/quality/duration/timing/severity/associated sxs/prior Treatment) The history is provided by the patient.  Eddie [name] is a 72 y.o. male hx of colon cancer and tonsillar cancer and prostate cancer in remission here with abdominal pain. Today he noticed a bump in the right inguinal area when he coughs. Otherwise he does not have pain when he does not cough. Denies any nausea or vomiting and he is passing gas. Denies any urinary symptoms. He has hernia repair in the past. Denies testicular pain.    Past Medical History  Diagnosis Date  . Cancer     Status post curative radiotherapy with IMRT and androgen ablation for the prostate adenocarcinoma, completed December 10, 2008.  Marland Kitchen Cancer of colon     Status post left hemicolectomy at [facility] in November of 2010 revealing T3 N0 M0 colon adenocarcinoma and the patient did not receive adjuvant chemotherapy  . 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  Marland Kitchen Upper GI bleeding     EGD on 01/2011 shows esophagitis, grade 1 varices  . Hypertension   . Cirrhosis   . Anemia     bone marrow biopsy 11/2011,   . Diabetes mellitus   . Hepatitis C   . Gallstone   . Thrush, oral   . GERD (gastroesophageal reflux disease)   . DM gastroparesis   . Gastritis   . Hernia   . Pancreatitis   . Pancytopenia   . Cataract   . Gastric varices   . Colon polyp   . Diverticulosis    Past Surgical History  Procedure Laterality Date  . Colon surgery    . Hernia repair    . Esophagogastroduodenoscopy  02/15/2012    Procedure: ESOPHAGOGASTRODUODENOSCOPY (EGD);  Surgeon: Eliezer Bottom., MD,FACG;  Location: [facility]  ENDOSCOPY;  Service: Endoscopy;  Laterality: N/A;  . Esophagogastroduodenoscopy N/A 03/22/2013    Procedure: ESOPHAGOGASTRODUODENOSCOPY (EGD);  Surgeon: Beverley Fiedler, MD;  Location: [facility] ENDOSCOPY;  Service: Gastroenterology;  Laterality: N/A;   Family History  Problem Relation Age of Onset  . Breast cancer Mother     mother still living and father passed with unsure reason.  . Alzheimer's disease Mother   . Colon cancer Neg Hx   . Esophageal cancer Neg Hx   . Stomach cancer Neg Hx   . Rectal cancer Neg Hx    History  Substance Use Topics  . Smoking status: Never Smoker   . Smokeless tobacco: Never Used  . Alcohol Use: No     Comment: patient used to drink 1.5 bottle of wine plus some liquor daily for 50 + years and quit drinking 4 years ago    Review of Systems  Gastrointestinal: Positive for abdominal pain.  All other systems reviewed and are negative.    Allergies  Review of patient's allergies indicates no known allergies.  Home Medications   Current Outpatient Rx  Name  Route  Sig  Dispense  Refill  . ferrous sulfate 325 (65 FE) MG tablet   Oral   Take 325 mg by mouth 2 (two) times daily with a meal.         .  insulin glargine (LANTUS) 100 UNIT/ML injection   Subcutaneous   Inject 43 Units into the skin at bedtime. Inject 35 units into the skin subcutaneously at night.         Marland Kitchen lisinopril (PRINIVIL,ZESTRIL) 10 MG tablet   Oral   Take 10 mg by mouth daily.         . Multiple Vitamins-Minerals (MULTIVITAMIN PO)   Oral   Take 1 tablet by mouth daily.         . pantoprazole (PROTONIX) 40 MG tablet   Oral   Take 40 mg by mouth 2 (two) times daily.          BP 158/79  Pulse 62  Temp(Src) 98 F (36.7 C) (Oral)  Resp 16  SpO2 100% Physical Exam  Nursing note and vitals reviewed. Constitutional: He is oriented to person, place, and time. He appears well-developed and well-nourished.  Comfortable   HENT:  Head: Normocephalic.  Mouth/Throat:  Oropharynx is clear and moist.  Eyes: Conjunctivae are normal. Pupils are equal, round, and reactive to light.  Neck: Normal range of motion. Neck supple.  Cardiovascular: Normal rate, regular rhythm and normal heart sounds.   Pulmonary/Chest: Effort normal and breath sounds normal. No respiratory distress. He has no wheezes. He has no rales.  Abdominal: Soft. Bowel sounds are normal.  + small R inguinal hernia that is reducible. Not incarcerated. Also a small periumbilical hernia that is reducible.   Genitourinary:  Bilateral testicles nontender   Musculoskeletal: Normal range of motion. He exhibits no edema and no tenderness.  Neurological: He is alert and oriented to person, place, and time.  Skin: Skin is warm and dry.  Psychiatric: He has a normal mood and affect. His behavior is normal. Judgment and thought content normal.    ED Course   Procedures (including critical care time)  Labs Reviewed - No data to display No results found. No diagnosis found.  MDM  Eddie [name] is a 72 y.o. male here with R inguinal hernia that is reducible. He gets yearly CTs and as of November 2013, his cancer is in remission. No signs of obstruction or incarcerated hernia. Will d/c home on short course of tramadol. I called Dr. Conley Rolls from outpatient teaching service who will try and arrange for follow up. Will have him see in surgery clinic. Return precautions given.    Eddie Canal, MD 08/09/13 [phone]

## 2013-08-09 NOTE — ED Notes (Signed)
MD at bedside discussing follow up care.

## 2013-08-11 ENCOUNTER — Ambulatory Visit (INDEPENDENT_AMBULATORY_CARE_PROVIDER_SITE_OTHER): Payer: Medicaid Other | Admitting: General Surgery

## 2013-08-11 ENCOUNTER — Encounter (INDEPENDENT_AMBULATORY_CARE_PROVIDER_SITE_OTHER): Payer: Self-pay | Admitting: General Surgery

## 2013-08-11 VITALS — BP 138/80 | HR 60 | Temp 97.2°F | Resp 14 | Ht 72.0 in | Wt 195.6 lb

## 2013-08-11 DIAGNOSIS — K4090 Unilateral inguinal hernia, without obstruction or gangrene, not specified as recurrent: Secondary | ICD-10-CM | POA: Insufficient documentation

## 2013-08-11 NOTE — Progress Notes (Signed)
Subjective:     Patient ID: Eddie [name], male   DOB: [birth year], 72 y.o.   MRN: [id number]  HPI The patient is a 72 year old male who is referred for a [place] EE secondary to a right inguinal hernia with pain. He states for approximately a week. He states there is no bulge and does not interfere and with his daily activities.  Review of Systems  HENT: Negative.   Respiratory: Negative.   Cardiovascular: Negative.   Gastrointestinal: Negative.   Neurological: Negative.   All other systems reviewed and are negative.       Objective:   Physical Exam  Constitutional: He is oriented to person, place, and time. He appears well-developed and well-nourished.  HENT:  Head: Normocephalic and atraumatic.  Eyes: Conjunctivae and EOM are normal. Pupils are equal, round, and reactive to light.  Neck: Normal range of motion. Neck supple.  Cardiovascular: Normal rate, regular rhythm and normal heart sounds.   Pulmonary/Chest: Effort normal and breath sounds normal.  Abdominal: Soft. Bowel sounds are normal. A hernia is present. Hernia confirmed positive in the ventral area and confirmed positive in the right inguinal area.    Musculoskeletal: Normal range of motion.  Neurological: He is alert and oriented to person, place, and time.  Skin: Skin is warm and dry.       Assessment:     The patient is a 72 year old male with a reducible right inguinal hernia as well as reducible ventral hernia.     Plan:     1. I believe secondary the patient's legs and symptoms at this time and his multiple medical issues I believe watchful waiting is appropriate in this setting.  2. We'll prescribe a truss and returned to the DME store for proper fitting 3. Should the patient have further symptoms to return for evaluation of surgery.

## 2013-08-12 ENCOUNTER — Ambulatory Visit (INDEPENDENT_AMBULATORY_CARE_PROVIDER_SITE_OTHER): Payer: Self-pay | Admitting: General Surgery

## 2013-08-18 ENCOUNTER — Encounter: Payer: Self-pay | Admitting: Internal Medicine

## 2013-08-18 ENCOUNTER — Ambulatory Visit (INDEPENDENT_AMBULATORY_CARE_PROVIDER_SITE_OTHER): Payer: Medicaid Other | Admitting: Internal Medicine

## 2013-08-18 VITALS — BP 128/85 | HR 75 | Temp 97.0°F | Ht 72.0 in | Wt 196.0 lb

## 2013-08-18 DIAGNOSIS — E119 Type 2 diabetes mellitus without complications: Secondary | ICD-10-CM

## 2013-08-18 DIAGNOSIS — N058 Unspecified nephritic syndrome with other morphologic changes: Secondary | ICD-10-CM

## 2013-08-18 DIAGNOSIS — D500 Iron deficiency anemia secondary to blood loss (chronic): Secondary | ICD-10-CM

## 2013-08-18 DIAGNOSIS — E1129 Type 2 diabetes mellitus with other diabetic kidney complication: Secondary | ICD-10-CM

## 2013-08-18 DIAGNOSIS — K4090 Unilateral inguinal hernia, without obstruction or gangrene, not specified as recurrent: Secondary | ICD-10-CM

## 2013-08-18 DIAGNOSIS — E1121 Type 2 diabetes mellitus with diabetic nephropathy: Secondary | ICD-10-CM

## 2013-08-18 LAB — GLUCOSE, CAPILLARY: Glucose-Capillary: 150 mg/dL — ABNORMAL HIGH (ref 70–99)

## 2013-08-18 LAB — POCT GLYCOSYLATED HEMOGLOBIN (HGB A1C): Hemoglobin A1C: 5.9

## 2013-08-18 MED ORDER — ACCU-CHEK NANO SMARTVIEW W/DEVICE KIT: 1.0000 | PACK | Freq: Two times a day (BID) | Status: AC

## 2013-08-18 NOTE — Assessment & Plan Note (Signed)
Patient compliant with the abdominal Truss. No complication. Follow up with surgery

## 2013-08-18 NOTE — Patient Instructions (Signed)
General Instructions: Please bring your meter on next visit Please continue with your insulin at 35 units daily Please come back to see Dr Manson Passey in 2-3 months   Treatment Goals:  Goals (1 Years of Data) as of 08/18/13         As of Today 08/11/13 08/09/13 08/09/13 08/09/13     Blood Pressure    . Blood Pressure < 140/90  128/85 138/80 136/74 158/79 169/86     Diet    . Decrease  juice intake          . Reduce portion size             Note created  11/14/2012  2:10 PM by Cecil Cranker Plyler, RD   Of sherbet            Result Component    . HEMOGLOBIN A1C < 7.0  5.9        . LDL CALC < 100            Progress Toward Treatment Goals:  Treatment Goal 08/18/2013  Hemoglobin A1C at goal  Blood pressure at goal    Self Care Goals & Plans:  Self Care Goal 08/18/2013  Manage my medications take my medicines as prescribed; bring my medications to every visit; refill my medications on time; follow the sick day instructions if I am sick  Monitor my health keep track of my blood glucose; bring my glucose meter and log to each visit; keep track of my weight; check my feet daily  Eat healthy foods eat more vegetables; eat fruit for snacks and desserts; eat baked foods instead of fried foods; eat smaller portions; drink diet soda or water instead of juice or soda  Be physically active take a walk every day; find an activity I enjoy    Home Blood Glucose Monitoring 08/18/2013  Check my blood sugar 3 times a day  When to check my blood sugar before breakfast; before lunch; before dinner     Care Management & Community Referrals:  Referral 06/15/2013  Referrals made for care management support none needed

## 2013-08-18 NOTE — Progress Notes (Signed)
Patient ID: Eddie [name], male   DOB: 21-Jun-1941, 72 y.o.   MRN: [id number]   Subjective:   HPI: Eddie [name] is a 72 y.o. gentleman with past medical history of liver cirrhosis, secondary to hepatitis C, diabetes, hypertension, history of chemotherapy for prostate cancer presents to the clinic for medication refill and followup for right inguinal hernia.  [name] was evaluated in the ED on 08/09/2013 for a right inguinal reducible hernia without obstruction. He was discharged from the ED with a followup appointment with surgeon as outpatient. On 08/11/2013 patient was evaluated by surgery (Dr. Derrell Lolling), who recommended a truss with watchful waiting, given the patient's comorbidities. He reports that he is compliant with the truss and he does not have symptoms of vomiting, constipation, or increased abdominal pain.  Patient's other complaint is black stools, which has ordered over the past one week. Patient denies overt rectal bleeding or bloody stools. Patient takes iron sulfate tablet. No abdominal pain, hematemesis, urinary symptoms or diarrhea. No history of shortness of breath, increased fatigue, headache, dizziness, fevers, loss of appetite.    Past Medical History  Diagnosis Date  . Cancer     Status post curative radiotherapy with IMRT and androgen ablation for the prostate adenocarcinoma, completed December 10, 2008.  Marland Kitchen Cancer of colon     Status post left hemicolectomy at [facility] in November of 2010 revealing T3 N0 M0 colon adenocarcinoma and the patient did not receive adjuvant chemotherapy  . 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  Marland Kitchen Upper GI bleeding     EGD on 01/2011 shows esophagitis, grade 1 varices  . Hypertension   . Cirrhosis   . Anemia     bone marrow biopsy 11/2011,   . Diabetes mellitus   . Hepatitis C   . Gallstone   .  Thrush, oral   . GERD (gastroesophageal reflux disease)   . DM gastroparesis   . Gastritis   . Hernia   . Pancreatitis   . Pancytopenia   . Cataract   . Gastric varices   . Colon polyp   . Diverticulosis    Current Outpatient Prescriptions  Medication Sig Dispense Refill  . Blood Glucose Monitoring Suppl (ACCU-CHEK NANO SMARTVIEW) W/DEVICE KIT 1 each by Does not apply route 2 (two) times daily.  1 kit  0  . ferrous sulfate 325 (65 FE) MG tablet Take 325 mg by mouth 2 (two) times daily with a meal.      . insulin glargine (LANTUS) 100 UNIT/ML injection Inject 43 Units into the skin at bedtime. Inject 35 units into the skin subcutaneously at night.      Marland Kitchen lisinopril (PRINIVIL,ZESTRIL) 10 MG tablet Take 10 mg by mouth daily.      . Multiple Vitamins-Minerals (MULTIVITAMIN PO) Take 1 tablet by mouth daily.      . nadolol (CORGARD) 80 MG tablet       . pantoprazole (PROTONIX) 40 MG tablet Take 40 mg by mouth 2 (two) times daily.       No current facility-administered medications for this visit.   Family History  Problem Relation Age of Onset  . Breast cancer Mother     mother still living and father passed with unsure reason.  . Alzheimer's disease Mother   . Colon cancer Neg Hx   . Esophageal cancer Neg Hx   . Stomach  cancer Neg Hx   . Rectal cancer Neg Hx    History   Social History  . Marital Status: Single    Spouse Name: N/A    Number of Children: 0  . Years of Education: college   Occupational History  . Music teacher     retired   Social History Main Topics  . Smoking status: Never Smoker   . Smokeless tobacco: Never Used  . Alcohol Use: No     Comment: patient used to drink 1.5 bottle of wine plus some liquor daily for 50 + years and quit drinking 4 years ago  . Drug Use: No  . Sexual Activity: Not Currently   Other Topics Concern  . None   Social History Narrative   Lives with his mother at home. Patient is single and retired, receive SS check.  Went to  college in [place] - majored in music         Review of Systems: Constitutional: Denies fever, chills, diaphoresis, appetite change and fatigue.  Respiratory: Denies SOB, DOE, cough, chest tightness, and wheezing.  Cardiovascular: No chest pain, palpitations and leg swelling.  Gastrointestinal: No abdominal pain, nausea, vomiting, bloody stools Genitourinary: No dysuria, frequency, hematuria, or flank pain.  Musculoskeletal: No myalgias, back pain, joint swelling, arthralgias    Objective:  Physical Exam: Filed Vitals:   08/18/13 1034  BP: 128/85  Pulse: 75  Temp: 97 F (36.1 C)  TempSrc: Oral  Height: 6' (1.829 m)  Weight: 196 lb (88.905 kg)  SpO2: 99%   General: Chronically ill appearing, No acute distress. HEENT: Mucus membranes are pink and moist. No evidence of pallor.  Lungs: CTA bilaterally. Heart: RRR; no extra sounds or murmurs  Abdomen: Truss in place. Non-distended, normal BS, soft, nontender; no hepatosplenomegaly  Extremities: No pedal edema. No joint swelling or tenderness. Neurologic: Alert and oriented x3. No obvious neurologic deficits.  Assessment & Plan:  I have discussed my assessment and plan  with Dr. Josem Kaufmann as detailed under problem based charting.

## 2013-08-18 NOTE — Assessment & Plan Note (Signed)
Patient black stool, I felt were due to Fe Supplements. His mucus membranes without pallor and without symptoms of Anemia. Will follow up with further evaluation if he develops bloody stools, hematemesis of anemia symptoms.

## 2013-08-18 NOTE — Assessment & Plan Note (Signed)
Lab Results  Component Value Date   HGBA1C 5.9 08/18/2013   HGBA1C 6.0 05/12/2013   HGBA1C 7.4 01/28/2013     Assessment: Diabetes control: good control (HgbA1C at goal) Progress toward A1C goal:  at goal Comments: Pt has not glucometer today  Plan: Medications:  continue current medications Home glucose monitoring: Frequency: 3 times a day Timing: before breakfast;before lunch;before dinner Instruction/counseling given: reminded to bring medications to each visit Educational resources provided: brochure;handout Self management tools provided:   Other plans: reminded him to bring his glucometer on next visit

## 2013-08-19 NOTE — Progress Notes (Signed)
Case discussed with Dr. Kazibwe soon after the resident saw the patient.  We reviewed the resident's history and exam and pertinent patient test results.  I agree with the assessment, diagnosis, and plan of care documented in the resident's note. 

## 2013-09-01 ENCOUNTER — Other Ambulatory Visit: Payer: Self-pay | Admitting: *Deleted

## 2013-09-01 NOTE — Telephone Encounter (Signed)
Pt uses 43 units daily. Please correct directions. Also needs 20 ml a month.

## 2013-09-02 MED ORDER — INSULIN GLARGINE 100 UNIT/ML ~~LOC~~ SOLN: 43.0000 [IU] | Freq: Every day | SUBCUTANEOUS | Status: DC

## 2013-09-02 MED ORDER — INSULIN GLARGINE 100 UNIT/ML ~~LOC~~ SOLN: 35.0000 [IU] | Freq: Every day | SUBCUTANEOUS | Status: DC

## 2013-09-02 MED ORDER — GLUCOSE BLOOD VI STRP: ORAL_STRIP | Status: DC

## 2013-09-02 MED ORDER — ACCU-CHEK SOFT TOUCH LANCETS MISC: Status: DC

## 2013-09-02 NOTE — Addendum Note (Signed)
Addended by: Linward Headland on: 09/02/2013 04:25 PM   Modules accepted: Orders, Medications

## 2013-09-02 NOTE — Telephone Encounter (Signed)
Rx changed and sent. 

## 2013-10-14 ENCOUNTER — Ambulatory Visit (INDEPENDENT_AMBULATORY_CARE_PROVIDER_SITE_OTHER): Payer: Medicare Other | Admitting: Internal Medicine

## 2013-10-14 ENCOUNTER — Encounter: Payer: Self-pay | Admitting: Internal Medicine

## 2013-10-14 VITALS — BP 137/82 | HR 66 | Temp 97.5°F | Ht 72.0 in | Wt 197.7 lb

## 2013-10-14 DIAGNOSIS — E1129 Type 2 diabetes mellitus with other diabetic kidney complication: Secondary | ICD-10-CM

## 2013-10-14 DIAGNOSIS — N058 Unspecified nephritic syndrome with other morphologic changes: Secondary | ICD-10-CM

## 2013-10-14 DIAGNOSIS — H538 Other visual disturbances: Secondary | ICD-10-CM

## 2013-10-14 DIAGNOSIS — Z23 Encounter for immunization: Secondary | ICD-10-CM

## 2013-10-14 DIAGNOSIS — I10 Essential (primary) hypertension: Secondary | ICD-10-CM

## 2013-10-14 DIAGNOSIS — E1121 Type 2 diabetes mellitus with diabetic nephropathy: Secondary | ICD-10-CM

## 2013-10-14 LAB — GLUCOSE, CAPILLARY: Glucose-Capillary: 239 mg/dL — ABNORMAL HIGH (ref 70–99)

## 2013-10-14 MED ORDER — INSULIN GLARGINE 100 UNIT/ML ~~LOC~~ SOLN: 40.0000 [IU] | Freq: Every day | SUBCUTANEOUS | Status: AC

## 2013-10-14 NOTE — Assessment & Plan Note (Signed)
BP Readings from Last 3 Encounters:  10/14/13 137/82  08/18/13 128/85  08/11/13 138/80    Lab Results  Component Value Date   NA 137 03/22/2013   K 3.8 03/22/2013   CREATININE 1.19 03/22/2013    Assessment: Blood pressure control: controlled Progress toward BP goal:  at goal Comments: BP well-controlled  Plan: Medications:  continue current medications Educational resources provided: brochure Self management tools provided: home blood pressure logbook Other plans: Recheck at next visit

## 2013-10-14 NOTE — Assessment & Plan Note (Signed)
The patient notes recent sudden onset of blurry vision in his left eye. The cause of this is unclear. Considerations include temporal arteritis (though no pain) versus CVA (though no other focal neuro deficits). -Will check ESR today -Will obtain notes from Dr. Mitzi Davenport

## 2013-10-14 NOTE — Assessment & Plan Note (Signed)
Lab Results  Component Value Date   HGBA1C 5.9 08/18/2013   HGBA1C 6.0 05/12/2013   HGBA1C 7.4 01/28/2013     Assessment: Diabetes control: good control (HgbA1C at goal) Progress toward A1C goal:  at goal Comments: The patient's A1C represents good control (5.9).  His relatively high 30 day average glucometer readings likely represent the fact that he checks his blood sugar before bed (after dinner)  Plan: Medications:  Continue Lantus 40 daily Home glucose monitoring: Frequency: once a day Timing: at bedtime Instruction/counseling given: reminded to bring blood glucose meter & log to each visit Educational resources provided: brochure Self management tools provided: copy of home glucose meter download Other plans: flu shot given today

## 2013-10-14 NOTE — Progress Notes (Signed)
HPI The patient is a 72 y.o. male with a history of diabetes, cirrhosis, HCV, chronic pancreatitis, hypertension, presenting for a routine followup visit.  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  He presented to his eye doctor, Dr. Mitzi Davenport, who he reports started him on aspirin 81 mg.  the patient notes no face or eye pain, extremity weakness/numbness/tingling, facial droop, slurred speech, or any other associated symptoms. Vision has not improved since that time.  The patient has a history of DM.  Glucometer report shows a 30-day average of 195.  The patient checks his blood sugar right before bed at night.  He notes no episodes of symptomatic hypoglycemia or hyperglycemia.  He is taking Lantus 40 units daily.  Blood pressure is well controlled today  ROS: General: no fevers, chills, changes in weight, changes in appetite Skin: no rash HEENT: no hearing changes, sore throat Pulm: no dyspnea, coughing, wheezing CV: no chest pain, palpitations, shortness of breath Abd: no abdominal pain, nausea/vomiting, diarrhea/constipation GU: no dysuria, hematuria, polyuria Ext: no arthralgias, myalgias Neuro: no weakness, numbness, or tingling  Filed Vitals:   10/14/13 1320  BP: 137/82  Pulse: 66  Temp: 97.5 F (36.4 C)    PEX General: alert, cooperative, and in no apparent distress HEENT: PERRL, EOMI, left eye unable to read snellen chart, R eye 20/70, no facial ttp, oropharynx clear and non-erythematous  Neck: supple Lungs: clear to ascultation bilaterally, normal work of respiration, no wheezes, rales, ronchi Heart: regular rate and rhythm, no murmurs, gallops, or rubs Abdomen: soft, non-tender, non-distended, normal bowel sounds Extremities: no cyanosis, clubbing, or edema Neurologic: alert & oriented X3, cranial nerves II-XII intact, strength grossly intact, sensation intact to light touch  Current Outpatient Prescriptions on File  Prior to Visit  Medication Sig Dispense Refill  . Blood Glucose Monitoring Suppl (ACCU-CHEK NANO SMARTVIEW) W/DEVICE KIT 1 each by Does not apply route 2 (two) times daily.  1 kit  0  . ferrous sulfate 325 (65 FE) MG tablet Take 325 mg by mouth 2 (two) times daily with a meal.      . glucose blood test strip Use as instructed  100 each  12  . insulin glargine (LANTUS) 100 UNIT/ML injection Inject 0.35 mLs (35 Units total) into the skin at bedtime.  20 mL  11  . Lancets (ACCU-CHEK SOFT TOUCH) lancets Use as instructed  100 each  12  . lisinopril (PRINIVIL,ZESTRIL) 10 MG tablet Take 10 mg by mouth daily.      . Multiple Vitamins-Minerals (MULTIVITAMIN PO) Take 1 tablet by mouth daily.      . nadolol (CORGARD) 80 MG tablet       . pantoprazole (PROTONIX) 40 MG tablet Take 40 mg by mouth 2 (two) times daily.       No current facility-administered medications on file prior to visit.    Assessment/Plan

## 2013-10-14 NOTE — Patient Instructions (Signed)
General Instructions: For your diabetes, continue taking Lantus, 40 units daily.  I'm not sure what your vision loss came from.  We will obtain the notes from Dr. Mitzi Davenport, and may need to order further tests.  For now, we are checking a lab test for one specific condition that can cause vision loss.  You received a flu shot today.  You may have some arm soreness for a few days, which is normal.  Please return for a follow-up visit in 3-4 months.  Treatment Goals:  Goals (1 Years of Data) as of 10/14/13         As of Today 08/18/13 08/11/13 08/09/13 08/09/13     Blood Pressure    . Blood Pressure < 140/90  137/82 128/85 138/80 136/74 158/79     Diet    . Decrease  juice intake          . Reduce portion size             Note created  11/14/2012  2:10 PM by Cecil Cranker Plyler, RD   Of sherbet            Result Component    . HEMOGLOBIN A1C < 7.0   5.9       . LDL CALC < 100            Progress Toward Treatment Goals:  Treatment Goal 08/18/2013  Hemoglobin A1C at goal  Blood pressure at goal    Self Care Goals & Plans:  Self Care Goal 10/14/2013  Manage my medications take my medicines as prescribed; bring my medications to every visit; refill my medications on time  Monitor my health keep track of my blood glucose; bring my glucose meter and log to each visit  Eat healthy foods drink diet soda or water instead of juice or soda; eat more vegetables; eat foods that are low in salt; eat baked foods instead of fried foods; eat fruit for snacks and desserts  Be physically active -    Home Blood Glucose Monitoring 08/18/2013  Check my blood sugar 3 times a day  When to check my blood sugar before breakfast; before lunch; before dinner     Care Management & Community Referrals:  Referral 06/15/2013  Referrals made for care management support none needed

## 2013-10-15 LAB — SEDIMENTATION RATE: Sed Rate: 8 mm/hr (ref 0–16)

## 2013-10-15 NOTE — Progress Notes (Signed)
Case discussed with Dr. Manson Passey at the time of the visit.  We reviewed the resident's history and exam and pertinent patient test results.  I agree with the assessment, diagnosis, and plan of care documented in the resident's note.  Agree with checking ESR.  Symptoms are not classic for GCA, but if ESR is elevated, may give more weight to this diagnosis.

## 2013-11-19 ENCOUNTER — Other Ambulatory Visit: Payer: Self-pay | Admitting: *Deleted

## 2013-11-20 MED ORDER — LISINOPRIL 10 MG PO TABS: 10.0000 mg | ORAL_TABLET | Freq: Every day | ORAL | Status: DC

## 2013-11-25 ENCOUNTER — Other Ambulatory Visit: Payer: Medicare Other | Admitting: Lab

## 2013-11-25 ENCOUNTER — Telehealth: Payer: Self-pay | Admitting: *Deleted

## 2013-11-25 ENCOUNTER — Ambulatory Visit (HOSPITAL_COMMUNITY): Payer: Medicare Other

## 2013-11-25 ENCOUNTER — Ambulatory Visit: Payer: Medicare Other | Admitting: Internal Medicine

## 2013-11-25 NOTE — Telephone Encounter (Signed)
No additional note

## 2013-11-26 ENCOUNTER — Other Ambulatory Visit: Payer: Medicare Other | Admitting: Lab

## 2013-11-26 ENCOUNTER — Ambulatory Visit: Payer: Medicare Other | Admitting: Internal Medicine

## 2013-11-27 ENCOUNTER — Other Ambulatory Visit: Payer: Self-pay | Admitting: *Deleted

## 2013-11-27 DIAGNOSIS — C090 Malignant neoplasm of tonsillar fossa: Secondary | ICD-10-CM

## 2013-11-30 ENCOUNTER — Ambulatory Visit: Payer: Medicare Other | Admitting: Internal Medicine

## 2013-11-30 ENCOUNTER — Telehealth: Payer: Self-pay | Admitting: Internal Medicine

## 2013-11-30 NOTE — Telephone Encounter (Signed)
s/w pt re new appts for lb/ct 12/4 and MM 12/8. pt given appt d/t/location and instructions. pt has prep. appts r/s per 11/28 pof.

## 2013-12-02 ENCOUNTER — Other Ambulatory Visit: Payer: Self-pay | Admitting: Medical Oncology

## 2013-12-02 DIAGNOSIS — C090 Malignant neoplasm of tonsillar fossa: Secondary | ICD-10-CM

## 2013-12-03 ENCOUNTER — Encounter (HOSPITAL_COMMUNITY): Payer: Self-pay

## 2013-12-03 ENCOUNTER — Ambulatory Visit (HOSPITAL_COMMUNITY)
Admission: RE | Admit: 2013-12-03 | Discharge: 2013-12-03 | Disposition: A | Discharge status: Home / Self Care | Payer: Medicare Other | Source: Ambulatory Visit | Attending: Internal Medicine | Admitting: Internal Medicine

## 2013-12-03 ENCOUNTER — Telehealth: Payer: Self-pay | Admitting: Medical Oncology

## 2013-12-03 ENCOUNTER — Other Ambulatory Visit (HOSPITAL_BASED_OUTPATIENT_CLINIC_OR_DEPARTMENT_OTHER): Payer: Medicare Other

## 2013-12-03 DIAGNOSIS — C090 Malignant neoplasm of tonsillar fossa: Secondary | ICD-10-CM

## 2013-12-03 DIAGNOSIS — I8510 Secondary esophageal varices without bleeding: Secondary | ICD-10-CM | POA: Diagnosis not present

## 2013-12-03 DIAGNOSIS — I658 Occlusion and stenosis of other precerebral arteries: Secondary | ICD-10-CM | POA: Diagnosis not present

## 2013-12-03 DIAGNOSIS — K228 Other specified diseases of esophagus: Secondary | ICD-10-CM | POA: Insufficient documentation

## 2013-12-03 DIAGNOSIS — K8689 Other specified diseases of pancreas: Secondary | ICD-10-CM | POA: Diagnosis not present

## 2013-12-03 DIAGNOSIS — C099 Malignant neoplasm of tonsil, unspecified: Secondary | ICD-10-CM | POA: Insufficient documentation

## 2013-12-03 DIAGNOSIS — K7460 Unspecified cirrhosis of liver: Secondary | ICD-10-CM | POA: Insufficient documentation

## 2013-12-03 DIAGNOSIS — I6529 Occlusion and stenosis of unspecified carotid artery: Secondary | ICD-10-CM | POA: Diagnosis not present

## 2013-12-03 DIAGNOSIS — I6509 Occlusion and stenosis of unspecified vertebral artery: Secondary | ICD-10-CM | POA: Diagnosis not present

## 2013-12-03 DIAGNOSIS — K2289 Other specified disease of esophagus: Secondary | ICD-10-CM | POA: Insufficient documentation

## 2013-12-03 DIAGNOSIS — C184 Malignant neoplasm of transverse colon: Secondary | ICD-10-CM

## 2013-12-03 DIAGNOSIS — K8020 Calculus of gallbladder without cholecystitis without obstruction: Secondary | ICD-10-CM | POA: Insufficient documentation

## 2013-12-03 DIAGNOSIS — K439 Ventral hernia without obstruction or gangrene: Secondary | ICD-10-CM | POA: Insufficient documentation

## 2013-12-03 DIAGNOSIS — K766 Portal hypertension: Secondary | ICD-10-CM | POA: Diagnosis not present

## 2013-12-03 DIAGNOSIS — C19 Malignant neoplasm of rectosigmoid junction: Secondary | ICD-10-CM | POA: Insufficient documentation

## 2013-12-03 DIAGNOSIS — C61 Malignant neoplasm of prostate: Secondary | ICD-10-CM | POA: Diagnosis not present

## 2013-12-03 DIAGNOSIS — M47812 Spondylosis without myelopathy or radiculopathy, cervical region: Secondary | ICD-10-CM | POA: Diagnosis not present

## 2013-12-03 DIAGNOSIS — R188 Other ascites: Secondary | ICD-10-CM | POA: Insufficient documentation

## 2013-12-03 LAB — CBC WITH DIFFERENTIAL/PLATELET
BASO%: 0.5 % (ref 0.0–2.0)
EOS%: 6.2 % (ref 0.0–7.0)
HCT: 35.7 % — ABNORMAL LOW (ref 38.4–49.9)
LYMPH%: 17.8 % (ref 14.0–49.0)
MCH: 35 pg — ABNORMAL HIGH (ref 27.2–33.4)
MCHC: 33.6 g/dL (ref 32.0–36.0)
MCV: 104.1 fL — ABNORMAL HIGH (ref 79.3–98.0)
MONO#: 0.4 10*3/uL (ref 0.1–0.9)
MONO%: 10.5 % (ref 0.0–14.0)
NEUT%: 65 % (ref 39.0–75.0)
Platelets: 63 10*3/uL — ABNORMAL LOW (ref 140–400)
RBC: 3.43 10*6/uL — ABNORMAL LOW (ref 4.20–5.82)

## 2013-12-03 LAB — COMPREHENSIVE METABOLIC PANEL (CC13)
AST: 68 U/L — ABNORMAL HIGH (ref 5–34)
Albumin: 3.4 g/dL — ABNORMAL LOW (ref 3.5–5.0)
BUN: 10.9 mg/dL (ref 7.0–26.0)
Calcium: 9.8 mg/dL (ref 8.4–10.4)
Chloride: 108 mEq/L (ref 98–109)
Creatinine: 1.4 mg/dL — ABNORMAL HIGH (ref 0.7–1.3)
Glucose: 78 mg/dl (ref 70–140)
Potassium: 4.6 mEq/L (ref 3.5–5.1)
Total Bilirubin: 1.14 mg/dL (ref 0.20–1.20)

## 2013-12-03 MED ORDER — IOHEXOL 300 MG/ML  SOLN
100.0000 mL | Freq: Once | INTRAMUSCULAR | Status: AC | PRN
  Administered 2013-12-03: 100 mL via INTRAVENOUS

## 2013-12-03 NOTE — Telephone Encounter (Signed)
Needs later appt on Monday -DONE

## 2013-12-07 ENCOUNTER — Ambulatory Visit (HOSPITAL_BASED_OUTPATIENT_CLINIC_OR_DEPARTMENT_OTHER): Payer: Medicare Other | Admitting: Internal Medicine

## 2013-12-07 ENCOUNTER — Encounter: Payer: Self-pay | Admitting: Internal Medicine

## 2013-12-07 ENCOUNTER — Telehealth: Payer: Self-pay | Admitting: Internal Medicine

## 2013-12-07 ENCOUNTER — Ambulatory Visit: Payer: Medicare Other | Admitting: Internal Medicine

## 2013-12-07 ENCOUNTER — Encounter (INDEPENDENT_AMBULATORY_CARE_PROVIDER_SITE_OTHER): Payer: Self-pay

## 2013-12-07 VITALS — BP 150/73 | HR 60 | Temp 97.9°F | Resp 18 | Ht 72.0 in | Wt 195.0 lb

## 2013-12-07 DIAGNOSIS — C090 Malignant neoplasm of tonsillar fossa: Secondary | ICD-10-CM

## 2013-12-07 DIAGNOSIS — C61 Malignant neoplasm of prostate: Secondary | ICD-10-CM

## 2013-12-07 DIAGNOSIS — C184 Malignant neoplasm of transverse colon: Secondary | ICD-10-CM

## 2013-12-07 NOTE — Telephone Encounter (Signed)
Gave pt appt for lab and md appt for next yeat, gave pt oral contrast. Pt will see Dr Marina Goodell GI on January 2015

## 2013-12-07 NOTE — Progress Notes (Signed)
[facility] [phone]   [phone]  OFFICE PROGRESS NOTE  [name], MD [address] [place] [address]  DIAGNOSIS:  1. 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 2. 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 3. History of colon adenocarcinoma diagnosed in September of 2010 at [facility]. 4. History of hepatitis and liver cirrhosis. 5. History of gastrointestinal bleed. 6. Pancytopenia. 7. Mesenteric vein thrombosis.  PRIOR THERAPY:  1. Status post concurrent chemoradiation with weekly cisplatin for tonsillar squamous cell carcinoma, last dose was given March 22, 2008.  2. Status post curative radiotherapy with IMRT and androgen ablation for the prostate adenocarcinoma, completed December 10, 2008. 3. Status post left hemicolectomy at [facility] in November of 2010 revealing T3 N0 M0 colon adenocarcinoma and the patient did not receive adjuvant chemotherapy. 4. Status post treatment with Lovenox and Coumadin discontinued secondary to gastrointestinal bleed  CURRENT THERAPY: Observation.  INTERVAL HISTORY: Eddie [name] 72 y.o. male returns to the clinic today for routine six-month followup visit. The patient is feeling fine today with no specific complaints. He denied having any significant weight loss or night sweats. He denied having any chest pain, shortness breath, cough or hemoptysis. He has no difficulty swallowing or hematemesis. He has repeat CT scan of the neck, chest, abdomen and pelvis performed recently and he is here for evaluation and discussion of his scan results.  MEDICAL HISTORY: Past Medical History  Diagnosis Date  . Cancer     Status post curative radiotherapy with IMRT and androgen ablation for the prostate adenocarcinoma, completed December 10, 2008.  Marland Kitchen Cancer of colon     Status post left  hemicolectomy at [facility] in November of 2010 revealing T3 N0 M0 colon adenocarcinoma and the patient did not receive adjuvant chemotherapy  . 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  Marland Kitchen Upper GI bleeding     EGD on 01/2011 shows esophagitis, grade 1 varices  . Hypertension   . Cirrhosis   . Anemia     bone marrow biopsy 11/2011,   . Diabetes mellitus   . Hepatitis C   . Gallstone   . Thrush, oral   . GERD (gastroesophageal reflux disease)   . DM gastroparesis   . Gastritis   . Hernia   . Pancreatitis   . Pancytopenia   . Cataract   . Gastric varices   . Colon polyp   . Diverticulosis     ALLERGIES:  has No Known Allergies.  MEDICATIONS:  Current Outpatient Prescriptions  Medication Sig Dispense Refill  . aspirin EC 81 MG tablet Take 81 mg by mouth daily.      . Blood Glucose Monitoring Suppl (ACCU-CHEK NANO SMARTVIEW) W/DEVICE KIT 1 each by Does not apply route 2 (two) times daily.  1 kit  0  . ferrous sulfate 325 (65 FE) MG tablet Take 325 mg by mouth 2 (two) times daily with a meal.      . glucose blood test strip Use as instructed  100 each  12  . insulin glargine (LANTUS) 100 UNIT/ML injection Inject 0.4 mLs (40 Units total) into the skin at bedtime.  20 mL  11  . Lancets (ACCU-CHEK SOFT TOUCH) lancets Use as instructed  100 each  12  . lisinopril (PRINIVIL,ZESTRIL) 10 MG tablet Take 1 tablet (10 mg total) by mouth daily.  30 tablet  11  . Multiple Vitamins-Minerals (MULTIVITAMIN PO) Take 1 tablet by mouth daily.      . nadolol (CORGARD) 80 MG tablet       . pantoprazole (PROTONIX) 40 MG tablet Take 40 mg by mouth 2 (two) times daily.      . traMADol (ULTRAM) 50 MG tablet Take by mouth every 6 (six) hours as needed.       No current facility-administered medications for this visit.    SURGICAL HISTORY:  Past Surgical History  Procedure  Laterality Date  . Colon surgery    . Hernia repair    . Esophagogastroduodenoscopy  02/15/2012    Procedure: ESOPHAGOGASTRODUODENOSCOPY (EGD);  Surgeon: Eliezer Bottom., MD,FACG;  Location: [facility] ENDOSCOPY;  Service: Endoscopy;  Laterality: N/A;  . Esophagogastroduodenoscopy N/A 03/22/2013    Procedure: ESOPHAGOGASTRODUODENOSCOPY (EGD);  Surgeon: Beverley Fiedler, MD;  Location: [facility] ENDOSCOPY;  Service: Gastroenterology;  Laterality: N/A;    REVIEW OF SYSTEMS:  A comprehensive review of systems was negative.   PHYSICAL EXAMINATION: General appearance: alert, cooperative and no distress Head: Normocephalic, without obvious abnormality, atraumatic Neck: no adenopathy Resp: clear to auscultation bilaterally Cardio: regular rate and rhythm, S1, S2 normal, no murmur, click, rub or gallop GI: soft, non-tender; bowel sounds normal; no masses,  no organomegaly Extremities: extremities normal, atraumatic, no cyanosis or edema  ECOG PERFORMANCE STATUS: 1 - Symptomatic but completely ambulatory  Blood pressure 150/73, pulse 60, temperature 97.9 F (36.6 C), temperature source Oral, resp. rate 18, height 6' (1.829 m), weight 195 lb (88.451 kg), SpO2 100.00%.  LABORATORY DATA: Lab Results  Component Value Date   WBC 3.7* 12/03/2013   HGB 12.0* 12/03/2013   HCT 35.7* 12/03/2013   MCV 104.1* 12/03/2013   PLT 63* 12/03/2013      Chemistry      Component Value Date/Time   NA 138 12/03/2013 1133   NA 137 03/22/2013 0630   NA 140 05/23/2012 0925   K 4.6 12/03/2013 1133   K 3.8 03/22/2013 0630   K 5.1* 05/23/2012 0925   CL 105 03/22/2013 0630   CL 101 11/24/2012 1427   CL 97* 05/23/2012 0925   CO2 23 12/03/2013 1133   CO2 21 03/22/2013 0630   CO2 31 05/23/2012 0925   BUN 10.9 12/03/2013 1133   BUN 29* 03/22/2013 0630   BUN 11 05/23/2012 0925   CREATININE 1.4* 12/03/2013 1133   CREATININE 1.19 03/22/2013 0630   CREATININE 1.3* 05/23/2012 0925      Component Value Date/Time   CALCIUM 9.8 12/03/2013 1133    CALCIUM 8.8 03/22/2013 0630   CALCIUM 9.4 05/23/2012 0925   ALKPHOS 78 12/03/2013 1133   ALKPHOS 50 03/22/2013 0630   ALKPHOS 101* 05/23/2012 0925   AST 68* 12/03/2013 1133   AST 48* 03/22/2013 0630   AST 51* 05/23/2012 0925   ALT 43 12/03/2013 1133   ALT 24 03/22/2013 0630   ALT 40 05/23/2012 0925   BILITOT 1.14 12/03/2013 1133   BILITOT 1.3* 03/22/2013 0630   BILITOT 1.20 05/23/2012 0925       RADIOGRAPHIC STUDIES: Ct Soft Tissue Neck W Contrast  12/03/2013   CLINICAL DATA:  Follow-up tonsillar cancer.  EXAM: CT NECK WITH CONTRAST  TECHNIQUE: Multidetector CT imaging of the neck was performed using the standard protocol following the bolus administration of intravenous contrast.  CONTRAST:   OMNIPAQUE IOHEXOL 300 MG/ML  SOLN  COMPARISON:  CT neck with contrast 11/24/2012 and 12/21/2011.  FINDINGS: Limited imaging of the brain is unremarkable. Atherosclerotic calcifications are present at the dural margin of the right vertebral artery.  Postradiation changes of the oropharynx are stable. No residual or recurrent tumor is evident. The parapharyngeal fat is clear. No significant adenopathy is present. The patient is edentulous.  Atherosclerotic calcifications are present at the carotid bifurcations bilaterally without definite stenosis or change. Degenerative changes of the cervical spine are again most evident at C5-6 and C6-7, also similar to the prior study.  Minimal dependent atelectasis is present at the lung apices.  IMPRESSION: 1. No evidence for residual or recurrent tumor to the neck. 2. Atherosclerosis. 3. Stable spondylosis of the cervical spine.   Electronically Signed   By: Gennette Pac M.D.   On: 12/03/2013 17:36   Ct Chest W Contrast  12/03/2013   CLINICAL DATA:  Head neck carcinoma of the tonsil diagnosed 2009. Prostate carcinoma diagnosed 2009. Colorectal carcinoma diagnosed 2010.  EXAM: CT CHEST, ABDOMEN, AND PELVIS WITH CONTRAST  TECHNIQUE: Multidetector CT imaging of the chest,  abdomen and pelvis was performed following the standard protocol during bolus administration of intravenous contrast.  CONTRAST:  OMNIPAQUE IOHEXOL 300 MG/ML  SOLN  COMPARISON:  CT 11/24/2012  FINDINGS:   CT CHEST FINDINGS  No axillary or supraclavicular lymphadenopathy. No mediastinal hilar lymphadenopathy. No pericardial fluid. There is some thickening of the distal esophagus. (Images 32 through 49).  Lungs are clear.    CT ABDOMEN AND PELVIS FINDINGS  Liver has a fine nodular contour. There is small amount of fluid surrounding the right hepatic lobe extending along the right pericolic gutter which is new from prior. There is no focal hepatic lesion. There is a small gallstone within the fundus of the gallbladder (image 63). The pancreas demonstrates a chronic partially calcified lesion in the tail measuring 42 x 21 mm unchanged from 43 x 20 mm on prior. The spleen, adrenal glands, kidneys are normal.  The stomach is normal. There are paraesophageal varices noted. There is some thickening through the 1st 2nd portion the duodenum. There is no evidence of bowel obstruction. The ascending and transverse colon normal. There is partial left hemicolectomy. Ventral hernia which contains a short segment of small bowel which is nonobstructed in not changed from prior.  Abdominal aorta is normal caliber. No retroperitoneal periportal lymphadenopathy.  No free fluid the pelvis. The prostate gland is small or removed. The bladder is normal. No pelvic lymphadenopathy.    IMPRESSION: 1. No evidence of metastasis in the thorax. 2. Thickened distal esophagus suggests esophagitis. Cannot exclude neoplasm. Concern upper endoscopy for further evaluation. Findings could relate to the paraesophageal varices. 3. No evidence of metastasis or local recurrence within the abdomen or pelvis. 4. Nodule liver with per esophageal varices and small amount ascites consistent cirrhosis and portal hypertension. Ascites is new from  prior. 5.  stable partially calcified lesion in the tail of the pancreas. 6. Stable partial colectomy without evidence of nodularity or obstruction.   Electronically Signed   By: Genevive Bi M.D.   On: 12/03/2013 15:38   ASSESSMENT AND PLAN: This is a very pleasant 72 years old [place] American male with multiple malignancies including history of tonsillar cancer, colon adenocarcinoma as well as prostate cancer. The patient also has a history of hepatitis, liver cirrhosis, gastrointestinal bleed as well as pancytopenia and mesenteric vein thrombosis.  He is doing fine  and he has no evidence for disease progression on his recent scan. I discussed the scan results with the patient. I recommended for him to continue on observation with repeat CT scan of the neck, chest, abdomen and pelvis in one year. For the distal esophageal thickening, I will refer the patient to Dr. Marina [name] for evaluation and consideration of upper endoscopy He was advised to call me immediately if he has any concerning symptoms in the interval. The patient also asked me to and his sister Dr. Joseph [name] in [place], [place] to update her about his condition and I called her on [phone] and updated her about his current condition. All questions were answered. The patient knows to call the clinic with any problems, questions or concerns. We can certainly see the patient much sooner if necessary.

## 2013-12-07 NOTE — Patient Instructions (Signed)
Followup visit in one year with repeat CT scan of the neck, chest, abdomen and pelvis. Referral to Dr. Marina Goodell for evaluation of the esophageal thickening.

## 2013-12-09 ENCOUNTER — Encounter: Payer: Self-pay | Admitting: *Deleted

## 2013-12-09 NOTE — Progress Notes (Signed)
[facility] Clinical Social Work  Clinical Social Work was referred by medical oncology.  Patient requesting information on LifeAlert services.  CSW contacted patient by phone, [name] states he lives alone and has many stairs.  The patient is interested in Life Alert or a similar service in case of an accident or emergency.  CSW provided patient with information on Life Alert, but also encouraged patient to Psychologist, occupational for more information/support.  Patient was appreciative of phone contact and had no other questions or concerns.  Kathrin Penner, MSW, LCSW Clinical Social Worker [facility] [phone]

## 2014-01-08 ENCOUNTER — Ambulatory Visit (INDEPENDENT_AMBULATORY_CARE_PROVIDER_SITE_OTHER): Payer: Medicare Other | Admitting: Internal Medicine

## 2014-01-08 ENCOUNTER — Encounter: Payer: Self-pay | Admitting: Internal Medicine

## 2014-01-08 VITALS — BP 136/80 | HR 66 | Ht 72.0 in | Wt 196.0 lb

## 2014-01-08 DIAGNOSIS — K2100 Gastro-esophageal reflux disease with esophagitis, without bleeding: Secondary | ICD-10-CM

## 2014-01-08 NOTE — Patient Instructions (Signed)
Please double check your prescription at home to verify if you are taking your Pantoprazole twice a day.    Please follow up with Dr. Henrene Pastor in one year

## 2014-01-08 NOTE — Progress Notes (Signed)
HISTORY OF PRESENT ILLNESS:  Eddie [name] is a 72 y.o. 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 He also has a history of multiple cancers including tonsillar cancer status post surgery, prostate cancer status post radiotherapy, and colon cancer status post left hemicolectomy at [facility] November 2010. He last underwent surveillance colonoscopy 12/16/2012. Diminutive polyp and moderate diverticulosis in addition to radiation proctitis noted. Prior hospitalization for hematemesis secondary to erosive esophagitis. He is sent today regarding abnormal CT scan. He recently underwent chest abdomen and pelvic CT for cancer surveillance. He was noted to have a thickened distal esophagus suggesting esophagitis. For this reason, he is sent. The patient takes pantoprazole 40 mg twice daily. He denies nausea, heartburn, or dysphagia. His last upper endoscopy was performed March 2014. In addition to ulcerative esophagitis, he was noted to have small varices. Patient's GI review of systems is negative. No problems with bleeding, ascites, edema, or encephalopathy.  REVIEW OF SYSTEMS:  All non-GI ROS negative upon complete review  Past Medical History  Diagnosis Date  . Cancer     Status post curative radiotherapy with IMRT and androgen ablation for the prostate adenocarcinoma, completed December 10, 2008.  . Cancer of colon     Status post left hemicolectomy at [facility] in November of 2010 revealing T3 N0 M0 colon adenocarcinoma and the patient did not receive adjuvant chemotherapy  . 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  . Upper  GI bleeding     EGD on 01/2011 shows esophagitis, grade 1 varices  . Hypertension   . Cirrhosis   . Anemia     bone marrow biopsy 11/2011,   . Diabetes mellitus   . Hepatitis C   . Gallstone   . Thrush, oral   . GERD (gastroesophageal reflux disease)   . DM gastroparesis   . Gastritis   . Hernia   . Pancreatitis   . Pancytopenia   . Cataract   . Gastric varices   . Colon polyp   . Diverticulosis     Past Surgical History  Procedure Laterality Date  . Colon surgery    . Hernia repair    . Esophagogastroduodenoscopy  02/15/2012    Procedure: ESOPHAGOGASTRODUODENOSCOPY (EGD);  Surgeon: Malcolm T Stark Jr., MD,FACG;  Location: [facility] ENDOSCOPY;  Service: Endoscopy;  Laterality: N/A;  . Esophagogastroduodenoscopy N/A 03/22/2013    Procedure: ESOPHAGOGASTRODUODENOSCOPY (EGD);  Surgeon: Jay M Pyrtle, MD;  Location: [facility] ENDOSCOPY;  Service: Gastroenterology;  Laterality: N/A;    Social History Eddie [name]  reports that he has never smoked. He has never used smokeless tobacco. He reports that he does not drink alcohol or use illicit drugs.  family history includes Alzheimer's disease in his mother; Breast cancer in his mother. There is no history of Colon cancer, Esophageal cancer, Stomach cancer, or Rectal cancer.  No Known Allergies     PHYSICAL EXAMINATION: Vital signs: BP 136/80  Pulse 66  Ht 6' (1.829 m)  Wt 196 lb (88.905 kg)  BMI 26.58 kg/m2  Constitutional: generally well-appearing, no acute distress Psychiatric: alert and oriented x3, cooperative Eyes: extraocular movements intact, anicteric, conjunctiva pink Mouth: oral pharynx moist, no lesions. No thrush Neck:   supple no lymphadenopathy Cardiovascular: heart regular rate and rhythm, no murmur Lungs: clear to auscultation bilaterally Abdomen: soft, nontender, nondistended, no obvious ascites, no peritoneal signs, normal bowel sounds, no organomegaly. Multiple surgical incisions well-healed Extremities: no lower  extremity edema bilaterally Skin: no lesions on visible extremities Neuro: No focal deficits. No asterixis.    ASSESSMENT:  #1. Abnormal CT scan. Thickened distal esophagus most likely secondary to known significant esophagitis. Currently asymptomatic on twice a day PPI. EGD less than one year ago. Would not pursue further at this time in the absence of clinical symptoms #2. Cirrhosis with associated Portal hypertension with small varices and mild portal gastropathy. On nadolol and iron #3. GERD complicated by erosive esophagitis and stricture as previously noted #4. History of colon cancer status post left hemicolectomy #5. History of tonsillar and prostate cancer #6. 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 Multiple additional medical problems   PLAN:  #1. Continue twice a day PPI #2. Continue nadolol #3. Continue iron #4. Routine GI office followup in 1 year #5. Surveillance colonoscopy around December 2013 #6. Surveillance upper endoscopy in 1 year

## 2014-01-14 ENCOUNTER — Encounter: Payer: Self-pay | Admitting: Internal Medicine

## 2014-01-14 ENCOUNTER — Ambulatory Visit: Payer: Medicare Other | Admitting: Internal Medicine

## 2014-02-03 ENCOUNTER — Other Ambulatory Visit: Payer: Self-pay | Admitting: *Deleted

## 2014-02-03 MED ORDER — NADOLOL 80 MG PO TABS: 80.0000 mg | ORAL_TABLET | Freq: Every day | ORAL | Status: AC

## 2014-03-31 ENCOUNTER — Other Ambulatory Visit: Payer: Self-pay | Admitting: *Deleted

## 2014-03-31 MED ORDER — PANTOPRAZOLE SODIUM 40 MG PO TBEC: 40.0000 mg | DELAYED_RELEASE_TABLET | Freq: Two times a day (BID) | ORAL | Status: DC

## 2014-05-21 ENCOUNTER — Other Ambulatory Visit: Payer: Self-pay | Admitting: *Deleted

## 2014-05-25 ENCOUNTER — Encounter: Payer: Self-pay | Admitting: Internal Medicine

## 2014-05-25 ENCOUNTER — Ambulatory Visit: Payer: Medicare Other | Admitting: Internal Medicine

## 2014-05-25 MED ORDER — "INSULIN SYRINGE-NEEDLE U-100 31G X 5/16"" 0.5 ML MISC": Status: DC

## 2014-07-01 ENCOUNTER — Ambulatory Visit (INDEPENDENT_AMBULATORY_CARE_PROVIDER_SITE_OTHER): Payer: Medicare Other | Admitting: Internal Medicine

## 2014-07-01 ENCOUNTER — Encounter: Payer: Self-pay | Admitting: Internal Medicine

## 2014-07-01 VITALS — BP 129/78 | HR 64 | Temp 97.9°F | Ht 72.0 in | Wt 186.1 lb

## 2014-07-01 DIAGNOSIS — E1121 Type 2 diabetes mellitus with diabetic nephropathy: Secondary | ICD-10-CM

## 2014-07-01 DIAGNOSIS — E1129 Type 2 diabetes mellitus with other diabetic kidney complication: Secondary | ICD-10-CM

## 2014-07-01 DIAGNOSIS — IMO0002 Reserved for concepts with insufficient information to code with codable children: Secondary | ICD-10-CM

## 2014-07-01 DIAGNOSIS — E1165 Type 2 diabetes mellitus with hyperglycemia: Principal | ICD-10-CM

## 2014-07-01 DIAGNOSIS — H6120 Impacted cerumen, unspecified ear: Secondary | ICD-10-CM

## 2014-07-01 DIAGNOSIS — H6123 Impacted cerumen, bilateral: Secondary | ICD-10-CM

## 2014-07-01 DIAGNOSIS — N058 Unspecified nephritic syndrome with other morphologic changes: Secondary | ICD-10-CM

## 2014-07-01 LAB — POCT GLYCOSYLATED HEMOGLOBIN (HGB A1C): Hemoglobin A1C: 5.1

## 2014-07-01 LAB — GLUCOSE, CAPILLARY: Glucose-Capillary: 126 mg/dL — ABNORMAL HIGH (ref 70–99)

## 2014-07-01 MED ORDER — ACCU-CHEK SOFT TOUCH LANCETS MISC: Status: DC

## 2014-07-01 MED ORDER — GLUCOSE BLOOD VI STRP: ORAL_STRIP | Status: DC

## 2014-07-01 MED ORDER — ACCU-CHEK FASTCLIX LANCET KIT: PACK | Status: DC

## 2014-07-01 MED ORDER — CARBAMIDE PEROXIDE 6.5 % OT SOLN: 5.0000 [drp] | Freq: Two times a day (BID) | OTIC | Status: DC

## 2014-07-01 NOTE — Assessment & Plan Note (Addendum)
Reviewed glucometer reading, which demonstrated good glucose control.  A1C slightly low today at 5.1, but glucose readings in accordance and lows in 120s.  He lost his lancet pen and needs refills on his diabetes supplies. -Foot exam today normal. -Continue Lantus 40 units at bedtime. -Refilled lancets and test strips. -Prescription for lancet pen. -Lipid panel today.

## 2014-07-01 NOTE — Patient Instructions (Addendum)
Thank you for coming to clinic today Eddie [name].  General instructions: -Your hearing loss is likely due to ear wax that has accumulated in your ears. -To make the wax soft enough to be removed, use 4-5 drops of carbamide peroxide (Debrox) twice a day for 4 days. -Schedule an appointment to come back to clinic so the wax can be removed from your ears. -If your ears become painful, go to urgent care so the wax can be removed. -Stop by the lab to have your blood drawn. -Your glucose readings from your glucometer and your blood work looks good.  Congratulations! -I sent prescriptions for your diabetes supplies to your pharmacy.   Treatment Goals:  Goals (1 Years of Data) as of 07/01/14         As of Today 01/08/14 12/07/13 10/14/13 08/18/13     Blood Pressure    . Blood Pressure < 140/90  129/78 136/80 150/73 137/82 128/85     Diet    . Decrease  juice intake          . Reduce portion size             Note created  11/14/2012  2:10 PM by Chauncey Reading Plyler, RD   Of sherbet            Result Component    . HEMOGLOBIN A1C < 7.0  5.1    5.9    . LDL CALC < 100            Progress Toward Treatment Goals:  Treatment Goal 10/14/2013  Hemoglobin A1C at goal  Blood pressure at goal    Self Care Goals & Plans:  Self Care Goal 10/14/2013  Manage my medications take my medicines as prescribed; bring my medications to every visit; refill my medications on time  Monitor my health keep track of my blood glucose; bring my glucose meter and log to each visit  Eat healthy foods drink diet soda or water instead of juice or soda; eat more vegetables; eat foods that are low in salt; eat baked foods instead of fried foods; eat fruit for snacks and desserts  Be physically active -    Home Blood Glucose Monitoring 10/14/2013  Check my blood sugar once a day  When to check my blood sugar at bedtime     Care Management & Community Referrals:  Referral 10/14/2013  Referrals made for care  management support none needed          Carbamide Peroxide ear solution What is this medicine? CARBAMIDE PEROXIDE (CAR bah mide per OX ide) is used to soften and help remove ear wax. This medicine may be used for other purposes; ask your health care provider or pharmacist if you have questions. COMMON BRAND NAME(S): Auro Ear, Auro Earache Relief, Debrox, Ear Drops, Ear Wax Removal, Ear Wax Remover, Earwax Treatment, Murine, Thera-Ear What should I tell my health care provider before I take this medicine? They need to know if you have any of these conditions: -dizziness -ear discharge -ear pain, irritation or rash -infection -perforated eardrum (hole in eardrum) -an unusual or allergic reaction to carbamide peroxide, glycerin, hydrogen peroxide, other medicines, foods, dyes, or preservatives -pregnant or trying to get pregnant -breast-feeding How should I use this medicine? This medicine is only for use in the outer ear canal. Follow the directions carefully. Wash hands before and after use. The solution may be warmed by holding the bottle in the hand for 1 to 2  minutes. Lie with the affected ear facing upward. Place the proper number of drops into the ear canal. After the drops are instilled, remain lying with the affected ear upward for 5 minutes to help the drops stay in the ear canal. A cotton ball may be gently inserted at the ear opening for no longer than 5 to 10 minutes to ensure retention. Repeat, if necessary, for the opposite ear. Do not touch the tip of the dropper to the ear, fingertips, or other surface. Do not rinse the dropper after use. Keep container tightly closed. Talk to your pediatrician regarding the use of this medicine in children. While this drug may be used in children as young as 12 years for selected conditions, precautions do apply. Overdosage: If you think you have taken too much of this medicine contact a poison control center or emergency room at once. NOTE:  This medicine is only for you. Do not share this medicine with others. What if I miss a dose? If you miss a dose, use it as soon as you can. If it is almost time for your next dose, use only that dose. Do not use double or extra doses. What may interact with this medicine? Interactions are not expected. Do not use any other ear products without asking your doctor or health care professional. This list may not describe all possible interactions. Give your health care provider a list of all the medicines, herbs, non-prescription drugs, or dietary supplements you use. Also tell them if you smoke, drink alcohol, or use illegal drugs. Some items may interact with your medicine. What should I watch for while using this medicine? This medicine is not for long-term use. Do not use for more than 4 days without checking with your health care professional. Contact your doctor or health care professional if your condition does not start to get better within a few days or if you notice burning, redness, itching or swelling. What side effects may I notice from receiving this medicine? Side effects that you should report to your doctor or health care professional as soon as possible: -allergic reactions like skin rash, itching or hives, swelling of the face, lips, or tongue -burning, itching, and redness -worsening ear pain -rash Side effects that usually do not require medical attention (report to your doctor or health care professional if they continue or are bothersome): -abnormal sensation while putting the drops in the ear -temporary reduction in hearing (but not complete loss of hearing) This list may not describe all possible side effects. Call your doctor for medical advice about side effects. You may report side effects to FDA at 1-800-FDA-1088. Where should I keep my medicine? Keep out of the reach of children. Store at room temperature between 15 and 30 degrees C (59 and 86 degrees F) in a tight,  light-resistant container. Keep bottle away from excessive heat and direct sunlight. Throw away any unused medicine after the expiration date. NOTE: This sheet is a summary. It may not cover all possible information. If you have questions about this medicine, talk to your doctor, pharmacist, or health care provider.  2015, [employer]. (2008-03-30 14:00:02)

## 2014-07-01 NOTE — Assessment & Plan Note (Signed)
Increasing hearing loss of last week worse over last two days R>L.  Extensive cerumen deposition in bilateral ears is likely the main cause.  Compacted and likely difficult to remove at this time.  No dizziness or infections symptoms. -Debrox 4-5 drops in bilateral ears for four days to soften wax. -Return to clinic early next week for wax removal by nurse. -Encouraged patient to seek care if he develops pain as the drops may cause the wax to expand.

## 2014-07-01 NOTE — Progress Notes (Signed)
Subjective:    Patient ID: Eddie [name], male    DOB: [birth year], 73 y.o.   MRN: [id number]  HPI Comments: Eddie [name] is a 73 year old man with a history of head and neck cancer, colon cancer, prostate cancer, hepatitis C with cirrhosis, and DM2 presenting with hearing loss.  He reports decreased hearing over the last week that has gotten worse the last two days.  He says that he tried some ear drops that he had at home two days ago and some salt water last night, but they did not help.  He reports ear fullness.  He says he had similar hearing loss 7 years ago that resolved when his doctor cleaned out his ears and gave him some ear drops.  He denies any cold symptoms, fevers, chills, headaches, dizziness, otalgia, shortness of breath, numbess, or weakness.  He brought his glucometer in, and his blood sugars have been well maintained.  Average of 150, high of 206, low of 124.  Diabetes He presents for his follow-up diabetic visit. He has type 2 diabetes mellitus. No MedicAlert identification noted. His disease course has been stable. Pertinent negatives for hypoglycemia include no confusion, dizziness, headaches, hunger, mood changes, nervousness/anxiousness, pallor, seizures, sleepiness, speech difficulty, sweats or tremors. Associated symptoms include blurred vision and visual change. Pertinent negatives for diabetes include no chest pain, no fatigue, no foot paresthesias, no foot ulcerations, no polydipsia, no polyphagia, no polyuria, no weakness and no weight loss. Pertinent negatives for hypoglycemia complications include no blackouts, no hospitalization, no nocturnal hypoglycemia, no required assistance and no required glucagon injection. Symptoms are stable.      Review of Systems  Constitutional: Negative for fever, chills, weight loss, diaphoresis, activity change, appetite change, fatigue and unexpected weight change.  HENT: Positive for hearing loss. Negative for  congestion, ear discharge, ear pain, rhinorrhea, sinus pressure, sneezing, sore throat, tinnitus and trouble swallowing.   Eyes: Positive for blurred vision and visual disturbance (Remote vision loss thought to be related to diabetes.). Negative for pain and itching.  Respiratory: Negative for cough, choking, chest tightness, shortness of breath, wheezing and stridor.   Cardiovascular: Negative for chest pain and leg swelling.  Gastrointestinal: Negative for nausea, vomiting, abdominal pain, diarrhea, constipation and abdominal distention.  Endocrine: Negative for cold intolerance, polydipsia, polyphagia and polyuria.  Genitourinary: Negative for dysuria, hematuria and difficulty urinating.  Musculoskeletal: Negative for arthralgias and back pain.  Skin: Negative for pallor and rash.  Neurological: Negative for dizziness, tremors, seizures, speech difficulty, weakness and headaches.  Psychiatric/Behavioral: Negative for confusion. The patient is not nervous/anxious.        Objective:   Physical Exam  Constitutional: He is oriented to person, place, and time. He appears well-developed and well-nourished. No distress.  HENT:  Head: Normocephalic and atraumatic.  Right Ear: No swelling. Decreased hearing (R greater than L.) is noted.  Left Ear: No swelling. Decreased hearing is noted.  Nose: Nose normal.  Mouth/Throat: Oropharynx is clear and moist.  Large amount of cerumen bilaterally in ear canals.  TM not visible.  Eyes: Conjunctivae and EOM are normal. Pupils are equal, round, and reactive to light. Right eye exhibits no discharge. Left eye exhibits no discharge. No scleral icterus.  Neck: Normal range of motion. Neck supple.  Cardiovascular: Normal rate and regular rhythm.  Exam reveals no gallop and no friction rub.   No murmur heard. Pulmonary/Chest: Effort normal and breath sounds normal. No respiratory distress. He has no wheezes. He  has no rales.  Abdominal: Soft. He exhibits no  distension. There is no tenderness.  Musculoskeletal: Normal range of motion. He exhibits no edema and no tenderness.  Neurological: He is alert and oriented to person, place, and time. No cranial nerve deficit.  Skin: Skin is warm and dry. No rash noted. He is not diaphoretic.  Psychiatric: He has a normal mood and affect.          Assessment & Plan:  Please see problem-based assessment and plan.

## 2014-07-02 LAB — LIPID PANEL
Cholesterol: 141 mg/dL (ref 0–200)
HDL: 53 mg/dL (ref >39)
LDL CALC: 71 mg/dL (ref 0–99)
Total CHOL/HDL Ratio: 2.7 Ratio
Triglycerides: 84 mg/dL (ref <150)
VLDL: 17 mg/dL (ref 0–40)

## 2014-07-07 ENCOUNTER — Encounter: Payer: Self-pay | Admitting: Internal Medicine

## 2014-07-07 ENCOUNTER — Ambulatory Visit (INDEPENDENT_AMBULATORY_CARE_PROVIDER_SITE_OTHER): Payer: Medicare Other | Admitting: Internal Medicine

## 2014-07-07 VITALS — BP 125/72 | HR 66 | Temp 97.0°F | Wt 186.2 lb

## 2014-07-07 DIAGNOSIS — E1165 Type 2 diabetes mellitus with hyperglycemia: Principal | ICD-10-CM

## 2014-07-07 DIAGNOSIS — H6123 Impacted cerumen, bilateral: Secondary | ICD-10-CM

## 2014-07-07 DIAGNOSIS — N058 Unspecified nephritic syndrome with other morphologic changes: Secondary | ICD-10-CM

## 2014-07-07 DIAGNOSIS — E1129 Type 2 diabetes mellitus with other diabetic kidney complication: Secondary | ICD-10-CM

## 2014-07-07 DIAGNOSIS — E1121 Type 2 diabetes mellitus with diabetic nephropathy: Secondary | ICD-10-CM

## 2014-07-07 DIAGNOSIS — H6120 Impacted cerumen, unspecified ear: Secondary | ICD-10-CM

## 2014-07-07 DIAGNOSIS — IMO0002 Reserved for concepts with insufficient information to code with codable children: Secondary | ICD-10-CM

## 2014-07-07 MED ORDER — ZOSTER VACCINE LIVE 19400 UNT/0.65ML ~~LOC~~ SOLR: 0.6500 mL | Freq: Once | SUBCUTANEOUS | Status: AC

## 2014-07-07 NOTE — Assessment & Plan Note (Addendum)
Cerumen removed from bilateral ears and significant improvement in hearing post-procedure.  No further work-up for hearing loss necessary.  There is a piece of hard cerumen in his left ear that could not be removed. -Told patient to monitor his hearing.  If he develops hearing loss again, use Debrox for 4 days and schedule an appointment to see [place]. -Apply mineral oil with cotton ball to external ear canals 10-20 minutes per week to help with cerumen elimination.

## 2014-07-07 NOTE — Progress Notes (Signed)
I saw and evaluated the patient.  I personally confirmed the key portions of the history and exam documented by Dr. Moding and I reviewed pertinent patient test results.  The assessment, diagnosis, and plan were formulated together and I agree with the documentation in the resident's note. 

## 2014-07-07 NOTE — Patient Instructions (Addendum)
Thank you for coming to clinic today Eddie [name].  General instructions: -Let us know if your hearing loss comes back. -If you begin to lose hearing in your left ear, use the Debrox drops for 4 days then come back and see [place]. -Do NOT use the Debrox for more than a week at a time because it can irritate your ears. -To prevent the buildup of ear wax, you can put cotton balls soaked in mineral oil in your external ear canals for 10-20 minutes per week. -I sent a prescription for the shingles vaccine to your pharmacy.  You can go there to receive the vaccine.

## 2014-07-07 NOTE — Progress Notes (Signed)
   Subjective:    Patient ID: Eddie [name], male    DOB: 1941/01/09, 73 y.o.   MRN: [id number]  HPI Comments: Eddie [name] is a 73 year old man with a history of head and neck cancer, colon cancer, prostate cancer, hepatitis C with cirrhosis, and DM2 presenting for removal of cerumen from his ears.  I saw Eddie [name] last week for a week of hearing loss.  On physical exam, he had a significant amount of cerumen impacted in his ears bilaterally.  The cerumen appeared very hard, so I had him apply Debrox drops for the last week to soften it for removal.  Today he says that he has been using the drops and that his hearing has not improved.  He denies any pain or burning from the drops and continues to have ear fullness.  He reports being able to pick up his diabetes testing supplies and currently does not need refills on any of his medications.      Review of Systems  Constitutional: Negative for fever, chills and fatigue.  HENT: Positive for hearing loss. Negative for congestion, ear pain and rhinorrhea.   Eyes: Negative for visual disturbance.  Respiratory: Negative for apnea, cough and shortness of breath.   Cardiovascular: Negative for chest pain and palpitations.  Gastrointestinal: Positive for constipation (Long-standing.). Negative for nausea, vomiting, diarrhea and abdominal distention.  Genitourinary: Negative for dysuria, frequency and hematuria.  Musculoskeletal: Negative for joint swelling and myalgias.  Skin: Negative.   Neurological: Negative.   Psychiatric/Behavioral: Negative.        Objective:   Physical Exam  Constitutional: He is oriented to person, place, and time. He appears well-developed and well-nourished. No distress.  HENT:  Head: Normocephalic and atraumatic.  Nose: Nose normal.  Mouth/Throat: Oropharynx is clear and moist. No oropharyngeal exudate.  Extensive cerumen in bilateral ears, slightly decreased in right ear.  Hearing improved from last  exam but still decreased bilaterally.  Eyes: Conjunctivae and EOM are normal. Pupils are equal, round, and reactive to light. Right eye exhibits no discharge. Left eye exhibits no discharge. No scleral icterus.  Neck: Normal range of motion. Neck supple.  Cardiovascular: Normal rate, regular rhythm and normal heart sounds.  Exam reveals no gallop and no friction rub.   No murmur heard. Pulmonary/Chest: Effort normal. No respiratory distress. He has wheezes (Expiratory.). He has no rales. He exhibits no tenderness.  Abdominal: Soft. Bowel sounds are normal.  Ventral and right inguinal hernias-reducible and non-tender.  Musculoskeletal: Normal range of motion. He exhibits no edema and no tenderness.  Neurological: He is alert and oriented to person, place, and time. No cranial nerve deficit.  Skin: Skin is warm and dry. No rash noted. He is not diaphoretic. No erythema. No pallor.  Psychiatric: He has a normal mood and affect.   Cerumen removal: Ears were flushed with a mixture of water and hydrogen peroxide and excess cerumen was removed using a plastic curette.  After removal, the right ear canal was completely clear and TM was visualized, and most of the wax was removed from the left ear.  A hard piece of wax was still in the left ear and could not be removed.  Following the procedure, the patient reported complete return of his baseline hearing and he had no reduction in hearing by physical exam.     Assessment & Plan:  Please see problem-based assessment and plan.

## 2014-07-07 NOTE — Assessment & Plan Note (Signed)
Patient was able to pick up his test supplies and has resumed checking his home blood sugar four times per day.  A1C 5.1 at last visit. -Continue Lantus 40 unit at bedtime. -Lipids at goal last visit, not on statin.

## 2014-11-04 ENCOUNTER — Other Ambulatory Visit: Payer: Self-pay | Admitting: *Deleted

## 2014-11-04 DIAGNOSIS — IMO0002 Reserved for concepts with insufficient information to code with codable children: Secondary | ICD-10-CM

## 2014-11-04 DIAGNOSIS — E1165 Type 2 diabetes mellitus with hyperglycemia: Principal | ICD-10-CM

## 2014-11-04 DIAGNOSIS — E1121 Type 2 diabetes mellitus with diabetic nephropathy: Secondary | ICD-10-CM

## 2014-11-08 ENCOUNTER — Other Ambulatory Visit: Payer: Self-pay | Admitting: *Deleted

## 2014-11-08 DIAGNOSIS — IMO0002 Reserved for concepts with insufficient information to code with codable children: Secondary | ICD-10-CM

## 2014-11-08 DIAGNOSIS — E1165 Type 2 diabetes mellitus with hyperglycemia: Principal | ICD-10-CM

## 2014-11-08 DIAGNOSIS — E1121 Type 2 diabetes mellitus with diabetic nephropathy: Secondary | ICD-10-CM

## 2014-11-08 NOTE — Telephone Encounter (Signed)
Patient is almost out of strips.

## 2014-11-09 MED ORDER — GLUCOSE BLOOD VI STRP: ORAL_STRIP | Status: AC

## 2014-11-10 MED ORDER — GLUCOSE BLOOD VI STRP: ORAL_STRIP | Status: AC

## 2014-11-10 MED ORDER — ACCU-CHEK FASTCLIX LANCET KIT: PACK | Status: AC

## 2014-12-06 ENCOUNTER — Ambulatory Visit (HOSPITAL_COMMUNITY)
Admission: RE | Admit: 2014-12-06 | Discharge: 2014-12-06 | Disposition: A | Discharge status: Home / Self Care | Payer: Medicare Other | Source: Ambulatory Visit | Attending: Internal Medicine | Admitting: Internal Medicine

## 2014-12-06 ENCOUNTER — Other Ambulatory Visit (HOSPITAL_BASED_OUTPATIENT_CLINIC_OR_DEPARTMENT_OTHER): Payer: Medicare Other

## 2014-12-06 DIAGNOSIS — C184 Malignant neoplasm of transverse colon: Secondary | ICD-10-CM

## 2014-12-06 DIAGNOSIS — Z9221 Personal history of antineoplastic chemotherapy: Secondary | ICD-10-CM | POA: Diagnosis not present

## 2014-12-06 DIAGNOSIS — Z923 Personal history of irradiation: Secondary | ICD-10-CM | POA: Insufficient documentation

## 2014-12-06 DIAGNOSIS — K118 Other diseases of salivary glands: Secondary | ICD-10-CM | POA: Diagnosis not present

## 2014-12-06 DIAGNOSIS — J9811 Atelectasis: Secondary | ICD-10-CM | POA: Insufficient documentation

## 2014-12-06 DIAGNOSIS — G319 Degenerative disease of nervous system, unspecified: Secondary | ICD-10-CM | POA: Insufficient documentation

## 2014-12-06 DIAGNOSIS — J3489 Other specified disorders of nose and nasal sinuses: Secondary | ICD-10-CM | POA: Insufficient documentation

## 2014-12-06 DIAGNOSIS — J90 Pleural effusion, not elsewhere classified: Secondary | ICD-10-CM | POA: Diagnosis not present

## 2014-12-06 DIAGNOSIS — Z8546 Personal history of malignant neoplasm of prostate: Secondary | ICD-10-CM | POA: Diagnosis present

## 2014-12-06 DIAGNOSIS — C61 Malignant neoplasm of prostate: Secondary | ICD-10-CM

## 2014-12-06 DIAGNOSIS — K439 Ventral hernia without obstruction or gangrene: Secondary | ICD-10-CM | POA: Diagnosis not present

## 2014-12-06 DIAGNOSIS — K4090 Unilateral inguinal hernia, without obstruction or gangrene, not specified as recurrent: Secondary | ICD-10-CM | POA: Insufficient documentation

## 2014-12-06 DIAGNOSIS — K7460 Unspecified cirrhosis of liver: Secondary | ICD-10-CM | POA: Insufficient documentation

## 2014-12-06 DIAGNOSIS — Z85819 Personal history of malignant neoplasm of unspecified site of lip, oral cavity, and pharynx: Secondary | ICD-10-CM | POA: Diagnosis present

## 2014-12-06 DIAGNOSIS — I700 Atherosclerosis of aorta: Secondary | ICD-10-CM | POA: Insufficient documentation

## 2014-12-06 DIAGNOSIS — R161 Splenomegaly, not elsewhere classified: Secondary | ICD-10-CM | POA: Insufficient documentation

## 2014-12-06 DIAGNOSIS — N200 Calculus of kidney: Secondary | ICD-10-CM | POA: Diagnosis not present

## 2014-12-06 DIAGNOSIS — R188 Other ascites: Secondary | ICD-10-CM | POA: Diagnosis not present

## 2014-12-06 DIAGNOSIS — K766 Portal hypertension: Secondary | ICD-10-CM | POA: Insufficient documentation

## 2014-12-06 DIAGNOSIS — Z85038 Personal history of other malignant neoplasm of large intestine: Secondary | ICD-10-CM | POA: Insufficient documentation

## 2014-12-06 DIAGNOSIS — C090 Malignant neoplasm of tonsillar fossa: Secondary | ICD-10-CM

## 2014-12-06 DIAGNOSIS — I81 Portal vein thrombosis: Secondary | ICD-10-CM | POA: Insufficient documentation

## 2014-12-06 DIAGNOSIS — K8020 Calculus of gallbladder without cholecystitis without obstruction: Secondary | ICD-10-CM | POA: Diagnosis not present

## 2014-12-06 DIAGNOSIS — K868 Other specified diseases of pancreas: Secondary | ICD-10-CM | POA: Diagnosis not present

## 2014-12-06 LAB — COMPREHENSIVE METABOLIC PANEL (CC13)
ALBUMIN: 3 g/dL — AB (ref 3.5–5.0)
ALT: 36 U/L (ref 0–55)
ANION GAP: 8 meq/L (ref 3–11)
AST: 62 U/L — ABNORMAL HIGH (ref 5–34)
Alkaline Phosphatase: 79 U/L (ref 40–150)
BILIRUBIN TOTAL: 1.47 mg/dL — AB (ref 0.20–1.20)
BUN: 10.6 mg/dL (ref 7.0–26.0)
CO2: 22 mEq/L (ref 22–29)
Calcium: 9.2 mg/dL (ref 8.4–10.4)
Chloride: 107 mEq/L (ref 98–109)
Creatinine: 1.2 mg/dL (ref 0.7–1.3)
EGFR: 70 mL/min/{1.73_m2} — ABNORMAL LOW (ref >90)
Glucose: 99 mg/dl (ref 70–140)
Potassium: 4.8 mEq/L (ref 3.5–5.1)
SODIUM: 138 meq/L (ref 136–145)
TOTAL PROTEIN: 6.7 g/dL (ref 6.4–8.3)

## 2014-12-06 LAB — CBC WITH DIFFERENTIAL/PLATELET
BASO%: 0.3 % (ref 0.0–2.0)
Basophils Absolute: 0 10*3/uL (ref 0.0–0.1)
EOS ABS: 0.2 10*3/uL (ref 0.0–0.5)
EOS%: 6.5 % (ref 0.0–7.0)
HEMATOCRIT: 36.6 % — AB (ref 38.4–49.9)
HEMOGLOBIN: 12.3 g/dL — AB (ref 13.0–17.1)
LYMPH%: 24.5 % (ref 14.0–49.0)
MCH: 35 pg — ABNORMAL HIGH (ref 27.2–33.4)
MCHC: 33.6 g/dL (ref 32.0–36.0)
MCV: 104.3 fL — ABNORMAL HIGH (ref 79.3–98.0)
MONO#: 0.4 10*3/uL (ref 0.1–0.9)
MONO%: 13.4 % (ref 0.0–14.0)
NEUT#: 1.8 10*3/uL (ref 1.5–6.5)
NEUT%: 55.3 % (ref 39.0–75.0)
Platelets: 53 10*3/uL — ABNORMAL LOW (ref 140–400)
RBC: 3.51 10*6/uL — AB (ref 4.20–5.82)
RDW: 15.7 % — ABNORMAL HIGH (ref 11.0–14.6)
WBC: 3.2 10*3/uL — AB (ref 4.0–10.3)
lymph#: 0.8 10*3/uL — ABNORMAL LOW (ref 0.9–3.3)

## 2014-12-06 LAB — LACTATE DEHYDROGENASE (CC13): LDH: 302 U/L — AB (ref 125–245)

## 2014-12-06 MED ORDER — IOHEXOL 300 MG/ML  SOLN
100.0000 mL | Freq: Once | INTRAMUSCULAR | Status: AC | PRN
  Administered 2014-12-06: 100 mL via INTRAVENOUS

## 2014-12-07 ENCOUNTER — Encounter: Payer: Self-pay | Admitting: Internal Medicine

## 2014-12-07 ENCOUNTER — Telehealth: Payer: Self-pay | Admitting: Internal Medicine

## 2014-12-07 ENCOUNTER — Ambulatory Visit (HOSPITAL_BASED_OUTPATIENT_CLINIC_OR_DEPARTMENT_OTHER): Payer: Medicare Other | Admitting: Internal Medicine

## 2014-12-07 VITALS — BP 151/95 | HR 55 | Temp 97.8°F | Resp 18 | Ht 72.0 in | Wt 189.1 lb

## 2014-12-07 DIAGNOSIS — C184 Malignant neoplasm of transverse colon: Secondary | ICD-10-CM

## 2014-12-07 DIAGNOSIS — C61 Malignant neoplasm of prostate: Secondary | ICD-10-CM

## 2014-12-07 DIAGNOSIS — Z8546 Personal history of malignant neoplasm of prostate: Secondary | ICD-10-CM

## 2014-12-07 DIAGNOSIS — R188 Other ascites: Secondary | ICD-10-CM

## 2014-12-07 DIAGNOSIS — Z85038 Personal history of other malignant neoplasm of large intestine: Secondary | ICD-10-CM

## 2014-12-07 DIAGNOSIS — K7460 Unspecified cirrhosis of liver: Secondary | ICD-10-CM

## 2014-12-07 DIAGNOSIS — K759 Inflammatory liver disease, unspecified: Secondary | ICD-10-CM

## 2014-12-07 DIAGNOSIS — Z23 Encounter for immunization: Secondary | ICD-10-CM

## 2014-12-07 LAB — CEA: CEA: 3.2 ng/mL (ref 0.0–5.0)

## 2014-12-07 LAB — PSA: PSA: 0.21 ng/mL (ref <=4.00)

## 2014-12-07 MED ORDER — INFLUENZA VAC SPLIT QUAD 0.5 ML IM SUSY
0.5000 mL | PREFILLED_SYRINGE | Freq: Once | INTRAMUSCULAR | Status: AC
  Administered 2014-12-07: 0.5 mL via INTRAMUSCULAR
  Filled 2014-12-07: qty 0.5

## 2014-12-07 NOTE — Telephone Encounter (Signed)
Pt confirmed labs/ov per 12/08 POF, gave pt AVS.... KJ °

## 2014-12-07 NOTE — Progress Notes (Signed)
[place] [phone]   [phone]  OFFICE PROGRESS NOTE  Arman Filter, MD [place] [address]  DIAGNOSIS:  1. 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 2. 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 3. History of colon adenocarcinoma diagnosed in September of 2010 at [facility]. 4. History of hepatitis and liver cirrhosis. 5. History of gastrointestinal bleed. 6. Pancytopenia. 7. Mesenteric vein thrombosis.  PRIOR THERAPY:  1. Status post concurrent chemoradiation with weekly cisplatin for tonsillar squamous cell carcinoma, last dose was given March 22, 2008.  2. Status post curative radiotherapy with IMRT and androgen ablation for the prostate adenocarcinoma, completed December 10, 2008. 3. Status post left hemicolectomy at [facility] in November of 2010 revealing T3 N0 M0 colon adenocarcinoma and the patient did not receive adjuvant chemotherapy. 4. Status post treatment with Lovenox and Coumadin discontinued secondary to gastrointestinal bleed  CURRENT THERAPY: Observation.  INTERVAL HISTORY: Eddie [name] 73 y.o. male returns to the clinic today for routine annual followup visit. The patient is feeling fine today with no specific complaints. He denied having any significant weight loss or night sweats. He denied having any chest pain, shortness of breath, cough or hemoptysis. He has no nausea, vomiting, abdominal pain, diarrhea or constipation. He has no melena or hematochezia. He complains of some new visual disturbances and he was planning to see his ophthalmologist for this condition. He has repeat CT scan of the neck, chest, abdomen and pelvis performed recently and he is here for evaluation and discussion of his scan results.   MEDICAL HISTORY: Past Medical History  Diagnosis Date  . Cancer     Status post  curative radiotherapy with IMRT and androgen ablation for the prostate adenocarcinoma, completed December 10, 2008.  Marland Kitchen Cancer of colon     Status post left hemicolectomy at [facility] in November of 2010 revealing T3 N0 M0 colon adenocarcinoma and the patient did not receive adjuvant chemotherapy  . 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  Marland Kitchen Upper GI bleeding     EGD on 01/2011 shows esophagitis, grade 1 varices  . Hypertension   . Cirrhosis   . Anemia     bone marrow biopsy 11/2011,   . Diabetes mellitus   . Hepatitis C   . Gallstone   . Thrush, oral   . GERD (gastroesophageal reflux disease)   . DM gastroparesis   . Gastritis   . Hernia   . Pancreatitis   . Pancytopenia   . Cataract   . Gastric varices   . Colon polyp   . Diverticulosis     ALLERGIES:  has No Known Allergies.  MEDICATIONS:  Current Outpatient Prescriptions  Medication Sig Dispense Refill  . aspirin EC 81 MG tablet Take 81 mg by mouth daily.    . Blood Glucose Monitoring Suppl (ACCU-CHEK NANO SMARTVIEW) W/DEVICE KIT 1 each by Does not apply route 2 (two) times daily. 1 kit 0  . ferrous sulfate 325 (65 FE) MG tablet Take 325 mg by mouth 2 (two) times daily with a meal.    . folic acid (FOLVITE) 578 MCG tablet Take 1 tablet by mouth 2 (two) times daily.    Marland Kitchen glucose blood (ACCU-CHEK SMARTVIEW) test strip Check blood  sugar up to 3 times daily 100 each 12  . glucose blood test strip Use to check blood sugars daily. ICD code: E11.29. 100 each 12  . insulin glargine (LANTUS) 100 UNIT/ML injection Inject 0.4 mLs (40 Units total) into the skin at bedtime. 20 mL 11  . Insulin Syringe-Needle U-100 (ELITE-THIN INS SYR .5CC/31G) 31G X 5/16" 0.5 ML MISC Use 4 times daily to inject insulin. diag code 250.42. Insulin dependent 125 each 6  . Lancets Misc. (ACCU-CHEK FASTCLIX LANCET) KIT Use with  lancets to check glucose fasting and with meals daily. 1 kit 1  . lisinopril (PRINIVIL,ZESTRIL) 10 MG tablet Take 1 tablet (10 mg total) by mouth daily. 30 tablet 11  . Multiple Vitamins-Minerals (MULTIVITAMIN PO) Take 1 tablet by mouth daily.    . nadolol (CORGARD) 80 MG tablet Take 1 tablet (80 mg total) by mouth daily. 90 tablet 3  . omeprazole (PRILOSEC) 40 MG capsule Take 40 mg by mouth daily.    . pantoprazole (PROTONIX) 40 MG tablet Take 1 tablet (40 mg total) by mouth 2 (two) times daily. 60 tablet 11  . polyethylene glycol powder (GLYCOLAX/MIRALAX) powder Take 17 g by mouth daily.    . propranolol (INDERAL) 10 MG tablet Take 10 mg by mouth 3 (three) times daily.    Marland Kitchen glucose blood (ACCU-CHEK SMARTVIEW) test strip Use as instructed 100 each 12  . zoster vaccine live, PF, (ZOSTAVAX) 70177 UNT/0.65ML injection Inject 19,400 Units into the skin once. 1 each 0   Current Facility-Administered Medications  Medication Dose Route Frequency Provider Last Rate Last Dose  . Influenza vac split quadrivalent PF (FLUARIX) injection 0.5 mL  0.5 mL Intramuscular Once Curt Bears, MD        SURGICAL HISTORY:  Past Surgical History  Procedure Laterality Date  . Colon surgery    . Hernia repair    . Esophagogastroduodenoscopy  02/15/2012    Procedure: ESOPHAGOGASTRODUODENOSCOPY (EGD);  Surgeon: Estanislado Emms., MD,FACG;  Location: [facility] ENDOSCOPY;  Service: Endoscopy;  Laterality: N/A;  . Esophagogastroduodenoscopy N/A 03/22/2013    Procedure: ESOPHAGOGASTRODUODENOSCOPY (EGD);  Surgeon: Jerene Bears, MD;  Location: [facility];  Service: Gastroenterology;  Laterality: N/A;    REVIEW OF SYSTEMS:  A comprehensive review of systems was negative.   PHYSICAL EXAMINATION: General appearance: alert, cooperative and no distress Head: Normocephalic, without obvious abnormality, atraumatic Neck: no adenopathy Resp: clear to auscultation bilaterally Cardio: regular rate and rhythm, S1, S2 normal, no  murmur, click, rub or gallop GI: soft, non-tender; bowel sounds normal; no masses,  no organomegaly Extremities: extremities normal, atraumatic, no cyanosis or edema  ECOG PERFORMANCE STATUS: 1 - Symptomatic but completely ambulatory  Blood pressure 151/95, pulse 55, temperature 97.8 F (36.6 C), temperature source Oral, resp. rate 18, height 6' (1.829 m), weight 189 lb 1.6 oz (85.775 kg), SpO2 100 %.  LABORATORY DATA: Lab Results  Component Value Date   WBC 3.2* 12/06/2014   HGB 12.3* 12/06/2014   HCT 36.6* 12/06/2014   MCV 104.3* 12/06/2014   PLT 53* 12/06/2014      Chemistry      Component Value Date/Time   NA 138 12/06/2014 1322   NA 137 03/22/2013 0630   NA 140 05/23/2012 0925   K 4.8 12/06/2014 1322   K 3.8 03/22/2013 0630   K 5.1* 05/23/2012 0925   CL 105 03/22/2013 0630   CL 101 11/24/2012 1427   CL 97* 05/23/2012 0925   CO2 22 12/06/2014 1322  CO2 21 03/22/2013 0630   CO2 31 05/23/2012 0925   BUN 10.6 12/06/2014 1322   BUN 29* 03/22/2013 0630   BUN 11 05/23/2012 0925   CREATININE 1.2 12/06/2014 1322   CREATININE 1.19 03/22/2013 0630   CREATININE 1.3* 05/23/2012 0925      Component Value Date/Time   CALCIUM 9.2 12/06/2014 1322   CALCIUM 8.8 03/22/2013 0630   CALCIUM 9.4 05/23/2012 0925   ALKPHOS 79 12/06/2014 1322   ALKPHOS 50 03/22/2013 0630   ALKPHOS 101* 05/23/2012 0925   AST 62* 12/06/2014 1322   AST 48* 03/22/2013 0630   AST 51* 05/23/2012 0925   ALT 36 12/06/2014 1322   ALT 24 03/22/2013 0630   ALT 40 05/23/2012 0925   BILITOT 1.47* 12/06/2014 1322   BILITOT 1.3* 03/22/2013 0630   BILITOT 1.20 05/23/2012 0925       RADIOGRAPHIC STUDIES: Ct Soft Tissue Neck W Contrast  12/06/2014   CLINICAL DATA:  History of tonsillar cancer status post chemotherapy and radiation therapy, complete. History of colon cancer status post surgery. History of prostate cancer status post chemotherapy and radiation therapy, complete. Staging.  EXAM: CT NECK WITH  CONTRAST  TECHNIQUE: Multidetector CT imaging of the neck was performed using the standard protocol following the bolus administration of intravenous contrast.  CONTRAST:  137mL OMNIPAQUE IOHEXOL 300 MG/ML  SOLN  COMPARISON:  12/03/2013  FINDINGS: The visualized portion of the brain demonstrates mild to moderate cerebral atrophy. Orbits are unremarkable. Deformity of the left maxillary sinus is unchanged and suggestive of prior fracture. Trace bilateral maxillary sinus mucosal thickening is noted. Mastoid air cells are clear.  The nasopharynx, oropharynx, and oral cavity are unremarkable without mass identified. Larynx is unremarkable. No enlarged lymph nodes are identified in the neck. The thyroid gland is unremarkable. Submandibular glands are small. Asymmetric fatty infiltration of the left parotid gland may reflect prior radiation therapy, unchanged.  Extensive atherosclerotic calcification is present at the carotid bifurcations without evidence of significant stenosis. Deformity of the medial left clavicle is unchanged. Moderate lower cervical disc degeneration is noted.  Left pleural effusion is partially visualized, more fully evaluated on separate dedicated chest CT.  IMPRESSION: No evidence of recurrent disease in the neck.   Electronically Signed   By: Logan Bores   On: 12/06/2014 16:49   Ct Chest W Contrast  12/06/2014   CLINICAL DATA:  Tonsillar carcinoma diagnosed in 2010. Chemotherapy and radiation therapy completed. Colon cancer diagnosed in 2011 status post surgical resection. Prostate cancer diagnosed in 2012 ; chemotherapy and radiation therapy completed. Subsequent encounter.  EXAM: CT CHEST, ABDOMEN, AND PELVIS WITH CONTRAST  TECHNIQUE: Multidetector CT imaging of the chest, abdomen and pelvis was performed following the standard protocol during bolus administration of intravenous contrast.  CONTRAST:  137mL OMNIPAQUE IOHEXOL 300 MG/ML  SOLN  COMPARISON:  Prior CTs dated 11/24/2012 and  12/03/2013.  FINDINGS: CT CHEST FINDINGS  Neck findings have been dictated separately.  Mediastinum: There are no enlarged mediastinal or hilar lymph nodes. The esophagus demonstrates diffuse wall thickening which is similar to the prior examination. There is herniation of omental fat and nodularity through the esophageal hiatus. Minimal thyroid nodularity appears stable. The heart size is normal. There is stable loculated fluid anteriorly within the pericardium.Atherosclerosis of the aorta, great vessels and coronary arteries appears unchanged.  Lungs/Pleura: There is a new moderate size dependent left pleural effusion without associated nodularity. There is no right pleural effusion. Biapical radiation changes and mild dependent left lower  lobe atelectasis are noted.  Musculoskeletal/Chest wall: No chest wall lesion or suspicious osseous findings demonstrated.  CT ABDOMEN AND PELVIS FINDINGS  Hepatobiliary: There are progressive changes of cirrhosis throughout the liver which demonstrates a shrunken appearance. No focal lesions demonstrated. Small calcified gallstones are grossly stable. There is no significant gallbladder wall thickening or biliary dilatation.  Pancreas: There is chronic pancreatic atrophy and calcifications within the pancreatic tail. No pancreatic mass or ductal dilatation identified.  Spleen: Stable splenomegaly. Ill-defined small subcapsular lesion on image 50 7 May reflect a small chronic infarct. Next found  Adrenals/Urinary Tract: Both adrenal glands appear normal.There are probable small nonobstructing renal calculi bilaterally. There is no evidence of renal mass or hydronephrosis. No bladder abnormalities are seen.  Stomach/Bowel: There is progressive diffuse wall thickening of the stomach and colon without apparent focal mass or obstruction. Midline hernias containing loops of small bowel are again noted without evidence of incarceration or obstruction.There is progressive ascites with  diffuse nodularity throughout the mesenteric fat and omentum highly worrisome for omental carcinomatosis. Fluid extends into a right inguinal hernia.  Vascular/Lymphatic: There are multiple small lymph nodes within the gastrohepatic ligament, extending through the diaphragmatic hiatus into the lower chest. Small mesenteric and retroperitoneal lymph nodes are also noted. There is mild aortoiliac atherosclerosis. 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  Reproductive: There are stable postsurgical changes within the prostate gland which demonstrates no focal mass.  Other: None.  Musculoskeletal: No acute or significant osseous findings. No evidence of osseous metastatic disease.  IMPRESSION: 1. Progressive ascites with nodularity throughout the omentum and mesenteric fat highly worrisome for peritoneal carcinomatosis. Tumor likely extends through the esophageal hiatus into the lower chest. 2. Progressive changes of cirrhosis and portal hypertension. 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 3. Diffuse esophageal wall thickening is similar to the prior examination and likely related to esophagitis and/or liver disease. Likewise, gastric and colonic wall thickening is noted. Ventral hernias containing small bowel loops and fluid are again noted. There is no evidence of bowel incarceration or obstruction. 4. Stable pancreatic atrophy and calcifications. 5. New moderate size left pleural effusion without definite malignant features.   Electronically Signed   By: Roxy Horseman M.D.   On: 12/06/2014 18:06   Ct Abdomen Pelvis W Contrast  12/06/2014   CLINICAL DATA:  Tonsillar carcinoma diagnosed in 2010. Chemotherapy and radiation therapy completed. Colon cancer diagnosed in 2011 status post surgical resection. Prostate cancer diagnosed in 2012 ; chemotherapy and radiation therapy completed. Subsequent encounter.  EXAM: CT CHEST, ABDOMEN, AND  PELVIS WITH CONTRAST  TECHNIQUE: Multidetector CT imaging of the chest, abdomen and pelvis was performed following the standard protocol during bolus administration of intravenous contrast.  CONTRAST:  OMNIPAQUE IOHEXOL 300 MG/ML  SOLN  COMPARISON:  Prior CTs dated 11/24/2012 and 12/03/2013.  FINDINGS: CT CHEST FINDINGS  Neck findings have been dictated separately.  Mediastinum: There are no enlarged mediastinal or hilar lymph nodes. The esophagus demonstrates diffuse wall thickening which is similar to the prior examination. There is herniation of omental fat and nodularity through the esophageal hiatus. Minimal thyroid nodularity appears stable. The heart size is normal. There is stable loculated fluid anteriorly within the pericardium.Atherosclerosis of the aorta, great vessels and coronary arteries appears unchanged.  Lungs/Pleura: There is a new moderate size dependent left pleural effusion without associated nodularity. There is no right pleural effusion. Biapical radiation changes and mild dependent left lower lobe atelectasis are  noted.  Musculoskeletal/Chest wall: No chest wall lesion or suspicious osseous findings demonstrated.  CT ABDOMEN AND PELVIS FINDINGS  Hepatobiliary: There are progressive changes of cirrhosis throughout the liver which demonstrates a shrunken appearance. No focal lesions demonstrated. Small calcified gallstones are grossly stable. There is no significant gallbladder wall thickening or biliary dilatation.  Pancreas: There is chronic pancreatic atrophy and calcifications within the pancreatic tail. No pancreatic mass or ductal dilatation identified.  Spleen: Stable splenomegaly. Ill-defined small subcapsular lesion on image 50 7 May reflect a small chronic infarct. Next found  Adrenals/Urinary Tract: Both adrenal glands appear normal.There are probable small nonobstructing renal calculi bilaterally. There is no evidence of renal mass or hydronephrosis. No bladder abnormalities  are seen.  Stomach/Bowel: There is progressive diffuse wall thickening of the stomach and colon without apparent focal mass or obstruction. Midline hernias containing loops of small bowel are again noted without evidence of incarceration or obstruction.There is progressive ascites with diffuse nodularity throughout the mesenteric fat and omentum highly worrisome for omental carcinomatosis. Fluid extends into a right inguinal hernia.  Vascular/Lymphatic: There are multiple small lymph nodes within the gastrohepatic ligament, extending through the diaphragmatic hiatus into the lower chest. Small mesenteric and retroperitoneal lymph nodes are also noted. There is mild aortoiliac atherosclerosis. 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  Reproductive: There are stable postsurgical changes within the prostate gland which demonstrates no focal mass.  Other: None.  Musculoskeletal: No acute or significant osseous findings. No evidence of osseous metastatic disease.  IMPRESSION: 1. Progressive ascites with nodularity throughout the omentum and mesenteric fat highly worrisome for peritoneal carcinomatosis. Tumor likely extends through the esophageal hiatus into the lower chest. 2. Progressive changes of cirrhosis and portal hypertension. 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 3. Diffuse esophageal wall thickening is similar to the prior examination and likely related to esophagitis and/or liver disease. Likewise, gastric and colonic wall thickening is noted. Ventral hernias containing small bowel loops and fluid are again noted. There is no evidence of bowel incarceration or obstruction. 4. Stable pancreatic atrophy and calcifications. 5. New moderate size left pleural effusion without definite malignant features.   Electronically Signed   By: Camie Patience M.D.   On: 12/06/2014 18:06    ASSESSMENT AND PLAN: This is a very pleasant 73 years  old [place] American male with multiple malignancies including history of tonsillar cancer, colon adenocarcinoma as well as prostate cancer. The patient also has a history of hepatitis, liver cirrhosis, gastrointestinal bleed as well as pancytopenia and mesenteric vein thrombosis.   His recent CT scan of the abdomen showed progressive ascites with nodularity throughout the omentum and mesenteric fat highly worrisome for peritoneal carcinomatosis but interestingly the patient is completely asymptomatic and he feels very well. I discussed the scan results with the patient and recommended for him to have a PET scan for evaluation of these abnormalities seen on the CT scan of the abdomen. I will also order MRI of the brain to evaluate his recent visual disturbances. I will see the patient back for follow-up visit in 2 weeks for reevaluation and discussion of his treatment options based on the imaging studies. He was advised to call me immediately if he has any concerning symptoms in the interval. Based on the patient his request, I spoke to his sister Dr. Sherral Hammers in [place], [place] [phone] to update her about his condition.  All questions were answered. The patient knows to call the  clinic with any problems, questions or concerns. We can certainly see the patient much sooner if necessary.  Disclaimer: This note was dictated with voice recognition software. Similar sounding words can inadvertently be transcribed and may be missed upon review.

## 2014-12-08 ENCOUNTER — Other Ambulatory Visit: Payer: Self-pay | Admitting: *Deleted

## 2014-12-09 MED ORDER — PANTOPRAZOLE SODIUM 40 MG PO TBEC: 40.0000 mg | DELAYED_RELEASE_TABLET | Freq: Two times a day (BID) | ORAL | Status: AC

## 2014-12-09 MED ORDER — LISINOPRIL 10 MG PO TABS: 10.0000 mg | ORAL_TABLET | Freq: Every day | ORAL | Status: AC

## 2014-12-09 MED ORDER — "INSULIN SYRINGE-NEEDLE U-100 31G X 5/16"" 0.5 ML MISC": Status: AC

## 2014-12-14 ENCOUNTER — Inpatient Hospital Stay (HOSPITAL_COMMUNITY): Payer: Medicare Other

## 2014-12-14 ENCOUNTER — Encounter (HOSPITAL_COMMUNITY): Payer: Self-pay

## 2014-12-14 ENCOUNTER — Emergency Department (HOSPITAL_COMMUNITY): Payer: Medicare Other

## 2014-12-14 ENCOUNTER — Inpatient Hospital Stay (HOSPITAL_COMMUNITY)
Admission: EM | Admit: 2014-12-14 | Discharge: 2014-12-31 | DRG: 100 | Disposition: E | Payer: Medicare Other | Attending: Pulmonary Disease | Admitting: Pulmonary Disease

## 2014-12-14 DIAGNOSIS — D638 Anemia in other chronic diseases classified elsewhere: Secondary | ICD-10-CM | POA: Diagnosis present

## 2014-12-14 DIAGNOSIS — R319 Hematuria, unspecified: Secondary | ICD-10-CM | POA: Diagnosis present

## 2014-12-14 DIAGNOSIS — Z515 Encounter for palliative care: Secondary | ICD-10-CM | POA: Diagnosis not present

## 2014-12-14 DIAGNOSIS — K7290 Hepatic failure, unspecified without coma: Secondary | ICD-10-CM | POA: Diagnosis present

## 2014-12-14 DIAGNOSIS — K766 Portal hypertension: Secondary | ICD-10-CM | POA: Diagnosis present

## 2014-12-14 DIAGNOSIS — R609 Edema, unspecified: Secondary | ICD-10-CM | POA: Diagnosis present

## 2014-12-14 DIAGNOSIS — Z6826 Body mass index (BMI) 26.0-26.9, adult: Secondary | ICD-10-CM

## 2014-12-14 DIAGNOSIS — C786 Secondary malignant neoplasm of retroperitoneum and peritoneum: Secondary | ICD-10-CM | POA: Diagnosis present

## 2014-12-14 DIAGNOSIS — Z794 Long term (current) use of insulin: Secondary | ICD-10-CM | POA: Diagnosis not present

## 2014-12-14 DIAGNOSIS — E872 Acidosis: Secondary | ICD-10-CM | POA: Diagnosis present

## 2014-12-14 DIAGNOSIS — J90 Pleural effusion, not elsewhere classified: Secondary | ICD-10-CM | POA: Diagnosis present

## 2014-12-14 DIAGNOSIS — C801 Malignant (primary) neoplasm, unspecified: Secondary | ICD-10-CM

## 2014-12-14 DIAGNOSIS — G40301 Generalized idiopathic epilepsy and epileptic syndromes, not intractable, with status epilepticus: Secondary | ICD-10-CM

## 2014-12-14 DIAGNOSIS — Q256 Stenosis of pulmonary artery: Secondary | ICD-10-CM

## 2014-12-14 DIAGNOSIS — S069X9A Unspecified intracranial injury with loss of consciousness of unspecified duration, initial encounter: Secondary | ICD-10-CM | POA: Diagnosis present

## 2014-12-14 DIAGNOSIS — R4189 Other symptoms and signs involving cognitive functions and awareness: Secondary | ICD-10-CM

## 2014-12-14 DIAGNOSIS — N182 Chronic kidney disease, stage 2 (mild): Secondary | ICD-10-CM | POA: Diagnosis present

## 2014-12-14 DIAGNOSIS — Z85038 Personal history of other malignant neoplasm of large intestine: Secondary | ICD-10-CM

## 2014-12-14 DIAGNOSIS — Z79899 Other long term (current) drug therapy: Secondary | ICD-10-CM

## 2014-12-14 DIAGNOSIS — E1143 Type 2 diabetes mellitus with diabetic autonomic (poly)neuropathy: Secondary | ICD-10-CM | POA: Diagnosis present

## 2014-12-14 DIAGNOSIS — Z85818 Personal history of malignant neoplasm of other sites of lip, oral cavity, and pharynx: Secondary | ICD-10-CM

## 2014-12-14 DIAGNOSIS — Z8546 Personal history of malignant neoplasm of prostate: Secondary | ICD-10-CM

## 2014-12-14 DIAGNOSIS — K7031 Alcoholic cirrhosis of liver with ascites: Secondary | ICD-10-CM | POA: Diagnosis present

## 2014-12-14 DIAGNOSIS — K219 Gastro-esophageal reflux disease without esophagitis: Secondary | ICD-10-CM | POA: Diagnosis present

## 2014-12-14 DIAGNOSIS — J690 Pneumonitis due to inhalation of food and vomit: Secondary | ICD-10-CM | POA: Diagnosis present

## 2014-12-14 DIAGNOSIS — C800 Disseminated malignant neoplasm, unspecified: Secondary | ICD-10-CM | POA: Diagnosis present

## 2014-12-14 DIAGNOSIS — K3184 Gastroparesis: Secondary | ICD-10-CM | POA: Diagnosis present

## 2014-12-14 DIAGNOSIS — Z66 Do not resuscitate: Secondary | ICD-10-CM | POA: Diagnosis present

## 2014-12-14 DIAGNOSIS — G9340 Encephalopathy, unspecified: Secondary | ICD-10-CM | POA: Insufficient documentation

## 2014-12-14 DIAGNOSIS — G40901 Epilepsy, unspecified, not intractable, with status epilepticus: Principal | ICD-10-CM | POA: Insufficient documentation

## 2014-12-14 DIAGNOSIS — Z7982 Long term (current) use of aspirin: Secondary | ICD-10-CM

## 2014-12-14 DIAGNOSIS — C799 Secondary malignant neoplasm of unspecified site: Secondary | ICD-10-CM | POA: Insufficient documentation

## 2014-12-14 DIAGNOSIS — E43 Unspecified severe protein-calorie malnutrition: Secondary | ICD-10-CM | POA: Diagnosis present

## 2014-12-14 DIAGNOSIS — I129 Hypertensive chronic kidney disease with stage 1 through stage 4 chronic kidney disease, or unspecified chronic kidney disease: Secondary | ICD-10-CM | POA: Diagnosis present

## 2014-12-14 DIAGNOSIS — R4020 Unspecified coma: Secondary | ICD-10-CM

## 2014-12-14 DIAGNOSIS — J9600 Acute respiratory failure, unspecified whether with hypoxia or hypercapnia: Secondary | ICD-10-CM | POA: Diagnosis present

## 2014-12-14 DIAGNOSIS — R569 Unspecified convulsions: Secondary | ICD-10-CM

## 2014-12-14 DIAGNOSIS — T82594A Other mechanical complication of infusion catheter, initial encounter: Secondary | ICD-10-CM

## 2014-12-14 DIAGNOSIS — J9601 Acute respiratory failure with hypoxia: Secondary | ICD-10-CM

## 2014-12-14 DIAGNOSIS — R40243 Glasgow coma scale score 3-8: Secondary | ICD-10-CM

## 2014-12-14 LAB — URINALYSIS, ROUTINE W REFLEX MICROSCOPIC
Bilirubin Urine: NEGATIVE
GLUCOSE, UA: NEGATIVE mg/dL
KETONES UR: 15 mg/dL — AB
Leukocytes, UA: NEGATIVE
Nitrite: NEGATIVE
PH: 6 (ref 5.0–8.0)
Protein, ur: 100 mg/dL — AB
SPECIFIC GRAVITY, URINE: 1.02 (ref 1.005–1.030)
UROBILINOGEN UA: 1 mg/dL (ref 0.0–1.0)

## 2014-12-14 LAB — CBC WITH DIFFERENTIAL/PLATELET
BASOS ABS: 0 10*3/uL (ref 0.0–0.1)
BASOS PCT: 0 % (ref 0–1)
EOS ABS: 0 10*3/uL (ref 0.0–0.7)
Eosinophils Relative: 0 % (ref 0–5)
HCT: 39.1 % (ref 39.0–52.0)
Hemoglobin: 13.2 g/dL (ref 13.0–17.0)
Lymphocytes Relative: 9 % — ABNORMAL LOW (ref 12–46)
Lymphs Abs: 0.6 10*3/uL — ABNORMAL LOW (ref 0.7–4.0)
MCH: 34.9 pg — AB (ref 26.0–34.0)
MCHC: 33.8 g/dL (ref 30.0–36.0)
MCV: 103.4 fL — ABNORMAL HIGH (ref 78.0–100.0)
Monocytes Absolute: 0.6 10*3/uL (ref 0.1–1.0)
Monocytes Relative: 10 % (ref 3–12)
Neutro Abs: 5.4 10*3/uL (ref 1.7–7.7)
Neutrophils Relative %: 81 % — ABNORMAL HIGH (ref 43–77)
PLATELETS: 71 10*3/uL — AB (ref 150–400)
RBC: 3.78 MIL/uL — ABNORMAL LOW (ref 4.22–5.81)
RDW: 14.9 % (ref 11.5–15.5)
WBC: 6.6 10*3/uL (ref 4.0–10.5)

## 2014-12-14 LAB — CBG MONITORING, ED: GLUCOSE-CAPILLARY: 129 mg/dL — AB (ref 70–99)

## 2014-12-14 LAB — I-STAT ARTERIAL BLOOD GAS, ED
Acid-base deficit: 6 mmol/L — ABNORMAL HIGH (ref 0.0–2.0)
BICARBONATE: 17 meq/L — AB (ref 20.0–24.0)
O2 SAT: 98 %
PO2 ART: 103 mmHg — AB (ref 80.0–100.0)
TCO2: 18 mmol/L (ref 0–100)
pCO2 arterial: 27.8 mmHg — ABNORMAL LOW (ref 35.0–45.0)
pH, Arterial: 7.396 (ref 7.350–7.450)

## 2014-12-14 LAB — COMPREHENSIVE METABOLIC PANEL
ALK PHOS: 53 U/L (ref 39–117)
ALT: 39 U/L (ref 0–53)
AST: 94 U/L — ABNORMAL HIGH (ref 0–37)
Albumin: 3 g/dL — ABNORMAL LOW (ref 3.5–5.2)
Anion gap: 21 — ABNORMAL HIGH (ref 5–15)
BUN: 25 mg/dL — AB (ref 6–23)
CALCIUM: 9.2 mg/dL (ref 8.4–10.5)
CO2: 16 mEq/L — ABNORMAL LOW (ref 19–32)
Chloride: 96 mEq/L (ref 96–112)
Creatinine, Ser: 1.31 mg/dL (ref 0.50–1.35)
GFR, EST AFRICAN AMERICAN: 62 mL/min — AB (ref >90)
GFR, EST NON AFRICAN AMERICAN: 53 mL/min — AB (ref >90)
GLUCOSE: 140 mg/dL — AB (ref 70–99)
POTASSIUM: 5.2 meq/L (ref 3.7–5.3)
Sodium: 133 mEq/L — ABNORMAL LOW (ref 137–147)
Total Bilirubin: 2.6 mg/dL — ABNORMAL HIGH (ref 0.3–1.2)
Total Protein: 6.9 g/dL (ref 6.0–8.3)

## 2014-12-14 LAB — TROPONIN I

## 2014-12-14 LAB — TRIGLYCERIDES: TRIGLYCERIDES: 135 mg/dL (ref <150)

## 2014-12-14 LAB — URINE MICROSCOPIC-ADD ON

## 2014-12-14 LAB — LACTIC ACID, PLASMA: Lactic Acid, Venous: 2.5 mmol/L — ABNORMAL HIGH (ref 0.5–2.2)

## 2014-12-14 LAB — PRO B NATRIURETIC PEPTIDE: PRO B NATRI PEPTIDE: 4563 pg/mL — AB (ref 0–125)

## 2014-12-14 LAB — I-STAT CHEM 8, ED
BUN: 26 mg/dL — AB (ref 6–23)
CALCIUM ION: 1.13 mmol/L (ref 1.13–1.30)
CHLORIDE: 102 meq/L (ref 96–112)
CREATININE: 1.4 mg/dL — AB (ref 0.50–1.35)
Glucose, Bld: 142 mg/dL — ABNORMAL HIGH (ref 70–99)
HCT: 44 % (ref 39.0–52.0)
Hemoglobin: 15 g/dL (ref 13.0–17.0)
Potassium: 5 mEq/L (ref 3.7–5.3)
Sodium: 133 mEq/L — ABNORMAL LOW (ref 137–147)
TCO2: 17 mmol/L (ref 0–100)

## 2014-12-14 LAB — GLUCOSE, CAPILLARY
GLUCOSE-CAPILLARY: 196 mg/dL — AB (ref 70–99)
Glucose-Capillary: 181 mg/dL — ABNORMAL HIGH (ref 70–99)

## 2014-12-14 LAB — RAPID URINE DRUG SCREEN, HOSP PERFORMED
AMPHETAMINES: NOT DETECTED
Barbiturates: NOT DETECTED
Benzodiazepines: NOT DETECTED
Cocaine: NOT DETECTED
Opiates: NOT DETECTED
Tetrahydrocannabinol: NOT DETECTED

## 2014-12-14 LAB — I-STAT CG4 LACTIC ACID, ED: Lactic Acid, Venous: 6.56 mmol/L — ABNORMAL HIGH (ref 0.5–2.2)

## 2014-12-14 LAB — MRSA PCR SCREENING: MRSA BY PCR: NEGATIVE

## 2014-12-14 LAB — ETHANOL: Alcohol, Ethyl (B): <11 mg/dL (ref 0–11)

## 2014-12-14 MED ORDER — LORAZEPAM 2 MG/ML IJ SOLN
INTRAMUSCULAR | Status: AC
  Administered 2014-12-14: 1 mg
  Filled 2014-12-14: qty 1

## 2014-12-14 MED ORDER — NALOXONE HCL 1 MG/ML IJ SOLN
INTRAMUSCULAR | Status: AC
  Filled 2014-12-14: qty 2

## 2014-12-14 MED ORDER — GADOBENATE DIMEGLUMINE 529 MG/ML IV SOLN
18.0000 mL | Freq: Once | INTRAVENOUS | Status: AC | PRN
  Administered 2014-12-14: 18 mL via INTRAVENOUS

## 2014-12-14 MED ORDER — HYDRALAZINE HCL 20 MG/ML IJ SOLN: 10.0000 mg | INTRAMUSCULAR | Status: DC | PRN

## 2014-12-14 MED ORDER — LORAZEPAM 2 MG/ML IJ SOLN
INTRAMUSCULAR | Status: AC
  Filled 2014-12-14: qty 1

## 2014-12-14 MED ORDER — LIDOCAINE HCL (CARDIAC) 20 MG/ML IV SOLN
INTRAVENOUS | Status: AC
  Filled 2014-12-14: qty 5

## 2014-12-14 MED ORDER — LEVETIRACETAM IN NACL 500 MG/100ML IV SOLN
500.0000 mg | Freq: Two times a day (BID) | INTRAVENOUS | Status: DC
  Administered 2014-12-14 – 2014-12-16 (×4): 500 mg via INTRAVENOUS
  Filled 2014-12-14 (×5): qty 100

## 2014-12-14 MED ORDER — INSULIN ASPART 100 UNIT/ML ~~LOC~~ SOLN
0.0000 [IU] | SUBCUTANEOUS | Status: DC
  Administered 2014-12-14 (×2): 3 [IU] via SUBCUTANEOUS
  Administered 2014-12-15 (×2): 2 [IU] via SUBCUTANEOUS

## 2014-12-14 MED ORDER — HEPARIN SODIUM (PORCINE) 5000 UNIT/ML IJ SOLN
5000.0000 [IU] | Freq: Three times a day (TID) | INTRAMUSCULAR | Status: DC
  Administered 2014-12-14 – 2014-12-15 (×2): 5000 [IU] via SUBCUTANEOUS
  Filled 2014-12-14 (×5): qty 1

## 2014-12-14 MED ORDER — CETYLPYRIDINIUM CHLORIDE 0.05 % MT LIQD
7.0000 mL | Freq: Four times a day (QID) | OROMUCOSAL | Status: DC
  Administered 2014-12-14 – 2014-12-16 (×9): 7 mL via OROMUCOSAL

## 2014-12-14 MED ORDER — ROCURONIUM BROMIDE 50 MG/5ML IV SOLN
INTRAVENOUS | Status: AC
  Administered 2014-12-14: 100 mg
  Filled 2014-12-14: qty 2

## 2014-12-14 MED ORDER — CHLORHEXIDINE GLUCONATE 0.12 % MT SOLN
15.0000 mL | Freq: Two times a day (BID) | OROMUCOSAL | Status: DC
  Administered 2014-12-14 – 2014-12-16 (×4): 15 mL via OROMUCOSAL
  Filled 2014-12-14 (×4): qty 15

## 2014-12-14 MED ORDER — ETOMIDATE 2 MG/ML IV SOLN
INTRAVENOUS | Status: AC
  Administered 2014-12-14: 20 mg
  Filled 2014-12-14: qty 20

## 2014-12-14 MED ORDER — LEVETIRACETAM IN NACL 1000 MG/100ML IV SOLN
1000.0000 mg | Freq: Once | INTRAVENOUS | Status: AC
  Administered 2014-12-14: 1000 mg via INTRAVENOUS
  Filled 2014-12-14: qty 100

## 2014-12-14 MED ORDER — SUCCINYLCHOLINE CHLORIDE 20 MG/ML IJ SOLN
INTRAMUSCULAR | Status: AC
  Filled 2014-12-14: qty 1

## 2014-12-14 MED ORDER — FENTANYL CITRATE 0.05 MG/ML IJ SOLN
50.0000 ug | INTRAMUSCULAR | Status: DC | PRN
  Administered 2014-12-14 – 2014-12-16 (×10): 50 ug via INTRAVENOUS
  Filled 2014-12-14 (×10): qty 2

## 2014-12-14 MED ORDER — PROPOFOL 10 MG/ML IV EMUL
0.0000 ug/kg/min | INTRAVENOUS | Status: DC
  Administered 2014-12-14 – 2014-12-16 (×4): 10 ug/kg/min via INTRAVENOUS
  Filled 2014-12-14 (×4): qty 100

## 2014-12-14 MED ORDER — SODIUM CHLORIDE 0.9 % IV SOLN
Freq: Once | INTRAVENOUS | Status: AC
  Administered 2014-12-14: 900 mL via INTRAVENOUS

## 2014-12-14 MED ORDER — PANTOPRAZOLE SODIUM 40 MG PO PACK
40.0000 mg | PACK | Freq: Every day | ORAL | Status: DC
  Administered 2014-12-14 – 2014-12-15 (×2): 40 mg
  Filled 2014-12-14 (×3): qty 20

## 2014-12-14 MED ORDER — SODIUM CHLORIDE 0.9 % IV SOLN
250.0000 mL | INTRAVENOUS | Status: DC | PRN
  Administered 2014-12-15: 250 mL via INTRAVENOUS

## 2014-12-14 MED ORDER — NALOXONE HCL 1 MG/ML IJ SOLN
2.0000 mg | Freq: Once | INTRAMUSCULAR | Status: AC
  Administered 2014-12-14: 2 mg via INTRAVENOUS

## 2014-12-14 MED ORDER — LORAZEPAM 2 MG/ML IJ SOLN
2.0000 mg | Freq: Once | INTRAMUSCULAR | Status: AC
  Administered 2014-12-14: 2 mg via INTRAVENOUS

## 2014-12-14 NOTE — ED Notes (Signed)
Propofol held until eeg complete.

## 2014-12-14 NOTE — ED Notes (Signed)
Per [facility], pt from home found unresponsive by family and called 93. Witnessed seizure x 1 with EMS. Remained postictal during transport. Pt on NRB on arrival to ED. Remains unresponsive to tactile and verbal stimuli at this time.

## 2014-12-14 NOTE — Consult Note (Signed)
NEURO HOSPITALIST CONSULT NOTE    Reason for Consult: Seizure  HPI:                                                                                                                                         All of history obtained from chart.    Eddie [name] is an 73 y.o. 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 In addition he has a history of cirrhosis, GIB, pancytopenia, mesenteric vein thrombosis, T2DM. He most recently saw his oncologist 12/07/14 and was noted to be having visual hallucinations at that time, an MRI of the brain was ordered but has yet to be completed. He was found unresponsive by family friends after he was noted to not answer.  EMS arrived and patient had witnessed seizure. While in ED he had 2 further witnessed seizures. He was administered 2 mg Ativan and intubated.  Currently he is intubated and paralytics are still in system.  EEG is currently under way. 2 mg Ativan administered while hooked up to EEG with no change.   Past Medical History  Diagnosis Date  . Cancer     Status post curative radiotherapy with IMRT and androgen ablation for the prostate adenocarcinoma, completed December 10, 2008.  Marland Kitchen Cancer of colon     Status post left hemicolectomy at [facility] in November of 2010 revealing T3 N0 M0 colon adenocarcinoma and the patient did not receive adjuvant chemotherapy  . 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  Marland Kitchen Upper GI bleeding     EGD on 01/2011 shows esophagitis, grade 1 varices   . Hypertension   . Cirrhosis   . Anemia     bone marrow biopsy 11/2011,   . Diabetes mellitus   . Hepatitis C   . Gallstone   . Thrush, oral   . GERD (gastroesophageal reflux disease)   . DM gastroparesis   . Gastritis   . Hernia   . Pancreatitis   . Pancytopenia   . Cataract   . Gastric varices   . Colon polyp   . Diverticulosis     Past Surgical History  Procedure Laterality Date  . Colon surgery    . Hernia repair    . Esophagogastroduodenoscopy  02/15/2012    Procedure: ESOPHAGOGASTRODUODENOSCOPY (EGD);  Surgeon: Estanislado Emms., MD,FACG;  Location: [facility] ENDOSCOPY;  Service: Endoscopy;  Laterality: N/A;  . Esophagogastroduodenoscopy N/A 03/22/2013  Procedure: ESOPHAGOGASTRODUODENOSCOPY (EGD);  Surgeon: Jerene Bears, MD;  Location: [place];  Service: Gastroenterology;  Laterality: N/A;    Family History  Problem Relation Age of Onset  . Breast cancer Mother     mother still living and father passed with unsure reason.  . Alzheimer's disease Mother   . Colon cancer Neg Hx   . Esophageal cancer Neg Hx   . Stomach cancer Neg Hx   . Rectal cancer Neg Hx     Social History:  reports that he has never smoked. He has never used smokeless tobacco. He reports that he does not drink alcohol or use illicit drugs.  No Known Allergies  MEDICATIONS:                                                                                                                     Current Facility-Administered Medications  Medication Dose Route Frequency Provider Last Rate Last Dose  . 0.9 %  sodium chloride infusion  250 mL Intravenous PRN Donita Brooks, NP      . fentaNYL (SUBLIMAZE) injection 50 mcg  50 mcg Intravenous Q2H PRN Donita Brooks, NP      . heparin injection 5,000 Units  5,000 Units Subcutaneous 3 times per day Donita Brooks, NP      . levETIRAcetam (KEPPRA) IVPB 500 mg/100 mL premix  500 mg Intravenous Q12H Donita Brooks, NP      . lidocaine (cardiac) 100 mg/63ml  (XYLOCAINE) 20 MG/ML injection 2%           . pantoprazole sodium (PROTONIX) 40 mg/20 mL oral suspension 40 mg  40 mg Per Tube Q1200 Brandi L Ollis, NP      . propofol (DIPRIVAN) 10 mg/ml infusion  0-50 mcg/kg/min Intravenous Continuous Donita Brooks, NP      . succinylcholine (ANECTINE) 20 MG/ML injection            Current Outpatient Prescriptions  Medication Sig Dispense Refill  . aspirin EC 81 MG tablet Take 81 mg by mouth daily.    . Blood Glucose Monitoring Suppl (ACCU-CHEK NANO SMARTVIEW) W/DEVICE KIT 1 each by Does not apply route 2 (two) times daily. 1 kit 0  . ferrous sulfate 325 (65 FE) MG tablet Take 325 mg by mouth 2 (two) times daily with a meal.    . folic acid (FOLVITE) 188 MCG tablet Take 1 tablet by mouth 2 (two) times daily.    Marland Kitchen glucose blood (ACCU-CHEK SMARTVIEW) test strip Use as instructed 100 each 12  . glucose blood (ACCU-CHEK SMARTVIEW) test strip Check blood sugar up to 3 times daily 100 each 12  . glucose blood test strip Use to check blood sugars daily. ICD code: E11.29. 100 each 12  . insulin glargine (LANTUS) 100 UNIT/ML injection Inject 0.4 mLs (40 Units total) into the skin at bedtime. 20 mL 11  . Insulin Syringe-Needle U-100 (ELITE-THIN INS SYR .5CC/31G) 31G X 5/16" 0.5 ML MISC Use 4 times daily  to inject insulin. diag code E11.29. Insulin dependent 125 each 6  . Lancets Misc. (ACCU-CHEK FASTCLIX LANCET) KIT Use with lancets to check glucose fasting and with meals daily. 1 kit 1  . lisinopril (PRINIVIL,ZESTRIL) 10 MG tablet Take 1 tablet (10 mg total) by mouth daily. 90 tablet 4  . Multiple Vitamins-Minerals (MULTIVITAMIN PO) Take 1 tablet by mouth daily.    . nadolol (CORGARD) 80 MG tablet Take 1 tablet (80 mg total) by mouth daily. 90 tablet 3  . omeprazole (PRILOSEC) 40 MG capsule Take 40 mg by mouth daily.    . pantoprazole (PROTONIX) 40 MG tablet Take 1 tablet (40 mg total) by mouth 2 (two) times daily. 180 tablet 4  . polyethylene glycol powder  (GLYCOLAX/MIRALAX) powder Take 17 g by mouth daily.    . propranolol (INDERAL) 10 MG tablet Take 10 mg by mouth 3 (three) times daily.    Marland Kitchen zoster vaccine live, PF, (ZOSTAVAX) 10932 UNT/0.65ML injection Inject 19,400 Units into the skin once. 1 each 0      ROS:                                                                                                                                       History obtained from unobtainable from patient due to mental status    Blood pressure 202/91, pulse 67, temperature 100.9 F (38.3 C), temperature source Rectal, resp. rate 14, height $RemoveBe'5\' 10"'wzDOokCXN$  (1.778 m), SpO2 100 %.   Neurologic Examination:                                                                                                      HEENT-  Normocephalic, no lesions, without obvious abnormality.  Normal external eye and conjunctiva.  Normal TM's bilaterally.  Normal auditory canals and external ears. Normal external nose, mucus membranes and septum.  Normal pharynx. Cardiovascular- regular rate and rhythm, S1, S2 normal, no murmur, click, rub or gallop, pulses palpable throughout   Lungs- chest clear, no wheezing, rales, normal symmetric air entry Abdomen- soft, non-tender; bowel sounds normal; no masses,  no organomegaly Extremities- less then 2 second capillary refill Lymph-no adenopathy palpable Musculoskeletal-no joint tenderness, deformity or swelling Skin-warm and dry, no hyperpigmentation, vitiligo, or suspicious lesions  Neurological Examination Mental Status: Patient does not respond to verbal stimuli.  Does not respond to deep sternal rub.  Does not follow commands.  No verbalizations noted. Intubated and breathing with ventilator Cranial Nerves: II: patient does not respond confrontation  bilaterally, pupils right 3 mm, left 3 mm,and reactive bilaterally III,IV,VI: doll's response present bilaterally.  V,VII: corneal reflex absent bilaterally  VIII: patient does not respond to  verbal stimuli IX,X: gag reflex absent, XI: trapezius strength unable to test bilaterally XII: tongue strength unable to test Motor: Extremities flaccid throughout.  No spontaneous movement noted.  No purposeful movements noted. Sensory: Does not respond to noxious stimuli in any extremity. Deep Tendon Reflexes:  2+ bilateral UE and no KJ or AJ.  Plantars: absent bilaterally Cerebellar: Unable to perform  Lab Results: Basic Metabolic Panel:  Recent Labs Lab 12/23/2014 1253 12/28/2014 1301  NA 133* 133*  K 5.0 5.2  CL 102 96  CO2  --  16*  GLUCOSE 142* 140*  BUN 26* 25*  CREATININE 1.40* 1.31  CALCIUM  --  9.2    Liver Function Tests:  Recent Labs Lab 12/29/2014 1301  AST 94*  ALT 39  ALKPHOS 53  BILITOT 2.6*  PROT 6.9  ALBUMIN 3.0*   No results for input(s): LIPASE, AMYLASE in the last 168 hours. No results for input(s): AMMONIA in the last 168 hours.  CBC:  Recent Labs Lab 12/29/2014 1253 12/10/2014 1301  WBC  --  6.6  NEUTROABS  --  5.4  HGB 15.0 13.2  HCT 44.0 39.1  MCV  --  103.4*  PLT  --  71*    Cardiac Enzymes:  Recent Labs Lab 12/29/2014 1301  TROPONINI <0.30    Lipid Panel: No results for input(s): CHOL, TRIG, HDL, CHOLHDL, VLDL, LDLCALC in the last 168 hours.  CBG:  Recent Labs Lab 12/26/2014 1240  GLUCAP 129*    Microbiology: Results for orders placed or performed during the hospital encounter of 09/28/11  Surgical pcr screen     Status: Abnormal   Collection Time: 09/28/11  2:15 PM  Result Value Ref Range Status   MRSA, PCR NEGATIVE NEGATIVE Final   Staphylococcus aureus POSITIVE (A) NEGATIVE Final    Comment:        The Xpert SA Assay (FDA approved for NASAL specimens only), is one component of a comprehensive surveillance program.  It is not intended to diagnose infection nor to guide or monitor treatment.    Coagulation Studies: No results for input(s): LABPROT, INR in the last 72 hours.  Imaging: Ct Head Wo  Contrast  12/13/2014   CLINICAL DATA:  Patient with seizure. No known seizure history. History of metastatic mouth carcinoma.  EXAM: CT HEAD WITHOUT CONTRAST  TECHNIQUE: Contiguous axial images were obtained from the base of the skull through the vertex without intravenous contrast.  COMPARISON:  None.  FINDINGS: Ventricles and sulci are prominent, compatible with atrophy. Periventricular and subcortical white matter hypodensity compatible with chronic small vessel ischemic change. Age indeterminate left basal ganglia hypodensity. No definite intracranial mass lesion or significant mass effect. Calvarium is intact. Paranasal sinuses are well aerated. Unchanged deformity of the left maxillary sinus. Mastoid air cells are well aerated. Small amount of dependent fluid within the posterior nasopharynx.  IMPRESSION: Age-indeterminate left basal ganglia hypodensity may represent age-indeterminate left basal ganglia infarct.  Non contrast-enhanced examination is limited for the detection of intracranial metastatic disease. Consider further evaluation with contrast-enhanced MRI.   Electronically Signed   By: Lovey Newcomer M.D.   On: 12/19/2014 14:03   Dg Chest Portable 1 View  12/16/2014   CLINICAL DATA:  Unresponsive check endotracheal tube and nasogastric catheter placement  EXAM: PORTABLE CHEST - 1 VIEW  COMPARISON:  09/28/2011  FINDINGS: Endotracheal tube is noted approximately 2.8 cm above the carina. A nasogastric catheter is noted within the stomach. The cardiac shadow is within normal limits. The lungs are well aerated bilaterally. No focal infiltrate is seen. Persistent left effusion is noted posteriorly.  IMPRESSION: Tubes and lines as described.  Persistent left-sided effusion.  No acute abnormality is noted.   Electronically Signed   By: Inez Catalina M.D.   On: 12/22/2014 13:35    Etta Quill PA-C [facility] [phone]  12/03/2014, 2:32 PM   Patient seen and examined.  Clinical course  and management discussed.  Necessary edits performed.  I agree with the above.  Assessment and plan of care developed and discussed below.    Assessment/Plan:   73 year old male presenting with seizure activity requiring intubation and sedation.  Patient with a history of multiple malignancies.  Carcinoma of the colon felt to be widely metastatic.  Patient now with new onset seizures.  Head CT personally reviewed and shows no definitive evidence of a mass lesion but EEG shows persistent left sided PLEDS activity.  Although I do not think that this represents continued status epilepticus it is highly suggestive of a focal irritative lesion.  Metastasis is very likely in this clinical scenario.  Patient's clinical appearance is that of being chronically ill. Currently no evidence of continued seizure activity.  With compromised physical condition and metastasis, likely involving the brain, prognosis is poor. Would not be expected to even be able to tolerate the necessary therapies.  Recommendations: 1.  Continue Keppra at $RemoveB'500mg'gvkdJBbR$  q 12 hours 2.  Ativan prn   Alexis Goodell, MD [facility] [phone]  12/23/2014  5:16 PM

## 2014-12-14 NOTE — ED Notes (Signed)
Phlebotomy at the bedside  

## 2014-12-14 NOTE — ED Notes (Signed)
Assisted Lilia Pro, RN with placement of 53F foley cath at 1237 per VO by Colin Rhein, MD; urine return, amber in color with normal urine odor; urine specimen collected and sent to lab for testing

## 2014-12-14 NOTE — Procedures (Signed)
ELECTROENCEPHALOGRAM REPORT   Patient: Eddie [name]       Room #: 34M-13 EEG No. ID: [id number] Age: 73 y.o.        Sex: male Referring Physician: Dr Alva Garnet Report Date:  12/05/2014        Interpreting Physician: Jim Like, Larkin Ina  History:  [name] is an 73 y.o. male with an extensive history of multiple malignancies. He most recently saw his oncologist 12/07/14 and was noted to be having visual hallucinations at that time, an MRI of the brain was ordered but has yet to be completed. He was found unresponsive by family friends after he was noted to not answer. EMS arrived and patient had witnessed seizure. While in ED he had 2 further witnessed seizures. He was administered 2 mg Ativan and intubated. Currently he is intubated and paralytics are still in system. EEG is currently under way. 2 mg Ativan administered while hooked up to EEG with no change  Medications:  Scheduled: . heparin subcutaneous  5,000 Units Subcutaneous 3 times per day  . levETIRAcetam  500 mg Intravenous Q12H  . lidocaine (cardiac) 100 mg/56ml      . LORazepam      . pantoprazole sodium  40 mg Per Tube Q1200  . succinylcholine        Conditions of Recording:  This is a 16 channel EEG carried out with the patient in the drowsy state.  Description:  The background activity consists of a low voltage, poorly formed theta to delta activity seen from the parieto-occipital and posterior temporal regions.  A mixture of theta and delta activity is again seen in the frontal region. The exam is notable for periodic sharp wave activity predominantly in the left frontal-temporal region.  The patient does not appear to transition from waking to sleep. No hyperventilation or photic stimulation is performed.   IMPRESSION: Abnormal EEG due to the presence of frequent sharp wave activity located in the left fronto-temporal region consistent with a diagnosis of PLEDs. This can indicate a structural lesion such  as mass, infarct or infectious etiology.    Jim Like, DO [facility] [phone]  If 7pm- 7am, please page neurology on call as listed in [place]. 12/30/2014, 3:32 PM

## 2014-12-14 NOTE — Procedures (Signed)
Central Venous Catheter Insertion Procedure Note Eddie [name] [id number] [birth year]  Procedure: Insertion of Central Venous Catheter Indications: Assessment of intravascular volume, Drug and/or fluid administration and Frequent blood sampling  Procedure Details Consent: Risks of procedure as well as the alternatives and risks of each were explained to the (patient/caregiver).  Consent for procedure obtained. Time Out: Verified patient identification, verified procedure, site/side was marked, verified correct patient position, special equipment/implants available, medications/allergies/relevent history reviewed, required imaging and test results available.  Performed  Maximum sterile technique was used including antiseptics, cap, gloves, gown, hand hygiene, mask and sheet. Skin prep: Chlorhexidine; local anesthetic administered A antimicrobial bonded/coated triple lumen catheter was placed in the left internal jugular vein to 20 cm using the Seldinger technique.  Evaluation Blood flow good Complications: No apparent complications Patient did tolerate procedure well. Chest X-ray ordered to verify placement.  CXR: pending.    Procedure performed under direct supervision of Dr. Alva Garnet and with ultrasound guidance for real time vessel cannulation.      [name], NP-C [facility] Pgr: [phone] or [phone]   12/01/2014, 4:01 PM  I was present for and supervised the entire procedure  Merton Border, MD ; [facility] service [place] [phone].  After 5:30 PM or weekends, call [phone]

## 2014-12-14 NOTE — ED Notes (Signed)
EEG at bedside.

## 2014-12-14 NOTE — ED Provider Notes (Signed)
CSN: [id number]     Arrival date & time 12/06/2014  1218 History   First MD Initiated Contact with Patient 12/30/2014 1222     Chief Complaint  Patient presents with  . Seizures     (Consider location/radiation/quality/duration/timing/severity/associated sxs/prior Treatment) HPI 73 y.o. male last seen normal yesterday afternoon presents today with new onset seizures. History extremely limited by patient condition. I spoke with the patient's sister he states that she was unable to have the patient answer phone calls yesterday afternoon, sent a neighbor to investigate. On arrival the neighbor found the patient very mildly confused, however otherwise well. This morning the patient's sister attempted to call him and he did not answer so the patient's sister called the patient's friend to check on the patient. When she arrived the patient was slumped over in the couch, not responding. On EMS arrival the patient was breathing, intact pulses, other vitals unremarkable. He had a normal glucose. He had a roughly 30 second seizure like activity which self terminated. Level V caveat for altered mental status Past Medical History  Diagnosis Date  . Cancer     Status post curative radiotherapy with IMRT and androgen ablation for the prostate adenocarcinoma, completed December 10, 2008.  Marland Kitchen Cancer of colon     Status post left hemicolectomy at [facility] in November of 2010 revealing T3 N0 M0 colon adenocarcinoma and the patient did not receive adjuvant chemotherapy  . 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  Marland Kitchen Upper GI bleeding     EGD on 01/2011 shows esophagitis, grade 1 varices  . Hypertension   . Cirrhosis   . Anemia     bone marrow biopsy 11/2011,   . Diabetes mellitus   . Hepatitis C   . Gallstone   . Thrush, oral   . GERD (gastroesophageal reflux disease)   . DM  gastroparesis   . Gastritis   . Hernia   . Pancreatitis   . Pancytopenia   . Cataract   . Gastric varices   . Colon polyp   . Diverticulosis    Past Surgical History  Procedure Laterality Date  . Colon surgery    . Hernia repair    . Esophagogastroduodenoscopy  02/15/2012    Procedure: ESOPHAGOGASTRODUODENOSCOPY (EGD);  Surgeon: Estanislado Emms., MD,FACG;  Location: [facility] ENDOSCOPY;  Service: Endoscopy;  Laterality: N/A;  . Esophagogastroduodenoscopy N/A 03/22/2013    Procedure: ESOPHAGOGASTRODUODENOSCOPY (EGD);  Surgeon: Jerene Bears, MD;  Location: [place];  Service: Gastroenterology;  Laterality: N/A;   Family History  Problem Relation Age of Onset  . Breast cancer Mother     mother still living and father passed with unsure reason.  . Alzheimer's disease Mother   . Colon cancer Neg Hx   . Esophageal cancer Neg Hx   . Stomach cancer Neg Hx   . Rectal cancer Neg Hx    History  Substance Use Topics  . Smoking status: Never Smoker   . Smokeless tobacco: Never Used  . Alcohol Use: No     Comment: patient used to drink 1.5 bottle of wine plus some liquor daily for 50 + years and quit drinking 4 years ago    Review of Systems  Unable to perform ROS: Mental status change      Allergies  Review of patient's allergies indicates no known allergies.  Home Medications  Prior to Admission medications   Medication Sig Start Date End Date Taking? Authorizing Provider  aspirin EC 81 MG tablet Take 81 mg by mouth daily.    Historical Provider, MD  Blood Glucose Monitoring Suppl (ACCU-CHEK NANO SMARTVIEW) W/DEVICE KIT 1 each by Does not apply route 2 (two) times daily. 08/18/13   Jessee Avers, MD  ferrous sulfate 325 (65 FE) MG tablet Take 325 mg by mouth 2 (two) times daily with a meal.    Historical Provider, MD  folic acid (FOLVITE) 867 MCG tablet Take 1 tablet by mouth 2 (two) times daily. 07/07/10   Historical Provider, MD  glucose blood (ACCU-CHEK SMARTVIEW) test  strip Use as instructed 06/20/12 06/20/13  Jenelle Mages, MD  glucose blood (ACCU-CHEK SMARTVIEW) test strip Check blood sugar up to 3 times daily 11/09/14   Bartholomew Crews, MD  glucose blood test strip Use to check blood sugars daily. ICD code: E11.29. 11/10/14   Arman Filter, MD  insulin glargine (LANTUS) 100 UNIT/ML injection Inject 0.4 mLs (40 Units total) into the skin at bedtime. 10/14/13   Hester Mates, MD  Insulin Syringe-Needle U-100 (ELITE-THIN INS SYR .5CC/31G) 31G X 5/16" 0.5 ML MISC Use 4 times daily to inject insulin. diag code E11.29. Insulin dependent 12/09/14   Arman Filter, MD  Lancets Misc. (ACCU-CHEK FASTCLIX LANCET) KIT Use with lancets to check glucose fasting and with meals daily. 11/10/14   Arman Filter, MD  lisinopril (PRINIVIL,ZESTRIL) 10 MG tablet Take 1 tablet (10 mg total) by mouth daily. 12/09/14   Arman Filter, MD  Multiple Vitamins-Minerals (MULTIVITAMIN PO) Take 1 tablet by mouth daily.    Historical Provider, MD  nadolol (CORGARD) 80 MG tablet Take 1 tablet (80 mg total) by mouth daily. 02/03/14   Hester Mates, MD  omeprazole (PRILOSEC) 40 MG capsule Take 40 mg by mouth daily. 07/07/10   Historical Provider, MD  pantoprazole (PROTONIX) 40 MG tablet Take 1 tablet (40 mg total) by mouth 2 (two) times daily. 12/09/14   Arman Filter, MD  polyethylene glycol powder (GLYCOLAX/MIRALAX) powder Take 17 g by mouth daily. 09/21/10   Historical Provider, MD  propranolol (INDERAL) 10 MG tablet Take 10 mg by mouth 3 (three) times daily. 07/07/10   Historical Provider, MD  zoster vaccine live, PF, (ZOSTAVAX) 61950 UNT/0.65ML injection Inject 19,400 Units into the skin once. 07/07/14   Arman Filter, MD   BP 199/86 mmHg  Pulse 66  Temp(Src) 100.9 F (38.3 C) (Rectal)  Resp 14  Ht $R'5\' 10"'pP$  (1.778 m)  SpO2 100% Physical Exam  Constitutional: He appears well-developed and well-nourished.  HENT:  Head: Normocephalic and atraumatic.  Eyes: Conjunctivae and EOM  are normal.  Neck: Normal range of motion. Neck supple.  Cardiovascular: Normal rate, regular rhythm and normal heart sounds.   Pulmonary/Chest: Effort normal and breath sounds normal. No respiratory distress.  Abdominal: He exhibits no distension.  Musculoskeletal: Normal range of motion.  Neurological: He is unresponsive. GCS eye subscore is 1. GCS verbal subscore is 1. GCS motor subscore is 3.  Roving eye gaze, shaking in all extremities  Skin: Skin is warm and dry.  Vitals reviewed.   ED Course  INTUBATION Date/Time: 12/03/2014 2:41 PM Performed by: Debby Freiberg Authorized by: Debby Freiberg Consent: The procedure was performed in an emergent situation. Indications: airway protection Intubation method: video-assisted Patient status: paralyzed (RSI) Sedatives: etomidate Paralytic: rocuronium Laryngoscope size: Mac 3 Tube size: 7.5 mm Tube type: cuffed Number of attempts: 1 Cords  visualized: yes Post-procedure assessment: chest rise and CO2 detector Breath sounds: equal Cuff inflated: yes ETT to lip: 24 cm Tube secured with: ETT holder Chest x-ray interpreted by me, other physician and radiologist. Chest x-ray findings: endotracheal tube in appropriate position Patient tolerance: Patient tolerated the procedure well with no immediate complications   (including critical care time) Labs Review Labs Reviewed  COMPREHENSIVE METABOLIC PANEL - Abnormal; Notable for the following:    Sodium 133 (*)    CO2 16 (*)    Glucose, Bld 140 (*)    BUN 25 (*)    Albumin 3.0 (*)    AST 94 (*)    Total Bilirubin 2.6 (*)    GFR calc non Af Amer 53 (*)    GFR calc Af Amer 62 (*)    Anion gap 21 (*)    All other components within normal limits  CBC WITH DIFFERENTIAL - Abnormal; Notable for the following:    RBC 3.78 (*)    MCV 103.4 (*)    MCH 34.9 (*)    Platelets 71 (*)    Neutrophils Relative % 81 (*)    Lymphocytes Relative 9 (*)    Lymphs Abs 0.6 (*)    All other  components within normal limits  PRO B NATRIURETIC PEPTIDE - Abnormal; Notable for the following:    Pro B Natriuretic peptide (BNP) 4563.0 (*)    All other components within normal limits  URINALYSIS, ROUTINE W REFLEX MICROSCOPIC - Abnormal; Notable for the following:    Color, Urine AMBER (*)    Hgb urine dipstick SMALL (*)    Ketones, ur 15 (*)    Protein, ur 100 (*)    All other components within normal limits  I-STAT CHEM 8, ED - Abnormal; Notable for the following:    Sodium 133 (*)    BUN 26 (*)    Creatinine, Ser 1.40 (*)    Glucose, Bld 142 (*)    All other components within normal limits  I-STAT CG4 LACTIC ACID, ED - Abnormal; Notable for the following:    Lactic Acid, Venous 6.56 (*)    All other components within normal limits  CBG MONITORING, ED - Abnormal; Notable for the following:    Glucose-Capillary 129 (*)    All other components within normal limits  I-STAT ARTERIAL BLOOD GAS, ED - Abnormal; Notable for the following:    pCO2 arterial 27.8 (*)    pO2, Arterial 103.0 (*)    Bicarbonate 17.0 (*)    Acid-base deficit 6.0 (*)    All other components within normal limits  TROPONIN I  URINE RAPID DRUG SCREEN (HOSP PERFORMED)  ETHANOL  URINE MICROSCOPIC-ADD ON  LACTIC ACID, PLASMA  BLOOD GAS, ARTERIAL  TRIGLYCERIDES    Imaging Review Ct Head Wo Contrast  12/30/2014   CLINICAL DATA:  Patient with seizure. No known seizure history. History of metastatic mouth carcinoma.  EXAM: CT HEAD WITHOUT CONTRAST  TECHNIQUE: Contiguous axial images were obtained from the base of the skull through the vertex without intravenous contrast.  COMPARISON:  None.  FINDINGS: Ventricles and sulci are prominent, compatible with atrophy. Periventricular and subcortical white matter hypodensity compatible with chronic small vessel ischemic change. Age indeterminate left basal ganglia hypodensity. No definite intracranial mass lesion or significant mass effect. Calvarium is intact.  Paranasal sinuses are well aerated. Unchanged deformity of the left maxillary sinus. Mastoid air cells are well aerated. Small amount of dependent fluid within the posterior nasopharynx.  IMPRESSION:  Age-indeterminate left basal ganglia hypodensity may represent age-indeterminate left basal ganglia infarct.  Non contrast-enhanced examination is limited for the detection of intracranial metastatic disease. Consider further evaluation with contrast-enhanced MRI.   Electronically Signed   By: Lovey Newcomer M.D.   On: 12/10/2014 14:03   Dg Chest Portable 1 View  12/09/2014   CLINICAL DATA:  Unresponsive check endotracheal tube and nasogastric catheter placement  EXAM: PORTABLE CHEST - 1 VIEW  COMPARISON:  09/28/2011  FINDINGS: Endotracheal tube is noted approximately 2.8 cm above the carina. A nasogastric catheter is noted within the stomach. The cardiac shadow is within normal limits. The lungs are well aerated bilaterally. No focal infiltrate is seen. Persistent left effusion is noted posteriorly.  IMPRESSION: Tubes and lines as described.  Persistent left-sided effusion.  No acute abnormality is noted.   Electronically Signed   By: Inez Catalina M.D.   On: 12/25/2014 13:35     EKG Interpretation None     CRITICAL CARE Performed by: Debby Freiberg   Total critical care time: 35 min  Critical care time was exclusive of separately billable procedures and treating other patients.  Critical care was necessary to treat or prevent imminent or life-threatening deterioration.  Critical care was time spent personally by me on the following activities: development of treatment plan with patient and/or surrogate as well as nursing, discussions with consultants, evaluation of patient's response to treatment, examination of patient, obtaining history from patient or surrogate, ordering and performing treatments and interventions, ordering and review of laboratory studies, ordering and review of radiographic  studies, pulse oximetry and re-evaluation of patient's condition.  MDM   Final diagnoses:  Unresponsive  Status epilepticus  Seizure    73 y.o. male with pertinent PMH of colon, tonsillar, prostate ca with recent concern for peritoneal carcinomatosis, DM presents with altered mental status and concern for seizures as above. The patient has been seeing his oncologist, has had some intermittent visual complaints over the last few weeks. This has not been worked up yet.  Patient had a roughly 32nd episode of generalized tonic-clonic movements which were subtle, nonresponsive to 1 mg of Ativan here. The patient was given 2 mg of Narcan without response. Glucose normal. Given that the patient had been unresponsive for some time, GCS 5, airway secured as above. Consulted neurology for status epilepticus.  Keppra loaded.  Consulted critical care for admission.   I have reviewed all laboratory and imaging studies if ordered as above  1. Status epilepticus   2. Unresponsive   3. Seizure         Debby Freiberg, MD 12/05/2014 [phone]

## 2014-12-14 NOTE — ED Notes (Signed)
Critical care aware of bp.

## 2014-12-14 NOTE — ED Notes (Signed)
Xray called

## 2014-12-14 NOTE — ED Notes (Signed)
Neurology at bedside, and attempted report to [facility]

## 2014-12-14 NOTE — ED Notes (Signed)
Pt undressed, in gown, on monitor, continuous pulse oximetry, blood pressure cuff and oxygen [place] (4L) 

## 2014-12-14 NOTE — ED Notes (Signed)
Pt returned from ct and placed back on monitor, remains intubated.

## 2014-12-14 NOTE — ED Notes (Signed)
CBG 129 

## 2014-12-14 NOTE — ED Notes (Signed)
NOTIFIED H. RINGLEY ,RN FOR PATIENTS PANIC LAB RESULTS OF CG4+ LACTIC ACID = 6.56 mmol/L @13 :00 PM ,12/21/2014.

## 2014-12-14 NOTE — Consult Note (Signed)
Patient VO:[id number] Eddie [name]      DOB: 08-18-1941      VQQ:[id number]     Consult Note from the Palliative Medicine Team at [place] Requested by: Kerney Elbe NP    PCP: Arman Filter, MD Reason for Consultation: Goals of Care    Phone Number:[phone]  Assessment/Recommendations: 73 yo male with PMHx of several malignancies which were not being actively treated. Recently noted to have CT findings converning for peritoneal carcinomatosis.  Presented today with episode of altered mental status/unresponsive at home with ? Of seizure activity.    1.  Code Status: Full  2. Goals of Care: Spoke briefly with sister via phone today. She estimates getting here after 8PM tonight.  She did hear from Dr Earlie Server the other day when Eddie [name] was in Oncology clinic but she was very unsure about seriousness of his outpatient scans (with concern for peritoneal carcinomatosis).  She admits to feeling "frazzled" at the moment and not sure what to think. We talked about how serious this finding is and further work-up plans with MRI here.  Our team will follow-up with her tomorrow.  While it sounds like his functional baseline was well enough to live independently, being cirrhotic with likely peritoneal carcinomatosis and possible brain mets pertains to a poor prognosis.     3. Symptom Management:   1. Acute Encephalopathy- in setting of possible seizure, ? Brain mets.  MRI pending. Will fu tomorrow. ICU protocol for sedation.    4. Psychosocial/Spiritual: Lives in [place]. Family lives in [place].    Brief HPI: 73 yo male with PMHx of SCC of H+N, prostate CA, colon CA s/p hemicolectomy, cirrhosis presumed 2/2 Hep C/Etoh.  He presented to ED from home after he was found to be altered and unresponsive at home.  Also some question of seizure like activity.  Recently followed wit Dr Earlie Server of Oncology as had CT scan revealing concern for peritoneal carcinomatosis.  I am not sure why  exactly these scans were done.  His previous malignancy was not felt to be an active issue and he was under care via routine surveillance.  Reportedly was feeling "fine" last week when he went for Oncology appointment. He was intubated in ED and unable to provide any history.  Notable lactic acidosis on admission with resp compensation.   I spoke with his sister (Dr Sherral Hammers) today.  She is on her way down from [place] and they will be here around [place].  She comes down to check on him about once per month but Eddie [name] has been living independently prior to this.  Reportedly only thing out of the ordinary prior to this event was decreased PO intake over weekend that he reported to sister.  She followed up with him yesterday where he told her he was "doing great".  She called again Monday night and he did not answer which is unusual for him. No answer again today which lead to sister calling neighbors to check on him and above uncovered.     ROS: Unable to obtain with Intubation/Encephalopathy    PMH:  Past Medical History  Diagnosis Date  . Cancer     Status post curative radiotherapy with IMRT and androgen ablation for the prostate adenocarcinoma, completed December 10, 2008.  Marland Kitchen Cancer of colon     Status post left hemicolectomy at [facility] in November of 2010 revealing T3 N0 M0 colon adenocarcinoma and the patient did not receive adjuvant  chemotherapy  . 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  Marland Kitchen Upper GI bleeding     EGD on 01/2011 shows esophagitis, grade 1 varices  . Hypertension   . Cirrhosis   . Anemia     bone marrow biopsy 11/2011,   . Diabetes mellitus   . Hepatitis C   . Gallstone   . Thrush, oral   . GERD (gastroesophageal reflux disease)   . DM gastroparesis   . Gastritis   . Hernia   . Pancreatitis   . Pancytopenia   . Cataract   .  Gastric varices   . Colon polyp   . Diverticulosis      PSH: Past Surgical History  Procedure Laterality Date  . Colon surgery    . Hernia repair    . Esophagogastroduodenoscopy  02/15/2012    Procedure: ESOPHAGOGASTRODUODENOSCOPY (EGD);  Surgeon: Estanislado Emms., MD,FACG;  Location: [facility] ENDOSCOPY;  Service: Endoscopy;  Laterality: N/A;  . Esophagogastroduodenoscopy N/A 03/22/2013    Procedure: ESOPHAGOGASTRODUODENOSCOPY (EGD);  Surgeon: Jerene Bears, MD;  Location: [place];  Service: Gastroenterology;  Laterality: N/A;   I have reviewed the [place] and SH and  If appropriate update it with new information. No Known Allergies Scheduled Meds: . antiseptic oral rinse  7 mL Mouth Rinse QID  . chlorhexidine  15 mL Mouth Rinse BID  . heparin subcutaneous  5,000 Units Subcutaneous 3 times per day  . levETIRAcetam  500 mg Intravenous Q12H  . lidocaine (cardiac) 100 mg/64ml      . LORazepam      . pantoprazole sodium  40 mg Per Tube Q1200  . succinylcholine       Continuous Infusions: . propofol 10 mcg/kg/min (12/28/2014 1456)   PRN Meds:.sodium chloride, fentaNYL    BP 189/87 mmHg  Pulse 76  Temp(Src) 98.3 F (36.8 C) (Oral)  Resp 14  Ht $R'5\' 10"'Ds$  (1.778 m)  Wt 82.8 kg (182 lb 8.7 oz)  BMI 26.19 kg/m2  SpO2 98%   PPS: 10  No intake or output data in the 24 hours ending 11/30/2014 1649  Physical Exam:  General: Intubated HEENT: [place], sclera anicteric Chest:   CTAB CVS: RRR Abdomen: soft, ND Ext: edematous Neuro: minimally responsive post intubation  Labs: CBC    Component Value Date/Time   WBC 6.6 12/23/2014 1301   WBC 3.2* 12/06/2014 1320   RBC 3.78* 12/22/2014 1301   RBC 3.51* 12/06/2014 1320   RBC 3.05* 02/16/2012 0918   HGB 13.2 12/13/2014 1301   HGB 12.3* 12/06/2014 1320   HCT 39.1 12/10/2014 1301   HCT 36.6* 12/06/2014 1320   PLT 71* 12/19/2014 1301   PLT 53* 12/06/2014 1320   MCV 103.4* 12/15/2014 1301   MCV 104.3* 12/06/2014 1320   MCH 34.9*  12/09/2014 1301   MCH 35.0* 12/06/2014 1320   MCHC 33.8 12/10/2014 1301   MCHC 33.6 12/06/2014 1320   RDW 14.9 12/15/2014 1301   RDW 15.7* 12/06/2014 1320   LYMPHSABS 0.6* 12/01/2014 1301   LYMPHSABS 0.8* 12/06/2014 1320   MONOABS 0.6 12/02/2014 1301   MONOABS 0.4 12/06/2014 1320   EOSABS 0.0 12/07/2014 1301   EOSABS 0.2 12/06/2014 1320   BASOSABS 0.0 12/22/2014 1301   BASOSABS 0.0 12/06/2014 1320    BMET    Component Value Date/Time   NA 133* 12/07/2014 1301   NA 138 12/06/2014 1322  NA 140 05/23/2012 0925   K 5.2 12/19/2014 1301   K 4.8 12/06/2014 1322   K 5.1* 05/23/2012 0925   CL 96 12/03/2014 1301   CL 101 11/24/2012 1427   CL 97* 05/23/2012 0925   CO2 16* 12/03/2014 1301   CO2 22 12/06/2014 1322   CO2 31 05/23/2012 0925   GLUCOSE 140* 12/13/2014 1301   GLUCOSE 99 12/06/2014 1322   GLUCOSE 174* 11/24/2012 1427   GLUCOSE 252* 05/23/2012 0925   BUN 25* 12/16/2014 1301   BUN 10.6 12/06/2014 1322   BUN 11 05/23/2012 0925   CREATININE 1.31 12/19/2014 1301   CREATININE 1.2 12/06/2014 1322   CREATININE 1.3* 05/23/2012 0925   CALCIUM 9.2 12/08/2014 1301   CALCIUM 9.2 12/06/2014 1322   CALCIUM 9.4 05/23/2012 0925   GFRNONAA 53* 12/24/2014 1301   GFRNONAA 64 02/14/2012 1155   GFRAA 62* 12/22/2014 1301   GFRAA 74 02/14/2012 1155    CMP     Component Value Date/Time   NA 133* 12/11/2014 1301   NA 138 12/06/2014 1322   NA 140 05/23/2012 0925   K 5.2 12/27/2014 1301   K 4.8 12/06/2014 1322   K 5.1* 05/23/2012 0925   CL 96 12/10/2014 1301   CL 101 11/24/2012 1427   CL 97* 05/23/2012 0925   CO2 16* 12/01/2014 1301   CO2 22 12/06/2014 1322   CO2 31 05/23/2012 0925   GLUCOSE 140* 12/02/2014 1301   GLUCOSE 99 12/06/2014 1322   GLUCOSE 174* 11/24/2012 1427   GLUCOSE 252* 05/23/2012 0925   BUN 25* 12/19/2014 1301   BUN 10.6 12/06/2014 1322   BUN 11 05/23/2012 0925   CREATININE 1.31 12/01/2014 1301   CREATININE 1.2 12/06/2014 1322   CREATININE 1.3* 05/23/2012  0925   CALCIUM 9.2 12/08/2014 1301   CALCIUM 9.2 12/06/2014 1322   CALCIUM 9.4 05/23/2012 0925   PROT 6.9 12/17/2014 1301   PROT 6.7 12/06/2014 1322   PROT 8.3* 05/23/2012 0925   ALBUMIN 3.0* 12/24/2014 1301   ALBUMIN 3.0* 12/06/2014 1322   AST 94* 12/13/2014 1301   AST 62* 12/06/2014 1322   AST 51* 05/23/2012 0925   ALT 39 12/15/2014 1301   ALT 36 12/06/2014 1322   ALT 40 05/23/2012 0925   ALKPHOS 53 12/24/2014 1301   ALKPHOS 79 12/06/2014 1322   ALKPHOS 101* 05/23/2012 0925   BILITOT 2.6* 12/05/2014 1301   BILITOT 1.47* 12/06/2014 1322   BILITOT 1.20 05/23/2012 0925   GFRNONAA 53* 12/27/2014 1301   GFRNONAA 64 02/14/2012 1155   GFRAA 62* 12/10/2014 1301   GFRAA 74 02/14/2012 1155   12/06/14 CT Neck IMPRESSION: No evidence of recurrent disease in the neck.  12/06/14 CT Chest Abd/Pelvis IMPRESSION: 1. Progressive ascites with nodularity throughout the omentum and mesenteric fat highly worrisome for peritoneal carcinomatosis. Tumor likely extends through the esophageal hiatus into the lower chest. 2. Progressive changes of cirrhosis and portal hypertension. 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 3. Diffuse esophageal wall thickening is similar to the prior examination and likely related to esophagitis and/or liver disease. Likewise, gastric and colonic wall thickening is noted. Ventral hernias containing small bowel loops and fluid are again noted. There is no evidence of bowel incarceration or obstruction. 4. Stable pancreatic atrophy and calcifications. 5. New moderate size left pleural effusion without definite malignant features.  12/22/2014 CT Head IMPRESSION: Age-indeterminate left basal ganglia hypodensity may represent age-indeterminate left basal ganglia infarct.  Non contrast-enhanced examination is limited for  the detection of intracranial metastatic disease. Consider further evaluation with contrast-enhanced  MRI.   Total Time: 45 minutes  Greater than 50%  of this time was spent counseling and coordinating care related to the above assessment and plan.   Doran Clay D.O. Palliative Medicine Team at [facility]  Pager: [phone] Team Phone: [phone]

## 2014-12-14 NOTE — H&P (Signed)
PULMONARY / CRITICAL CARE MEDICINE   Name: Eddie [name] MRN: [id number] DOB: [birth year]    ADMISSION DATE:  12/15/2014 CONSULTATION DATE:  12/09/2014  REFERRING MD :  Debby Freiberg MD  CHIEF COMPLAINT:  Seizure/ found down  INITIAL PRESENTATION: Eddie [name] was found unresponsive by family friends this morning while at home around 11:30.  He was last seen normal yesterday at 1:30pm.  They tried calling last night without answer.  EMS was called and patient had a witnessed seizure while en-route, at [facility] he had 2 more witnessed seizures (tonic clonic).  His sister was contacted by EDP who reports he was a full code and wanted everything done, he was intubated in the ED.  STUDIES:  CT head 12/15>> Age- indeterminate left basal ganglia hypodensity.   SIGNIFICANT EVENTS: 12/30/2014>> 1 seizure en route, 2 seizures in ED.   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  In addition he has a history of cirrhosis, GIB, pancytopenia, mesenteric vein thrombosis, T2DM.  He most recently saw his oncologist 12/07/14 and was noted to be having visual hallucinations at that time, an MRI of the brain was ordered but has yet to be completed.  PAST MEDICAL HISTORY :   has a past medical history of Cancer; Cancer of colon; Cancer; Upper GI bleeding; Hypertension; Cirrhosis; Anemia; Diabetes mellitus; Hepatitis C; Gallstone; Thrush, oral; GERD (gastroesophageal reflux disease); DM gastroparesis; Gastritis; Hernia; Pancreatitis; Pancytopenia; Cataract; Gastric varices; Colon polyp; and Diverticulosis.  has  past surgical history that includes Colon surgery; Hernia repair; Esophagogastroduodenoscopy (02/15/2012); and Esophagogastroduodenoscopy (N/A, 03/22/2013). Prior to Admission medications   Medication Sig Start Date End Date Taking? Authorizing Provider  aspirin EC 81 MG tablet Take 81 mg by mouth daily.    Historical Provider, MD  Blood Glucose Monitoring Suppl (ACCU-CHEK NANO SMARTVIEW) W/DEVICE KIT 1 each by Does not apply route 2 (two) times daily. 08/18/13   Jessee Avers, MD  ferrous sulfate 325 (65 FE) MG tablet Take 325 mg by mouth 2 (two) times daily with a meal.    Historical Provider, MD  folic acid (FOLVITE) 025 MCG tablet Take 1 tablet by mouth 2 (two) times daily. 07/07/10   Historical Provider, MD  glucose blood (ACCU-CHEK SMARTVIEW) test strip Use as instructed 06/20/12 06/20/13  Jenelle Mages, MD  glucose blood (ACCU-CHEK SMARTVIEW) test strip Check blood sugar up to 3 times daily 11/09/14   Bartholomew Crews, MD  glucose blood test strip Use to check blood sugars daily. ICD code: E11.29. 11/10/14   Arman Filter, MD  insulin glargine (LANTUS) 100 UNIT/ML injection Inject 0.4 mLs (40 Units total) into the skin at bedtime. 10/14/13   Hester Mates, MD  Insulin Syringe-Needle U-100 (ELITE-THIN INS SYR .5CC/31G) 31G X 5/16" 0.5 ML MISC Use 4 times daily to inject insulin. diag code E11.29. Insulin dependent 12/09/14   Arman Filter, MD  Lancets Misc. (ACCU-CHEK FASTCLIX LANCET) KIT Use with lancets to check glucose fasting and with meals daily. 11/10/14   Arman Filter, MD  lisinopril (PRINIVIL,ZESTRIL) 10 MG tablet Take 1 tablet (10 mg total) by mouth daily. 12/09/14   Arman Filter, MD  Multiple Vitamins-Minerals (MULTIVITAMIN PO)  Take 1 tablet by mouth daily.    Historical Provider, MD  nadolol (CORGARD) 80 MG tablet Take 1 tablet (80 mg total) by mouth daily. 02/03/14   Hester Mates, MD  omeprazole (PRILOSEC) 40 MG capsule Take 40 mg by mouth daily. 07/07/10   Historical  Provider, MD  pantoprazole (PROTONIX) 40 MG tablet Take 1 tablet (40 mg total) by mouth 2 (two) times daily. 12/09/14   Arman Filter, MD  polyethylene glycol powder (GLYCOLAX/MIRALAX) powder Take 17 g by mouth daily. 09/21/10   Historical Provider, MD  propranolol (INDERAL) 10 MG tablet Take 10 mg by mouth 3 (three) times daily. 07/07/10   Historical Provider, MD  zoster vaccine live, PF, (ZOSTAVAX) 08657 UNT/0.65ML injection Inject 19,400 Units into the skin once. 07/07/14   Arman Filter, MD   No Known Allergies  FAMILY HISTORY:  indicated that his mother is deceased. He indicated that his father is deceased. He indicated that his sister is alive.  SOCIAL HISTORY:  reports that he has never smoked. He has never used smokeless tobacco. He reports that he does not drink alcohol or use illicit drugs.  REVIEW OF SYSTEMS:  Unable to obtain.  SUBJECTIVE:     VITAL SIGNS: Temp:  [100.9 F (38.3 C)] 100.9 F (38.3 C) (12/15 1235) Pulse Rate:  [67-85] 67 (12/15 1430) Resp:  [14-22] 14 (12/15 1430) BP: (147-204)/(76-112) 202/91 mmHg (12/15 1430) SpO2:  [99 %-100 %] 100 % (12/15 1430) FiO2 (%):  [30 %] 30 % (12/15 1254) HEMODYNAMICS:   VENTILATOR SETTINGS: Vent Mode:  [-] PRVC FiO2 (%):  [30 %] 30 % Set Rate:  [14 bmp] 14 bmp Vt Set:  [550 mL] 550 mL Plateau Pressure:  [13 cmH20] 13 cmH20 INTAKE / OUTPUT: No intake or output data in the 24 hours ending 12/11/2014 1444  PHYSICAL EXAMINATION: General:  Patient sedated and has been given neuro-muscular blockade Neuro:  Unresponsive to verbal or painful stimuli, flaccid extremities. HEENT:  Pinpoint pupils bilaterally, reactive Cardiovascular:  RRR no murmur appreciated Lungs:  Decreased over left base Abdomen:  soft Musculoskeletal:  2+ lower extremity edema Skin:  Warm/ grossly intact, small skin break at intergluteal cleft  LABS:  CBC  Recent Labs Lab 12/15/2014 1253 12/29/2014 1301  WBC  --  6.6  HGB 15.0 13.2  HCT 44.0 39.1   PLT  --  71*   Coag's No results for input(s): APTT, INR in the last 168 hours. BMET  Recent Labs Lab 12/15/2014 1253 12/16/2014 1301  NA 133* 133*  K 5.0 5.2  CL 102 96  CO2  --  16*  BUN 26* 25*  CREATININE 1.40* 1.31  GLUCOSE 142* 140*   Electrolytes  Recent Labs Lab 12/11/2014 1301  CALCIUM 9.2   Sepsis Markers  Recent Labs Lab 12/16/2014 1253  LATICACIDVEN 6.56*   ABG  Recent Labs Lab 12/25/2014 1407  PHART 7.396  PCO2ART 27.8*  PO2ART 103.0*   Liver Enzymes  Recent Labs Lab 12/23/2014 1301  AST 94*  ALT 39  ALKPHOS 53  BILITOT 2.6*  ALBUMIN 3.0*   Cardiac Enzymes  Recent Labs Lab 12/16/2014 1301  TROPONINI <0.30  PROBNP 4563.0*   Glucose  Recent Labs Lab 12/02/2014 1240  GLUCAP 129*    Imaging No results found.   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 Keppra Load given in ED Continue Keppra 551m BID Neurology consulted EEG STAT MRI   FAMILY  - Updates: [facility] EDP[facility]  - Inter-disciplinary family meet or Palliative  Care meeting due by:  12/21/14    TODAY'S SUMMARY: 73year old male with multiple malignancies presenting with seizure concerning for metastasis, will enlist assistance of palliative care and heme/onc for goals of care, obtain MRI of head, EEG.     ELucious Groves DO  Pulmonary and Critical Care Medicine LCoquille Valley Hospital DistrictPager: [phone] 11/30/2014, 2:44 PM   PCCM ATTENDING: Pt seen on work rounds with care provider noted above. We reviewed pt's initial presentation, consultants notes and hospital database in detail.  The above assessment and plan was formulated under my direction.  In summary: Multiple malignancies with evidence of metastases Found unresponsive by family members Witnessed seizures en route and in ED Intubated for AMS  Unresponsive on exam Brainstem reflexes persist Chest clear  Reg rhythm, no murmurs Abdomen soft, no palpable masses Severe symmetric LE edema  Labs and Xrays reviewed including CT head  Overall prognosis for meaningful survival is abysmal There are no family members available to discuss care limitations [place]  Will support on vent Anticonvulsants including propofol SUP and DVT px Will need to discuss code status and advanced directives with sister after her arrival Palliative Care consult requested I would favor full comfort and discontinuation of vent support   35 minutes of independent CCM time was provided by me   DMerton Border MD;  PCCM service; Mobile [phone]

## 2014-12-14 NOTE — Progress Notes (Signed)
STAT EEG completed, results pending  

## 2014-12-15 ENCOUNTER — Other Ambulatory Visit: Payer: Medicare Other

## 2014-12-15 ENCOUNTER — Inpatient Hospital Stay (HOSPITAL_COMMUNITY): Payer: Medicare Other

## 2014-12-15 DIAGNOSIS — Z66 Do not resuscitate: Secondary | ICD-10-CM

## 2014-12-15 DIAGNOSIS — G9340 Encephalopathy, unspecified: Secondary | ICD-10-CM

## 2014-12-15 DIAGNOSIS — C799 Secondary malignant neoplasm of unspecified site: Secondary | ICD-10-CM | POA: Insufficient documentation

## 2014-12-15 DIAGNOSIS — Z515 Encounter for palliative care: Secondary | ICD-10-CM

## 2014-12-15 LAB — BASIC METABOLIC PANEL
Anion gap: 12 (ref 5–15)
BUN: 31 mg/dL — AB (ref 6–23)
CALCIUM: 8.2 mg/dL — AB (ref 8.4–10.5)
CO2: 20 meq/L (ref 19–32)
CREATININE: 1.26 mg/dL (ref 0.50–1.35)
Chloride: 104 mEq/L (ref 96–112)
GFR calc Af Amer: 65 mL/min — ABNORMAL LOW (ref >90)
GFR calc non Af Amer: 56 mL/min — ABNORMAL LOW (ref >90)
Glucose, Bld: 124 mg/dL — ABNORMAL HIGH (ref 70–99)
Potassium: 4.2 mEq/L (ref 3.7–5.3)
Sodium: 136 mEq/L — ABNORMAL LOW (ref 137–147)

## 2014-12-15 LAB — BLOOD GAS, ARTERIAL
ACID-BASE DEFICIT: 2.4 mmol/L — AB (ref 0.0–2.0)
Bicarbonate: 20.4 mEq/L (ref 20.0–24.0)
DRAWN BY: 418751
FIO2: 0.3 %
O2 Saturation: 98.5 %
PEEP: 5 cmH2O
PH ART: 7.492 — AB (ref 7.350–7.450)
Patient temperature: 98.6
RATE: 14 resp/min
TCO2: 21.2 mmol/L (ref 0–100)
VT: 580 mL
pCO2 arterial: 26.9 mmHg — ABNORMAL LOW (ref 35.0–45.0)
pO2, Arterial: 117 mmHg — ABNORMAL HIGH (ref 80.0–100.0)

## 2014-12-15 LAB — CBC
HCT: 31 % — ABNORMAL LOW (ref 39.0–52.0)
HEMOGLOBIN: 10.7 g/dL — AB (ref 13.0–17.0)
MCH: 35.4 pg — AB (ref 26.0–34.0)
MCHC: 34.5 g/dL (ref 30.0–36.0)
MCV: 102.6 fL — AB (ref 78.0–100.0)
Platelets: 42 10*3/uL — ABNORMAL LOW (ref 150–400)
RBC: 3.02 MIL/uL — AB (ref 4.22–5.81)
RDW: 14.6 % (ref 11.5–15.5)
WBC: 5.6 10*3/uL (ref 4.0–10.5)

## 2014-12-15 LAB — GLUCOSE, CAPILLARY
GLUCOSE-CAPILLARY: 133 mg/dL — AB (ref 70–99)
GLUCOSE-CAPILLARY: 94 mg/dL (ref 70–99)
GLUCOSE-CAPILLARY: 95 mg/dL (ref 70–99)
GLUCOSE-CAPILLARY: 82 mg/dL (ref 70–99)
Glucose-Capillary: 136 mg/dL — ABNORMAL HIGH (ref 70–99)
Glucose-Capillary: 79 mg/dL (ref 70–99)

## 2014-12-15 MED ORDER — SODIUM CHLORIDE 0.9 % IV SOLN
INTRAVENOUS | Status: DC
  Administered 2014-12-15: 16:00:00 via INTRAVENOUS

## 2014-12-15 NOTE — Progress Notes (Signed)
Nutrition Brief Note  Chart reviewed. Patient transitioning to full comfort care and being extubated on 12/17 when family arrives. No nutrition interventions warranted at this time. Please consult as needed.   Molli Barrows, RD, LDN, [place] Pager [phone] After Hours Pager [phone]

## 2014-12-15 NOTE — Progress Notes (Signed)
Subjective: Patient remains intubated and sedated.  No further seizure activity noted.    Objective: Current vital signs: BP 104/56 mmHg  Pulse 64  Temp(Src) 98.2 F (36.8 C) (Oral)  Resp 14  Ht 5\' 10"  (1.778 m)  Wt 85 kg (187 lb 6.3 oz)  BMI 26.89 kg/m2  SpO2 100% Vital signs in last 24 hours: Temp:  [98.2 F (36.8 C)-100.9 F (38.3 C)] 98.2 F (36.8 C) (12/16 0800) Pulse Rate:  [63-85] 64 (12/16 0700) Resp:  [14-22] 14 (12/16 0700) BP: (93-204)/(50-112) 104/56 mmHg (12/16 0700) SpO2:  [98 %-100 %] 100 % (12/16 0700) FiO2 (%):  [30 %] 30 % (12/16 0800) Weight:  [82.8 kg (182 lb 8.7 oz)-85 kg (187 lb 6.3 oz)] 85 kg (187 lb 6.3 oz) (12/16 0445)  Intake/Output from previous day: 12/15 0701 - 12/16 0700 In: 430.3 [I.V.:210.3; NG/GT:20; IV Piggyback:200] Out: 950 [Urine:950] Intake/Output this shift: Total I/O In: 40 [I.V.:10; NG/GT:30] Out: 160 [Urine:60; Emesis/NG output:100] Nutritional status: Diet NPO time specified  Neurologic Exam: Mental Status: Patient does not respond to verbal stimuli.  Grimaces some with painful stimuli.  Does not follow commands.  No verbalizations noted.  Cranial Nerves: II: patient does not respond confrontation bilaterally, pupils right 3 mm, left 3 mm,and reactive bilaterally III,IV,VI: doll's response absent bilaterally.  V,VII: corneal reflex reduced bilaterally  VIII: patient does not respond to verbal stimuli IX,X: gag reflex reduced, XI: trapezius strength unable to test bilaterally XII: tongue strength unable to test Motor: Extremities flaccid throughout.  No spontaneous movement noted.  Does move legs with external painful stimuli Sensory: Grimaces to painful stimuli in the upper extremities and withdraws to painful stimuli in the lower extremities bilaterally, no focality noted Deep Tendon Reflexes:  2+ in the upper extremities and absent in the lower extremities Plantars: mute bilaterally Cerebellar: Unable to  perform    Lab Results: Basic Metabolic Panel:  Recent Labs Lab 12/16/2014 1253 12/24/2014 1301 12/15/14 0440  NA 133* 133* 136*  K 5.0 5.2 4.2  CL 102 96 104  CO2  --  16* 20  GLUCOSE 142* 140* 124*  BUN 26* 25* 31*  CREATININE 1.40* 1.31 1.26  CALCIUM  --  9.2 8.2*    Liver Function Tests:  Recent Labs Lab 12/26/2014 1301  AST 94*  ALT 39  ALKPHOS 53  BILITOT 2.6*  PROT 6.9  ALBUMIN 3.0*   No results for input(s): LIPASE, AMYLASE in the last 168 hours. No results for input(s): AMMONIA in the last 168 hours.  CBC:  Recent Labs Lab 12/15/2014 1253 12/05/2014 1301 12/15/14 0440  WBC  --  6.6 5.6  NEUTROABS  --  5.4  --   HGB 15.0 13.2 10.7*  HCT 44.0 39.1 31.0*  MCV  --  103.4* 102.6*  PLT  --  71* 42*    Cardiac Enzymes:  Recent Labs Lab 12/30/2014 1301  TROPONINI <0.30    Lipid Panel:  Recent Labs Lab 12/09/2014 1410  TRIG 135    CBG:  Recent Labs Lab 12/05/2014 1605 12/26/2014 1920 12/15/14 0036 12/15/14 0425 12/15/14 0821  GLUCAP 181* 196* 136* 133* 65    Microbiology: Results for orders placed or performed during the hospital encounter of 12/10/2014  MRSA PCR Screening     Status: None   Collection Time: 12/13/2014  3:38 PM  Result Value Ref Range Status   MRSA by PCR NEGATIVE NEGATIVE Final    Comment:        The GeneXpert  MRSA Assay (FDA approved for NASAL specimens only), is one component of a comprehensive MRSA colonization surveillance program. It is not intended to diagnose MRSA infection nor to guide or monitor treatment for MRSA infections.     Coagulation Studies: No results for input(s): LABPROT, INR in the last 72 hours.  Imaging: Ct Head Wo Contrast  12/19/2014   CLINICAL DATA:  Patient with seizure. No known seizure history. History of metastatic mouth carcinoma.  EXAM: CT HEAD WITHOUT CONTRAST  TECHNIQUE: Contiguous axial images were obtained from the base of the skull through the vertex without intravenous contrast.   COMPARISON:  None.  FINDINGS: Ventricles and sulci are prominent, compatible with atrophy. Periventricular and subcortical white matter hypodensity compatible with chronic small vessel ischemic change. Age indeterminate left basal ganglia hypodensity. No definite intracranial mass lesion or significant mass effect. Calvarium is intact. Paranasal sinuses are well aerated. Unchanged deformity of the left maxillary sinus. Mastoid air cells are well aerated. Small amount of dependent fluid within the posterior nasopharynx.  IMPRESSION: Age-indeterminate left basal ganglia hypodensity may represent age-indeterminate left basal ganglia infarct.  Non contrast-enhanced examination is limited for the detection of intracranial metastatic disease. Consider further evaluation with contrast-enhanced MRI.   Electronically Signed   By: Lovey Newcomer M.D.   On: 12/10/2014 14:03   Mr Jeri Cos YI Contrast  12/27/2014   CLINICAL DATA:  Initial evaluation for acute seizure.  EXAM: MRI HEAD WITHOUT AND WITH CONTRAST  TECHNIQUE: Multiplanar, multiecho pulse sequences of the brain and surrounding structures were obtained without and with intravenous contrast.  CONTRAST:  59mL MULTIHANCE GADOBENATE DIMEGLUMINE 529 MG/ML IV SOLN  COMPARISON:  Prior CT from earlier the same day.  FINDINGS: Diffuse prominence of the CSF containing spaces is compatible with generalized age-related cerebral atrophy. Patchy and confluent T2/FLAIR hyperintensity within the periventricular and deep white matter both cerebral hemispheres most consistent with chronic small vessel ischemic changes, mild for patient age. Hippocampi are symmetric in size and appearance with normal signal intensity bilaterally.  No abnormal foci of restricted diffusion to suggest acute intracranial infarct. Gray-white matter differentiation maintained. Normal intravascular flow voids present. No intracranial hemorrhage.  No mass lesion, midline shift, or mass effect. No abnormal  enhancement on post-contrast sequences. Ventricular prominence related to global parenchymal volume loss present without hydrocephalus. No extra-axial fluid collection.  Craniocervical junction within normal limits. Scattered degenerative changes present within the upper cervical spine.  Pituitary gland normal.  No acute abnormality seen about the orbits.  Scattered mucoperiosteal thickening present within the ethmoidal air cells bilaterally, right greater than left. Layering fluid signal intensity within the posterior nasopharynx noted, likely related to intubation. No mastoid effusion.  IMPRESSION: 1. No acute intracranial process identified. No abnormal enhancement or intracranial mass lesion. 2. Generalized cerebral atrophy with mild chronic small vessel ischemic disease.   Electronically Signed   By: Jeannine Boga M.D.   On: 12/16/2014 23:32   Dg Chest Port 1 View  12/15/2014   CLINICAL DATA:  73 year old male with respiratory failure. Acute seizure. Initial encounter.  EXAM: PORTABLE CHEST - 1 VIEW  COMPARISON:  12/25/2014 and earlier.  FINDINGS: Portable AP semi upright view at 0510 hrs. Stable endotracheal tube tip between the clavicles and carina. Stable left IJ central line. Stable enteric tube, side hole projects at the proximal stomach.  Continued dense retrocardiac opacity. Increased veiling opacity on the left with obscuration of the left hemidiaphragm. New patchy and confluent right medial lung base opacity. No pneumothorax or  pulmonary edema.  IMPRESSION: 1.  Stable lines and tubes. 2. Worsening bibasilar ventilation: Left side pleural effusion and lower lobe collapse or consolidation appears increased, While on the right there is new confluent opacity suspicious for acute pneumonia.   Electronically Signed   By: Lars Pinks M.D.   On: 12/15/2014 07:50   Dg Chest Port 1 View  12/18/2014   CLINICAL DATA:  Central line placement  EXAM: PORTABLE CHEST - 1 VIEW  COMPARISON:  12/11/2014   FINDINGS: Cardiomediastinal silhouette is stable. Endotracheal tube in place with tip 3.5 cm above the carina. There is a left central line with tip in SVC right atrium junction. No visible pneumothorax. Please note a lung apices are not included on the current. Stable NG tube position  IMPRESSION: Left central line in place. Stable endotracheal and NG tube position. No visible pneumothorax in included lungs. The lung apices are not included.   Electronically Signed   By: Lahoma Crocker M.D.   On: 12/19/2014 16:33   Dg Chest Portable 1 View  12/04/2014   CLINICAL DATA:  Unresponsive check endotracheal tube and nasogastric catheter placement  EXAM: PORTABLE CHEST - 1 VIEW  COMPARISON:  09/28/2011  FINDINGS: Endotracheal tube is noted approximately 2.8 cm above the carina. A nasogastric catheter is noted within the stomach. The cardiac shadow is within normal limits. The lungs are well aerated bilaterally. No focal infiltrate is seen. Persistent left effusion is noted posteriorly.  IMPRESSION: Tubes and lines as described.  Persistent left-sided effusion.  No acute abnormality is noted.   Electronically Signed   By: Inez Catalina M.D.   On: 12/29/2014 13:35    Medications:  I have reviewed the patient's current medications. Scheduled: . antiseptic oral rinse  7 mL Mouth Rinse QID  . chlorhexidine  15 mL Mouth Rinse BID  . heparin subcutaneous  5,000 Units Subcutaneous 3 times per day  . insulin aspart  0-15 Units Subcutaneous 6 times per day  . levETIRAcetam  500 mg Intravenous Q12H  . pantoprazole sodium  40 mg Per Tube Q1200    Assessment/Plan: No further seizures noted.  Patient remains intubated and sedated but able to now show some responses.  MRI of the brain personally reviewed and shows no focal metastatic lesions or areas of enhancement.  EEG findings of yesterday may be more representative of a postictal state from a focal onset seizure.  Patient remains on Keppra.   Family making decisions  today about aggressiveness of care.    Recommendations: 1.  Continue Keppra at current dose.   Cased discussed with Dr. Titus Mould     LOS: 1 day   Alexis Goodell, MD [facility] [phone] 12/15/2014  9:29 AM

## 2014-12-15 NOTE — H&P (Signed)
PULMONARY / CRITICAL CARE MEDICINE   Name: Eddie [name] MRN: [id number] DOB: 09/05/1941    ADMISSION DATE:  12/25/2014 CONSULTATION DATE:  12/23/2014  REFERRING MD :  Debby Freiberg MD  CHIEF COMPLAINT:  Seizure/ found down  INITIAL PRESENTATION: Eddie [name] was found unresponsive by family friends this morning while at home around 11:30.  He was last seen normal yesterday at 1:30pm.  They tried calling last night without answer.  EMS was called and patient had a witnessed seizure while en-route, at [facility] he had 2 more witnessed seizures (tonic clonic).  His sister was contacted by EDP who reports he was a full code and wanted everything done, he was intubated in the ED.  STUDIES:  CT head 12/15>> Age- indeterminate left basal ganglia hypodensity. Mri 12/15>>>atrophy neg, no mets   SIGNIFICANT EVENTS: 12/02/2014>> 1 seizure en route, 2 seizures in ED.  SUBJECTIVE:   Coughing with propofol reduced  VITAL SIGNS: Temp:  [98.3 F (36.8 C)-100.9 F (38.3 C)] 98.7 F (37.1 C) (12/16 0032) Pulse Rate:  [63-85] 64 (12/16 0700) Resp:  [14-22] 14 (12/16 0700) BP: (93-204)/(50-112) 104/56 mmHg (12/16 0700) SpO2:  [98 %-100 %] 100 % (12/16 0700) FiO2 (%):  [30 %] 30 % (12/16 0400) Weight:  [82.8 kg (182 lb 8.7 oz)-85 kg (187 lb 6.3 oz)] 85 kg (187 lb 6.3 oz) (12/16 0445) HEMODYNAMICS:   VENTILATOR SETTINGS: Vent Mode:  [-] PRVC FiO2 (%):  [30 %] 30 % Set Rate:  [14 bmp] 14 bmp Vt Set:  [550 mL-580 mL] 580 mL PEEP:  [5 cmH20] 5 cmH20 Plateau Pressure:  [13 cmH20-14 cmH20] 14 cmH20 INTAKE / OUTPUT:  Intake/Output Summary (Last 24 hours) at 12/15/14 0819 Last data filed at 12/15/14 0800  Gross per 24 hour  Intake 470.32 ml  Output   1110 ml  Net -639.68 ml    PHYSICAL EXAMINATION: General:  Patient sedated on going wua Neuro:  Unresponsive to verbal or painful stimuli, flaccid extremities, coughing HEENT:  Pinpoint pupils bilaterally, reactive Cardiovascular:  RRR no  murmur appreciated Lungs:  coarse Abdomen:  soft Musculoskeletal:  2+ lower extremity edema Skin:  Warm/ grossly intact, small skin break at intergluteal cleft  LABS:  CBC  Recent Labs Lab 12/24/2014 1253 12/23/2014 1301 12/15/14 0440  WBC  --  6.6 5.6  HGB 15.0 13.2 10.7*  HCT 44.0 39.1 31.0*  PLT  --  71* 42*   Coag's No results for input(s): APTT, INR in the last 168 hours. BMET  Recent Labs Lab 12/17/2014 1253 12/27/2014 1301 12/15/14 0440  NA 133* 133* 136*  K 5.0 5.2 4.2  CL 102 96 104  CO2  --  16* 20  BUN 26* 25* 31*  CREATININE 1.40* 1.31 1.26  GLUCOSE 142* 140* 124*   Electrolytes  Recent Labs Lab 12/03/2014 1301 12/15/14 0440  CALCIUM 9.2 8.2*   Sepsis Markers  Recent Labs Lab 12/08/2014 1253 12/21/2014 1401  LATICACIDVEN 6.56* 2.5*   ABG  Recent Labs Lab 12/09/2014 1407 12/15/14 0411  PHART 7.396 7.492*  PCO2ART 27.8* 26.9*  PO2ART 103.0* 117.0*   Liver Enzymes  Recent Labs Lab 12/13/2014 1301  AST 94*  ALT 39  ALKPHOS 53  BILITOT 2.6*  ALBUMIN 3.0*   Cardiac Enzymes  Recent Labs Lab 12/26/2014 1301  TROPONINI <0.30  PROBNP 4563.0*   Glucose  Recent Labs Lab 12/13/2014 1240 12/19/2014 1605 12/05/2014 1920 12/15/14 0036 12/15/14 0425  GLUCAP 129* 181* 196* 136* 133*  Imaging Ct Head Wo Contrast  12/22/2014   CLINICAL DATA:  Patient with seizure. No known seizure history. History of metastatic mouth carcinoma.  EXAM: CT HEAD WITHOUT CONTRAST  TECHNIQUE: Contiguous axial images were obtained from the base of the skull through the vertex without intravenous contrast.  COMPARISON:  None.  FINDINGS: Ventricles and sulci are prominent, compatible with atrophy. Periventricular and subcortical white matter hypodensity compatible with chronic small vessel ischemic change. Age indeterminate left basal ganglia hypodensity. No definite intracranial mass lesion or significant mass effect. Calvarium is intact. Paranasal sinuses are well aerated.  Unchanged deformity of the left maxillary sinus. Mastoid air cells are well aerated. Small amount of dependent fluid within the posterior nasopharynx.  IMPRESSION: Age-indeterminate left basal ganglia hypodensity may represent age-indeterminate left basal ganglia infarct.  Non contrast-enhanced examination is limited for the detection of intracranial metastatic disease. Consider further evaluation with contrast-enhanced MRI.   Electronically Signed   By: Lovey Newcomer M.D.   On: 12/23/2014 14:03   Mr Jeri Cos XF Contrast  12/13/2014   CLINICAL DATA:  Initial evaluation for acute seizure.  EXAM: MRI HEAD WITHOUT AND WITH CONTRAST  TECHNIQUE: Multiplanar, multiecho pulse sequences of the brain and surrounding structures were obtained without and with intravenous contrast.  CONTRAST:  43mL MULTIHANCE GADOBENATE DIMEGLUMINE 529 MG/ML IV SOLN  COMPARISON:  Prior CT from earlier the same day.  FINDINGS: Diffuse prominence of the CSF containing spaces is compatible with generalized age-related cerebral atrophy. Patchy and confluent T2/FLAIR hyperintensity within the periventricular and deep white matter both cerebral hemispheres most consistent with chronic small vessel ischemic changes, mild for patient age. Hippocampi are symmetric in size and appearance with normal signal intensity bilaterally.  No abnormal foci of restricted diffusion to suggest acute intracranial infarct. Gray-white matter differentiation maintained. Normal intravascular flow voids present. No intracranial hemorrhage.  No mass lesion, midline shift, or mass effect. No abnormal enhancement on post-contrast sequences. Ventricular prominence related to global parenchymal volume loss present without hydrocephalus. No extra-axial fluid collection.  Craniocervical junction within normal limits. Scattered degenerative changes present within the upper cervical spine.  Pituitary gland normal.  No acute abnormality seen about the orbits.  Scattered  mucoperiosteal thickening present within the ethmoidal air cells bilaterally, right greater than left. Layering fluid signal intensity within the posterior nasopharynx noted, likely related to intubation. No mastoid effusion.  IMPRESSION: 1. No acute intracranial process identified. No abnormal enhancement or intracranial mass lesion. 2. Generalized cerebral atrophy with mild chronic small vessel ischemic disease.   Electronically Signed   By: Jeannine Boga M.D.   On: 12/19/2014 23:32   Dg Chest Port 1 View  12/11/2014   CLINICAL DATA:  Central line placement  EXAM: PORTABLE CHEST - 1 VIEW  COMPARISON:  12/23/2014  FINDINGS: Cardiomediastinal silhouette is stable. Endotracheal tube in place with tip 3.5 cm above the carina. There is a left central line with tip in SVC right atrium junction. No visible pneumothorax. Please note a lung apices are not included on the current. Stable NG tube position  IMPRESSION: Left central line in place. Stable endotracheal and NG tube position. No visible pneumothorax in included lungs. The lung apices are not included.   Electronically Signed   By: Lahoma Crocker M.D.   On: 12/13/2014 16:33   Dg Chest Portable 1 View  12/02/2014   CLINICAL DATA:  Unresponsive check endotracheal tube and nasogastric catheter placement  EXAM: PORTABLE CHEST - 1 VIEW  COMPARISON:  09/28/2011  FINDINGS:  Endotracheal tube is noted approximately 2.8 cm above the carina. A nasogastric catheter is noted within the stomach. The cardiac shadow is within normal limits. The lungs are well aerated bilaterally. No focal infiltrate is seen. Persistent left effusion is noted posteriorly.  IMPRESSION: Tubes and lines as described.  Persistent left-sided effusion.  No acute abnormality is noted.   Electronically Signed   By: Inez Catalina M.D.   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 Julien Nordmann -scd  INFECTIOUS A:  ASP ? 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 No enhancement either P:   RASS goal: 2 propofol for sedation with interuption Keppra Load given in ED Continue Keppra 500mg  BID Neurology appreciated EEG per neuro MRI reviewed   FAMILY  - Updates: Daughter updated by EDP, en route to [facility] currently  - Inter-disciplinary family meet or Palliative Care meeting due by:  12/21/14    TODAY'S SUMMARY: reduce MV, wean attempt, WUA< pall care to meet with fam at 9, continued life support futtile and will allow suffering  Ccm tim 30 min      Raylene Miyamoto, MD  Pulmonary and [place] Pager: [phone]  12/15/2014, 8:19 AM

## 2014-12-15 NOTE — Progress Notes (Signed)
Palliative Care Team at [facility] Progress Note   SUBJECTIVE: Sedated on the ventilator. No new seizure activity. Interval Events: Admitted 12/15 Palliative Consult 12/15- family arrived 12/16  OBJECTIVE: Vital Signs: BP 121/59 mmHg  Pulse 65  Temp(Src) 98.2 F (36.8 C) (Oral)  Resp 14  Ht 5\' 10"  (1.778 m)  Wt 85 kg (187 lb 6.3 oz)  BMI 26.89 kg/m2  SpO2 100%   Intake and Output: 12/15 0701 - 12/16 0700 In: 430.3 [I.V.:210.3; NG/GT:20; IV Piggyback:200] Out: 950 [Urine:950]  Physical Exam: General: Critically ill appearing  Head: ETT, GT  Lungs:  Vent, course  Heart: Regular  Abdomen:  Distended  Extremities: Edema+    No Known Allergies  Medications: Scheduled Meds:  . antiseptic oral rinse  7 mL Mouth Rinse QID  . chlorhexidine  15 mL Mouth Rinse BID  . levETIRAcetam  500 mg Intravenous Q12H  . pantoprazole sodium  40 mg Per Tube Q1200    Continuous Infusions: . propofol 10 mcg/kg/min (12/15/14 0830)    PRN Meds: sodium chloride, fentaNYL, hydrALAZINE  Stool Softner: yes  Palliative Performance Scale: 10%    Labs: CBC    Component Value Date/Time   WBC 5.6 12/15/2014 0440   WBC 3.2* 12/06/2014 1320   RBC 3.02* 12/15/2014 0440   RBC 3.51* 12/06/2014 1320   RBC 3.05* 02/16/2012 0918   HGB 10.7* 12/15/2014 0440   HGB 12.3* 12/06/2014 1320   HCT 31.0* 12/15/2014 0440   HCT 36.6* 12/06/2014 1320   PLT 42* 12/15/2014 0440   PLT 53* 12/06/2014 1320   MCV 102.6* 12/15/2014 0440   MCV 104.3* 12/06/2014 1320   MCH 35.4* 12/15/2014 0440   MCH 35.0* 12/06/2014 1320   MCHC 34.5 12/15/2014 0440   MCHC 33.6 12/06/2014 1320   RDW 14.6 12/15/2014 0440   RDW 15.7* 12/06/2014 1320   LYMPHSABS 0.6* 12/28/2014 1301   LYMPHSABS 0.8* 12/06/2014 1320   MONOABS 0.6 12/19/2014 1301   MONOABS 0.4 12/06/2014 1320   EOSABS 0.0 12/22/2014 1301   EOSABS 0.2 12/06/2014 1320   BASOSABS 0.0 12/28/2014 1301   BASOSABS 0.0 12/06/2014 1320    CMET      Component Value Date/Time   NA 136* 12/15/2014 0440   NA 138 12/06/2014 1322   NA 140 05/23/2012 0925   K 4.2 12/15/2014 0440   K 4.8 12/06/2014 1322   K 5.1* 05/23/2012 0925   CL 104 12/15/2014 0440   CL 101 11/24/2012 1427   CL 97* 05/23/2012 0925   CO2 20 12/15/2014 0440   CO2 22 12/06/2014 1322   CO2 31 05/23/2012 0925   GLUCOSE 124* 12/15/2014 0440   GLUCOSE 99 12/06/2014 1322   GLUCOSE 174* 11/24/2012 1427   GLUCOSE 252* 05/23/2012 0925   BUN 31* 12/15/2014 0440   BUN 10.6 12/06/2014 1322   BUN 11 05/23/2012 0925   CREATININE 1.26 12/15/2014 0440   CREATININE 1.2 12/06/2014 1322   CREATININE 1.3* 05/23/2012 0925   CALCIUM 8.2* 12/15/2014 0440   CALCIUM 9.2 12/06/2014 1322   CALCIUM 9.4 05/23/2012 0925   PROT 6.9 12/01/2014 1301   PROT 6.7 12/06/2014 1322   PROT 8.3* 05/23/2012 0925   ALBUMIN 3.0* 12/17/2014 1301   ALBUMIN 3.0* 12/06/2014 1322   AST 94* 12/24/2014 1301   AST 62* 12/06/2014 1322   AST 51* 05/23/2012 0925   ALT 39 12/13/2014 1301   ALT 36 12/06/2014 1322   ALT 40 05/23/2012 0925   ALKPHOS 53 12/26/2014 1301  ALKPHOS 79 12/06/2014 1322   ALKPHOS 101* 05/23/2012 0925   BILITOT 2.6* 12/18/2014 1301   BILITOT 1.47* 12/06/2014 1322   BILITOT 1.20 05/23/2012 0925   GFRNONAA 56* 12/15/2014 0440   GFRNONAA 64 02/14/2012 1155   GFRAA 65* 12/15/2014 0440   GFRAA 74 02/14/2012 1155     Imaging: Chest Xray Reviewed/ Impressions: Reviewed.  CT scan of the Head Reviewed/Impressions: Reviewed, additionally reviewed MRI of Head-no obvious mets but it was NON CONTRASTED   ASSESSMENT/ PLAN: 1. 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 Metastatic Cancer (throat, prostate, GI malignancy)- peritoneal mets, carcinomatosis poor prognosis-weight loss- inability to take in PO nutrition.  3. Seizure- I suspect this is from ETOH withdrawal- he had become increasingly more confused and per his  sister only able to drink water and was unable to eat food for several days.  Family Meeting held: Sister is [place], he does have a living will. BIL also present.  Social: He was never married, never had children, he was close to his nieces and nephews. He lived alone, independently, had a special friend "Maxine". He was not spiritual and never really maintained employment throughout his life but took care of his mother and lived with her until she died three years.   Recommendation:   Transition to full comfort and extubate on 12/17  Family arriving on 12/17- friends to visit today   No escalation of interventions-keep comfortable on current sedation -planned RMV tomorrow  DNR order  Stopped CBGs  Stopped SQ Heparin    Time In: 9:30 Time Out: 10:30 Total Time Spent with Patient: 60 minutes Total Overall Time: 60 minutes   Greater than 50%  of this time was spent counseling and coordinating care related to the above assessment and plan.   Acquanetta Chain, DO  12/15/2014, 10:32 AM  Please contact Palliative Medicine Team phone at [phone] for questions and concerns.

## 2014-12-16 DIAGNOSIS — T82594A Other mechanical complication of infusion catheter, initial encounter: Secondary | ICD-10-CM | POA: Insufficient documentation

## 2014-12-16 DIAGNOSIS — T82199A Other mechanical complication of unspecified cardiac device, initial encounter: Secondary | ICD-10-CM

## 2014-12-16 LAB — GLUCOSE, CAPILLARY
GLUCOSE-CAPILLARY: 81 mg/dL (ref 70–99)
GLUCOSE-CAPILLARY: 59 mg/dL — AB (ref 70–99)
Glucose-Capillary: 70 mg/dL (ref 70–99)

## 2014-12-16 MED ORDER — LEVETIRACETAM IN NACL 500 MG/100ML IV SOLN
500.0000 mg | Freq: Two times a day (BID) | INTRAVENOUS | Status: DC
  Administered 2014-12-16: 500 mg via INTRAVENOUS
  Filled 2014-12-16 (×2): qty 100

## 2014-12-16 MED ORDER — SODIUM CHLORIDE 0.9 % IV SOLN
INTRAVENOUS | Status: DC
  Administered 2014-12-16 – 2014-12-19 (×4): via INTRAVENOUS
  Filled 2014-12-16 (×7): qty 250

## 2014-12-16 MED ORDER — SODIUM CHLORIDE 0.9 % IV SOLN
10.0000 mg/h | INTRAVENOUS | Status: DC
  Administered 2014-12-16 – 2014-12-18 (×4): 5 mg/h via INTRAVENOUS
  Administered 2014-12-18 – 2014-12-19 (×4): 10 mg/h via INTRAVENOUS
  Filled 2014-12-16 (×9): qty 10

## 2014-12-16 MED ORDER — MORPHINE BOLUS VIA INFUSION
5.0000 mg | INTRAVENOUS | Status: DC | PRN
  Filled 2014-12-16: qty 20

## 2014-12-16 MED ORDER — MORPHINE SULFATE 25 MG/ML IV SOLN
10.0000 mg/h | INTRAVENOUS | Status: DC
  Filled 2014-12-16: qty 10

## 2014-12-16 MED ORDER — MIDAZOLAM BOLUS VIA INFUSION
5.0000 mg | INTRAVENOUS | Status: DC | PRN
  Filled 2014-12-16: qty 20

## 2014-12-16 NOTE — Progress Notes (Signed)
  RT extubated PT based on MD order for Respiratory Therapy Services Guideline for Withdrawal Of Life Sustaining Treatment- RN aware.

## 2014-12-16 NOTE — Progress Notes (Signed)
eLink Physician-Brief Progress Note Patient Name: Eddie [name] DOB: March 08, 1941 MRN: [id number]   Date of Service  12/16/2014  HPI/Events of Note  Case discussed with nursing, attending MD and patient's sister Claudette Stapler.  Thomasina wishes to withdraw care, but she wants to see her brother one more time.  eICU Interventions  Extubation (palliative, one way) after sister visits this evening.     Intervention Category Major Interventions: End of life / care limitation discussion  MCQUAID, DOUGLAS 12/16/2014, 5:18 PM

## 2014-12-16 NOTE — Progress Notes (Signed)
Chaplain checked in on pt, no family present and pt not responsive.  Please page chaplain when family arrive.  [phone]   12/16/14 1200  Clinical Encounter Type  Visited With Patient;Health care provider  Visit Type Initial;Critical Care  Referral From Physician  Stress Factors  Patient Stress Factors Exhausted;Health changes  Mertie Moores, Chaplain

## 2014-12-16 NOTE — H&P (Signed)
PULMONARY / CRITICAL CARE MEDICINE   Name: Eddie [name] MRN: [id number] DOB: 21-Mar-1941    ADMISSION DATE:  12/17/2014 CONSULTATION DATE:  12/18/2014  REFERRING MD :  Debby Freiberg MD  CHIEF COMPLAINT:  Seizure/ found down  INITIAL PRESENTATION: Eddie [name] was found unresponsive by family friends this morning while at home around 11:30.  He was last seen normal yesterday at 1:30pm.  They tried calling last night without answer.  EMS was called and patient had a witnessed seizure while en-route, at [facility] he had 2 more witnessed seizures (tonic clonic).  His sister was contacted by EDP who reports he was a full code and wanted everything done, he was intubated in the ED.  STUDIES:  CT head 12/15>> Age- indeterminate left basal ganglia hypodensity. Mri 12/15>>>atrophy neg, no mets   SIGNIFICANT EVENTS: 12/07/2014>> 1 seizure en route, 2 seizures in ED. 12/16- DNR established, no escallation  SUBJECTIVE:  No pressors, more awake  VITAL SIGNS: Temp:  [97.4 F (36.3 C)-98.5 F (36.9 C)] 97.7 F (36.5 C) (12/17 1205) Pulse Rate:  [58-71] 71 (12/17 0825) Resp:  [10-17] 17 (12/17 0825) BP: (79-124)/(49-99) 95/50 mmHg (12/17 0825) SpO2:  [100 %] 100 % (12/17 0825) FiO2 (%):  [30 %] 30 % (12/17 0825) Weight:  [84.6 kg (186 lb 8.2 oz)] 84.6 kg (186 lb 8.2 oz) (12/17 0500) HEMODYNAMICS:   VENTILATOR SETTINGS: Vent Mode:  [-] PRVC FiO2 (%):  [30 %] 30 % Set Rate:  [10 bmp] 10 bmp Vt Set:  [580 mL] 580 mL PEEP:  [5 cmH20] 5 cmH20 Plateau Pressure:  [13 cmH20-16 cmH20] 15 cmH20 INTAKE / OUTPUT:  Intake/Output Summary (Last 24 hours) at 12/16/14 1215 Last data filed at 12/16/14 1100  Gross per 24 hour  Intake 848.58 ml  Output    400 ml  Net 448.58 ml    PHYSICAL EXAMINATION: General:  Not following commands but agitation with WUA Neuro:  Grimacing to pain, moving head HEENT:  Pinpoint pupils bilaterally, reactive Cardiovascular:  RRR no murmur appreciated Lungs:   coarse Abdomen:  Soft, BS hypo, ascites with wave Musculoskeletal:  2+ lower extremity edema Skin:  Warm/ grossly intact, small skin break at intergluteal cleft  LABS:  CBC  Recent Labs Lab 12/29/2014 1253 12/08/2014 1301 12/15/14 0440  WBC  --  6.6 5.6  HGB 15.0 13.2 10.7*  HCT 44.0 39.1 31.0*  PLT  --  71* 42*   Coag's No results for input(s): APTT, INR in the last 168 hours. BMET  Recent Labs Lab 12/19/2014 1253 12/29/2014 1301 12/15/14 0440  NA 133* 133* 136*  K 5.0 5.2 4.2  CL 102 96 104  CO2  --  16* 20  BUN 26* 25* 31*  CREATININE 1.40* 1.31 1.26  GLUCOSE 142* 140* 124*   Electrolytes  Recent Labs Lab 12/29/2014 1301 12/15/14 0440  CALCIUM 9.2 8.2*   Sepsis Markers  Recent Labs Lab 12/28/2014 1253 12/18/2014 1401  LATICACIDVEN 6.56* 2.5*   ABG  Recent Labs Lab 12/13/2014 1407 12/15/14 0411  PHART 7.396 7.492*  PCO2ART 27.8* 26.9*  PO2ART 103.0* 117.0*   Liver Enzymes  Recent Labs Lab 12/12/2014 1301  AST 94*  ALT 39  ALKPHOS 53  BILITOT 2.6*  ALBUMIN 3.0*   Cardiac Enzymes  Recent Labs Lab 12/06/2014 1301  TROPONINI <0.30  PROBNP 4563.0*   Glucose  Recent Labs Lab 12/15/14 1220 12/15/14 1607 12/15/14 1904 12/15/14 2323 12/16/14 0338 12/16/14 0817  GLUCAP 95 82 79 81  70 59*    Imaging Dg Chest Port 1 View  12/15/2014   CLINICAL DATA:  73 year old male with respiratory failure. Acute seizure. Initial encounter.  EXAM: PORTABLE CHEST - 1 VIEW  COMPARISON:  12/30/2014 and earlier.  FINDINGS: Portable AP semi upright view at 0510 hrs. Stable endotracheal tube tip between the clavicles and carina. Stable left IJ central line. Stable enteric tube, side hole projects at the proximal stomach.  Continued dense retrocardiac opacity. Increased veiling opacity on the left with obscuration of the left hemidiaphragm. New patchy and confluent right medial lung base opacity. No pneumothorax or pulmonary edema.  IMPRESSION: 1.  Stable lines and tubes.  2. Worsening bibasilar ventilation: Left side pleural effusion and lower lobe collapse or consolidation appears increased, While on the right there is new confluent opacity suspicious for acute pneumonia.   Electronically Signed   By: Lars Pinks M.D.   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

## 2014-12-16 NOTE — Procedures (Signed)
Extubation Procedure Note  Patient Details:   Name: Eddie [name] DOB: May 05, 1941 MRN: [id number]   Airway Documentation:     Evaluation  O2 sats: stable throughout Complications: No apparent complications Patient did tolerate procedure well. Bilateral Breath Sounds: Clear, Diminished Suctioning: Airway No  Baldwin [place] Nannette 12/16/2014, 6:34 PM

## 2014-12-16 NOTE — Progress Notes (Signed)
Palliative Medicine Team  Goal for full comfort established yesterday. Planned extubation this evening- discussed with RN. E-link to initiate extubation order after family sees patient. Comfort meds already started. PMT available for any symptom management needs and to support family. Suspect he will have a rapid decline.  Lane Hacker, DO Palliative Medicine

## 2014-12-17 ENCOUNTER — Ambulatory Visit (HOSPITAL_COMMUNITY): Admission: RE | Admit: 2014-12-17 | Payer: Medicare Other | Source: Ambulatory Visit

## 2014-12-17 NOTE — Progress Notes (Signed)
Very comfortable on morphine infusion. Unresponsive No family @ bedside Transfer to palliative care floor to continue full comfort care  Merton Border, MD ; [facility] service [place] [phone].  After 5:30 PM or weekends, call [phone]

## 2014-12-17 NOTE — Progress Notes (Signed)
Patient to be transferred to [facility] [place] room 6.  Report called to Kathrine Cords, RN.  Will monitor.

## 2014-12-18 DIAGNOSIS — Z515 Encounter for palliative care: Secondary | ICD-10-CM

## 2014-12-18 DIAGNOSIS — R4020 Unspecified coma: Secondary | ICD-10-CM

## 2014-12-18 NOTE — Progress Notes (Addendum)
PULMONARY / CRITICAL CARE MEDICINE   Name: Eddie [name] MRN: [id number] DOB: 02-06-1941    ADMISSION DATE:  12/13/2014 CONSULTATION DATE:  12/06/2014  REFERRING MD :  Debby Freiberg MD  CHIEF COMPLAINT:  Seizure/ found down  INITIAL PRESENTATION: Eddie [name] was found unresponsive by family friends this morning while at home around 11:30.  He was last seen normal yesterday at 1:30pm.  They tried calling last night without answer.  EMS was called and patient had a witnessed seizure while en-route, at [facility] he had 2 more witnessed seizures (tonic clonic).  His sister was contacted by EDP who reports he was a full code and wanted everything done, he was intubated in the ED.  STUDIES:  CT head 12/15>> Age- indeterminate left basal ganglia hypodensity. Mri 12/15>>>atrophy neg, no mets   SIGNIFICANT EVENTS: 12/26/2014>> 1 seizure en route, 2 seizures in ED. 12/16- DNR established, no escallation 12/18 Full comfort care , morphine drip, tfr to pall care unit  SUBJECTIVE:   Unresponsive, moribund  VITAL SIGNS: Pulse Rate:  [61-63] 63 (12/19 0501) Resp:  [9] 9 (12/18 1100) BP: (79-87)/(10-51) 79/10 mmHg (12/19 0501) SpO2:  [34 %-96 %] 34 % (12/19 0501)  Intake/Output Summary (Last 24 hours) at 12/18/14 1012 Last data filed at 12/18/14 0504  Gross per 24 hour  Intake    190 ml  Output    200 ml  Net    -10 ml    PHYSICAL EXAMINATION: BP 79/10 mmHg  Pulse 63  Temp(Src) 98.1 F (36.7 C) (Oral)  Resp 9  Ht 5\' 10"  (1.778 m)  Wt 84.6 kg (186 lb 8.2 oz)  BMI 26.76 kg/m2  SpO2 34%  General:  unresponsive Neuro:  COMATOSE HEENT:  Pinpoint pupils bilaterally, reactive Cardiovascular:  RRR no murmur appreciated Lungs:  coarse Abdomen:  Soft, BS hypo, ascites with wave Musculoskeletal:  2+ lower extremity edema Skin:  Warm/ grossly intact, small skin break at intergluteal cleft  LABS:  CBC  Recent Labs Lab 12/09/2014 1253 12/28/2014 1301 12/15/14 0440  WBC  --  6.6  5.6  HGB 15.0 13.2 10.7*  HCT 44.0 39.1 31.0*  PLT  --  71* 42*   Coag's No results for input(s): APTT, INR in the last 168 hours. BMET  Recent Labs Lab 12/25/2014 1253 12/09/2014 1301 12/15/14 0440  NA 133* 133* 136*  K 5.0 5.2 4.2  CL 102 96 104  CO2  --  16* 20  BUN 26* 25* 31*  CREATININE 1.40* 1.31 1.26  GLUCOSE 142* 140* 124*   Electrolytes  Recent Labs Lab 12/23/2014 1301 12/15/14 0440  CALCIUM 9.2 8.2*   Sepsis Markers  Recent Labs Lab 12/19/2014 1253 12/09/2014 1401  LATICACIDVEN 6.56* 2.5*   ABG  Recent Labs Lab 12/06/2014 1407 12/15/14 0411  PHART 7.396 7.492*  PCO2ART 27.8* 26.9*  PO2ART 103.0* 117.0*   Liver Enzymes  Recent Labs Lab 12/02/2014 1301  AST 94*  ALT 39  ALKPHOS 53  BILITOT 2.6*  ALBUMIN 3.0*   Cardiac Enzymes  Recent Labs Lab 12/19/2014 1301  TROPONINI <0.30  PROBNP 4563.0*   Glucose  Recent Labs Lab 12/15/14 1220 12/15/14 1607 12/15/14 1904 12/15/14 2323 12/16/14 0338 12/16/14 0817  GLUCAP 95 82 79 81 70 59*    Imaging No results found.   ASSESSMENT / PLAN: Principal Problem:   Comfort measures only status Active Problems:   Seizure   Palliative care encounter   Acute encephalopathy   Status epilepticus  DNR (do not resuscitate)   Metastatic cancer   Central line clotted   Coma   PULMONARY OETT 12/09/2014>>12/17 A:  Acute respiratory failure in setting of seizures Left pleural effusion Aspiration P:   Comfort care only  NEUROLOGIC A:  New onset seizures, MRI neg mets, poor outcome P:   Comfort care only Pt will not survive to discharge.  Mariel Sleet Beeper  [phone]  Cell  [phone]  If no response or cell goes to voicemail, call beeper [phone] 10:14 AM 12/18/2014

## 2014-12-18 NOTE — Progress Notes (Signed)
Palliative Medicine Team Progress Note  Mr. Wik is currently on full palliative sedation following terminal extubation 2 days ago- it is remarkable that he has survived this long-anticipate his death at any time- prognosis: hours. No family at bedside.   Morphine is currently at max rate of 10/hr and Versed is at5/hr from ICU withdrawal protocol.  Our team will follow.  Lane Hacker, DO Palliative Medicine [phone]

## 2014-12-19 NOTE — Progress Notes (Signed)
PULMONARY / CRITICAL CARE MEDICINE   Name: [name] MRN: [id number] DOB: 07/08/1941    ADMISSION DATE:  12/22/2014 CONSULTATION DATE:  12/23/2014  REFERRING MD :  Debby Freiberg MD  CHIEF COMPLAINT:  Seizure/ found down  INITIAL PRESENTATION: Eddie [name] was found unresponsive by family friends this morning while at home around 11:30.  He was last seen normal yesterday at 1:30pm.  They tried calling last night without answer.  EMS was called and patient had a witnessed seizure while en-route, at [facility] he had 2 more witnessed seizures (tonic clonic).  His sister was contacted by EDP who reports he was a full code and wanted everything done, he was intubated in the ED.  STUDIES:  CT head 12/15>> Age- indeterminate left basal ganglia hypodensity. Mri 12/15>>>atrophy neg, no mets   SIGNIFICANT EVENTS: 12/19/2014>> 1 seizure en route, 2 seizures in ED. 12/16- DNR established, no escallation 12/18 Full comfort care , morphine drip, tfr to pall care unit  SUBJECTIVE:   No changes Moribund  VITAL SIGNS: Pulse Rate:  [50] 50 (12/20 0207) Resp:  [8] 8 (12/20 0207) BP: (63)/(43) 63/43 mmHg (12/20 0207) SpO2:  [95 %] 95 % (12/20 0207)  Intake/Output Summary (Last 24 hours) at 12/19/14 1421 Last data filed at 12/19/14 0900  Gross per 24 hour  Intake 559.25 ml  Output    100 ml  Net 459.25 ml    PHYSICAL EXAMINATION: BP 63/43 mmHg  Pulse 50  Temp(Src) 98.1 F (36.7 C) (Oral)  Resp 8  Ht 5\' 10"  (1.778 m)  Wt 84.6 kg (186 lb 8.2 oz)  BMI 26.76 kg/m2  SpO2 95%  General:  unresponsive Neuro:  COMATOSE HEENT:  Pinpoint pupils bilaterally, reactive Cardiovascular:  RRR no murmur appreciated Lungs:  coarse Abdomen:  Soft, BS hypo, ascites with wave Musculoskeletal:  2+ lower extremity edema Skin:  Warm/ grossly intact, small skin break at intergluteal cleft  LABS:  CBC  Recent Labs Lab 12/09/2014 1253 11/30/2014 1301 12/15/14 0440  WBC  --  6.6 5.6  HGB 15.0  13.2 10.7*  HCT 44.0 39.1 31.0*  PLT  --  71* 42*   Coag's No results for input(s): APTT, INR in the last 168 hours. BMET  Recent Labs Lab 12/27/2014 1253 12/26/2014 1301 12/15/14 0440  NA 133* 133* 136*  K 5.0 5.2 4.2  CL 102 96 104  CO2  --  16* 20  BUN 26* 25* 31*  CREATININE 1.40* 1.31 1.26  GLUCOSE 142* 140* 124*   Electrolytes  Recent Labs Lab 12/19/2014 1301 12/15/14 0440  CALCIUM 9.2 8.2*   Sepsis Markers  Recent Labs Lab 12/29/2014 1253 12/09/2014 1401  LATICACIDVEN 6.56* 2.5*   ABG  Recent Labs Lab 11/30/2014 1407 12/15/14 0411  PHART 7.396 7.492*  PCO2ART 27.8* 26.9*  PO2ART 103.0* 117.0*   Liver Enzymes  Recent Labs Lab 12/19/2014 1301  AST 94*  ALT 39  ALKPHOS 53  BILITOT 2.6*  ALBUMIN 3.0*   Cardiac Enzymes  Recent Labs Lab 12/19/2014 1301  TROPONINI <0.30  PROBNP 4563.0*   Glucose  Recent Labs Lab 12/15/14 1220 12/15/14 1607 12/15/14 1904 12/15/14 2323 12/16/14 0338 12/16/14 0817  GLUCAP 95 82 79 81 70 59*    Imaging No results found.   ASSESSMENT / PLAN: Principal Problem:   Comfort measures only status Active Problems:   Seizure   Palliative care encounter   Acute encephalopathy   Status epilepticus   DNR (do not resuscitate)  Metastatic cancer   Central line clotted   Coma   PULMONARY OETT 12/27/2014>>12/17 A:  Acute respiratory failure in setting of seizures Left pleural effusion Aspiration P:   Comfort care only  NEUROLOGIC A:  New onset seizures, MRI neg mets, poor outcome P:   Comfort care only Pt will not survive to discharge.  Mariel Sleet Beeper  [phone]  Cell  [phone]  If no response or cell goes to voicemail, call beeper [phone] 2:21 PM 12/19/2014

## 2014-12-21 ENCOUNTER — Ambulatory Visit (HOSPITAL_COMMUNITY): Payer: Medicare Other

## 2014-12-23 ENCOUNTER — Ambulatory Visit: Payer: Medicare Other | Admitting: Internal Medicine

## 2014-12-31 NOTE — Progress Notes (Signed)
Pt died 0051 verified by another nurse Eddie Cory, RN, informed Dr. Benjamine Mola  Deterding c/o Pam, his sister Eddie [name], [place] donor informed about time of death he is not candidate for organ donor, charge nurse made aware, transfer the body to morgue.

## 2014-12-31 NOTE — Progress Notes (Signed)
Pt morphine drip 200 ml and versed drip 20 ml  Wasted in sink verified by [employer], RN

## 2014-12-31 DEATH — deceased

## 2015-01-10 NOTE — Discharge Summary (Signed)
[name], [name] NO.:  [id number]  MEDICAL RECORD NO.:  [id number]  LOCATION:                                 FACILITY:  PHYSICIAN:  Raylene Miyamoto, MD DATE OF BIRTH:  1941/12/08  DATE OF ADMISSION:  12/26/2014 DATE OF DISCHARGE:  Jan 15, 2015                              DISCHARGE SUMMARY   DEATH SUMMARY  This is a 74 year old patient found unresponsive by family the morning of admission, November, 30, 2015, last seen normal at 1:30 p.m.  Family members were then trying to call night prior.  EMS was called and the patient was found to have witnessed seizures while en route to [facility], tonic-clonic in nature.  CT scan of head on December 14, 2014, showed age-indeterminate left basal ganglia hypodensity.  MRI in December 2015 showed atrophy, negative for any mets.  Essentially, the patient required intubation secondary to seizures, also noted to have aspiration, left pleural effusion.  He was treated with propofol, has chronic kidney disease as well.  Noted to have blood in the urine.  He had history of mesenteric vein thrombosis, multiple malignancies, thrombocytopenia, was treated with antibiotics initially and then were discontinued due to lack of evidence of active infection, was severely malnourished, had new onset seizures.  No mets were noted on MRI, but with such a poor neurologic status and other medical comorbidities giving rise to a poor outcome, family decided for comfort care and comfort care was provided.  To note, the patient was treated aggressively with Keppra.  The patient was extubated and morphine drip was required.  Palliative care was also involved and the patient expired with dignity.  FINAL DIAGNOSES UPON DEATH: 1. 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 2. Acute respiratory failure. 3. History of multiple malignancies. 4. MRI without metastatic disease. 5. Severe  protein malnutrition in the setting of multiple     comorbidities.     Raylene Miyamoto, MD     DJF/MEDQ  D:  01/10/2015  T:  01/10/2015  Job:  [id number]

## 2016-01-16 IMAGING — MR MR HEAD WO/W CM
10 of 14 series · 32 of 48 positions shown · IV contrast (multihance)
Comparison: Prior CT from earlier the same day.

CLINICAL DATA: Initial evaluation for acute seizure.

EXAM:
MRI HEAD WITHOUT AND WITH CONTRAST
TECHNIQUE: Multiplanar, multiecho pulse sequences of the brain and surrounding
structures were obtained without and with intravenous contrast.
CONTRAST:  18mL MULTIHANCE GADOBENATE DIMEGLUMINE 529 MG/ML IV SOLN

[Series 3: DWI · axial · 3.0mm · 1.09mm/px · z∈[-68,+75]mm · 8 of 98 slices shown (1 of 4)]
[im 1/98]
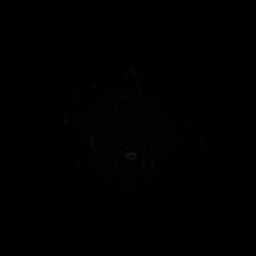
[im 14/98]
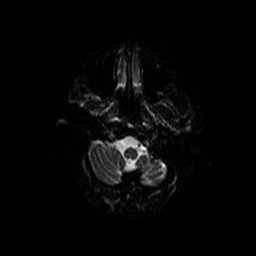
[im 28/98]
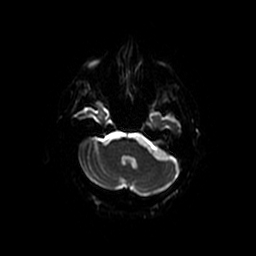
[im 42/98]
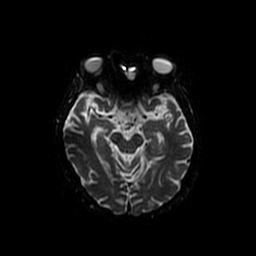
[im 56/98]
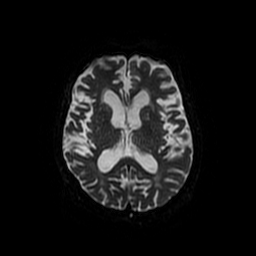
[im 70/98]
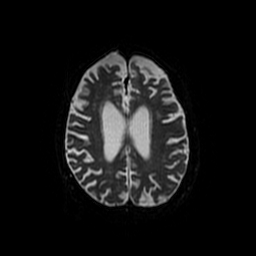
[im 84/98]
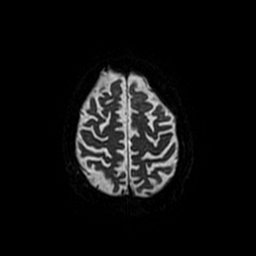
[im 98/98]
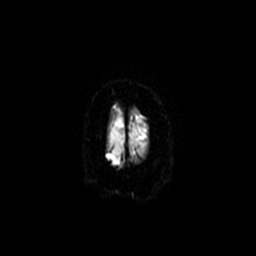

[Series 5: DWI · coronal · 5.0mm · 1.09mm/px · 5 of 66 slices shown (2 of 4)]
[im 1/66]
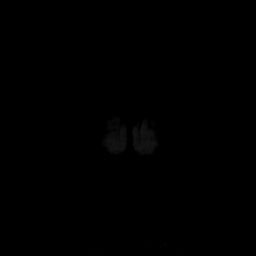
[im 17/66]
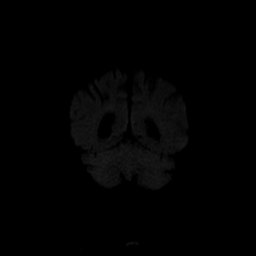
[im 33/66]
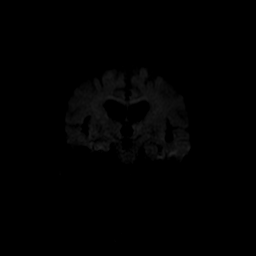
[im 49/66]
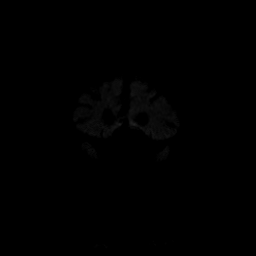
[im 66/66]
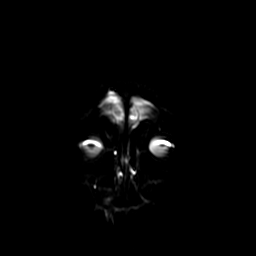

[Series 6: T2 · axial · 5.0mm · 0.43mm/px · z∈[-68,+75]mm · 2 of 25 slices shown (1 of 3)]
[im 1/25]
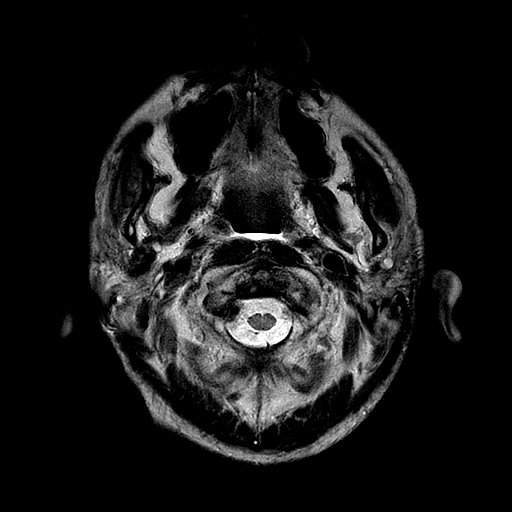
[im 25/25]
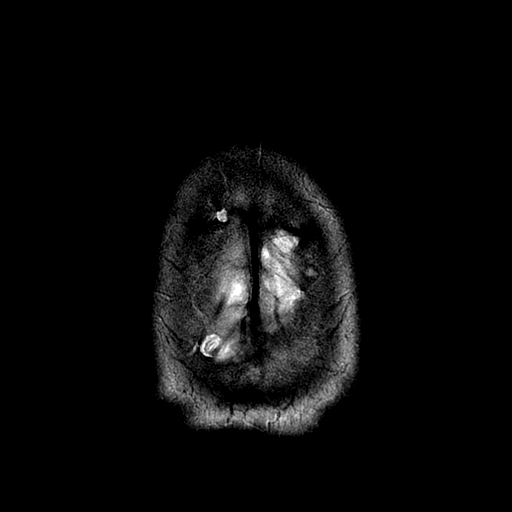

[Series 7: FLAIR · axial · 5.0mm · 0.43mm/px · z∈[-68,+75]mm · 2 of 25 slices shown]
[im 1/25]
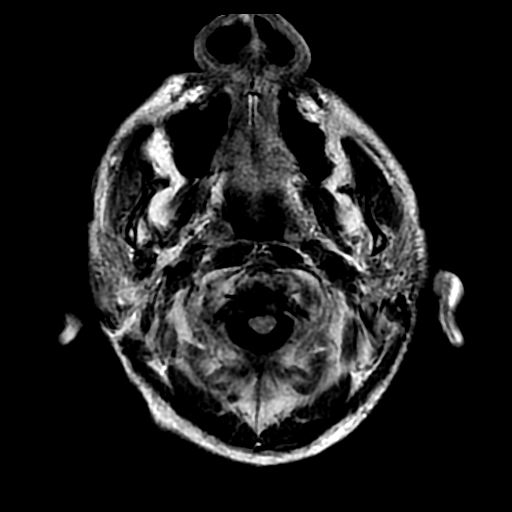
[im 25/25]
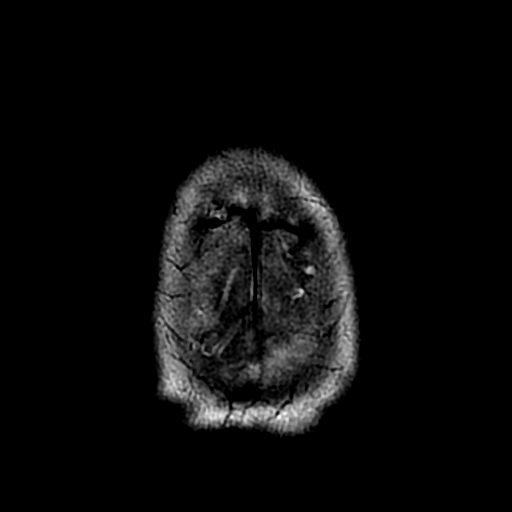

[Series 10: T2 · coronal · 3.0mm · 0.35mm/px · 2 of 26 slices shown (2 of 3)]
[im 1/26]
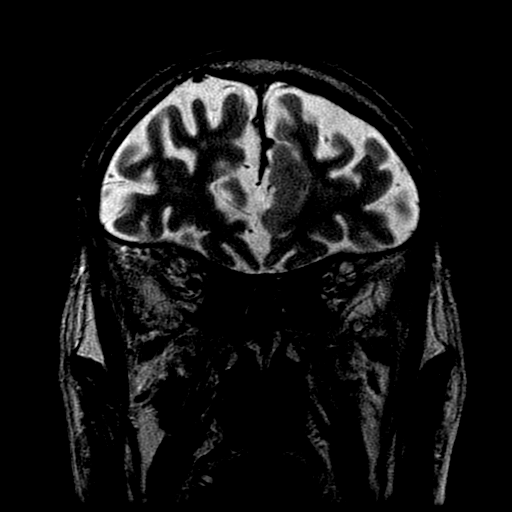
[im 26/26]
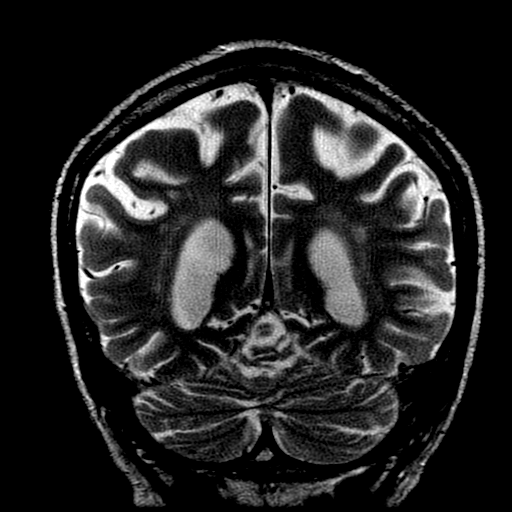

[Series 11: T2 · coronal · 5.0mm · 0.43mm/px · 2 of 28 slices shown (3 of 3)]
[im 1/28]
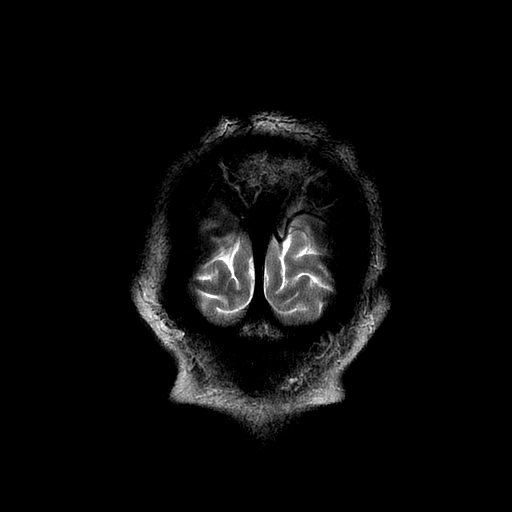
[im 28/28]
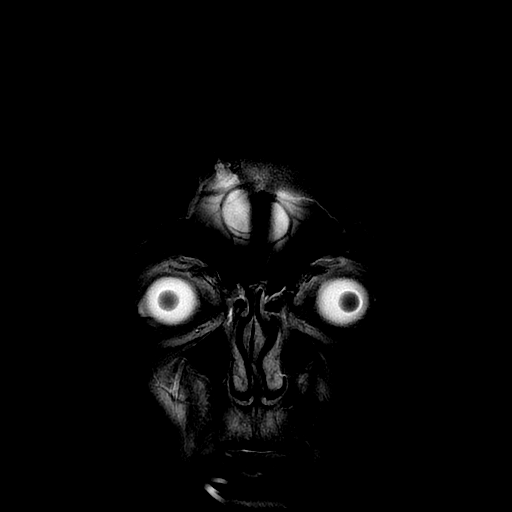

[Series 13: T1 post-contrast · coronal · 5.0mm · 0.43mm/px · 2 of 28 slices shown (1 of 2)]
[im 1/28]
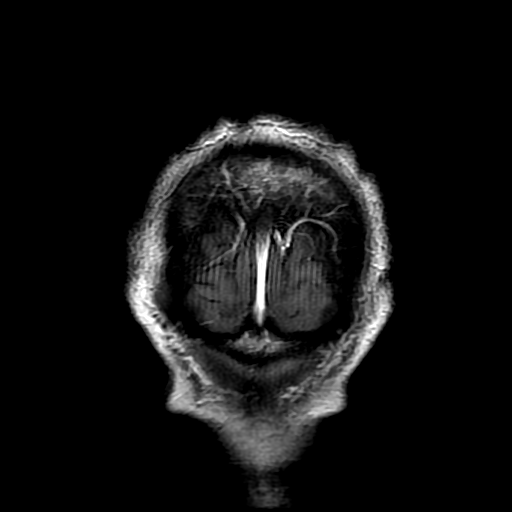
[im 28/28]
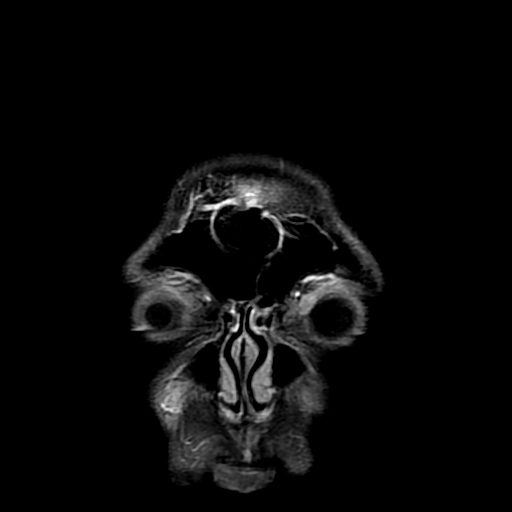

[Series 14: T1 post-contrast · sagittal · 5.0mm · 0.47mm/px · 2 of 23 slices shown (2 of 2)]
[im 1/23]
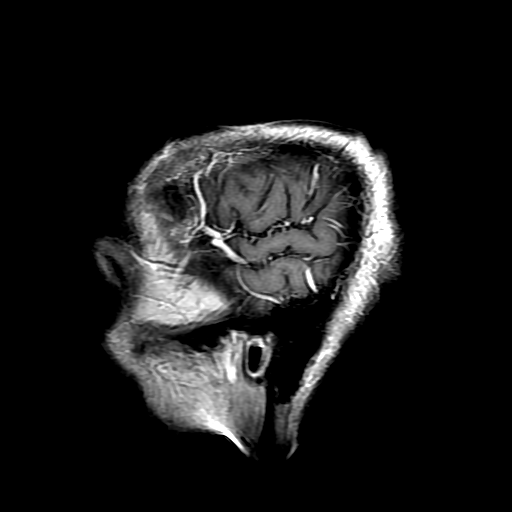
[im 23/23]
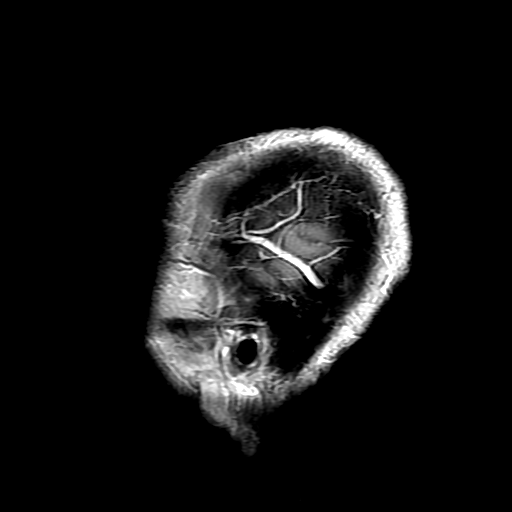

[Series 300: DWI · axial · 3.0mm · 1.09mm/px · z∈[-68,+75]mm · 4 of 49 slices shown (3 of 4)]
[im 1/49]
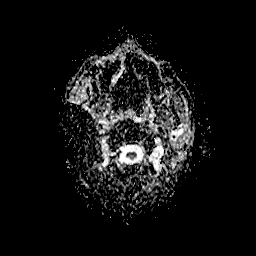
[im 17/49]
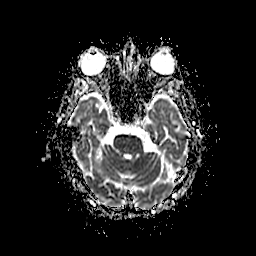
[im 33/49]
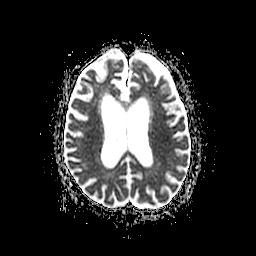
[im 49/49]
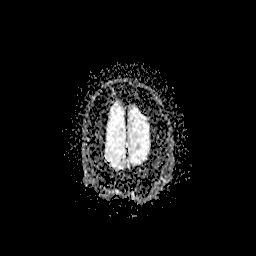

[Series 500: DWI · coronal · 5.0mm · 1.09mm/px · 3 of 33 slices shown (4 of 4)]
[im 1/33]
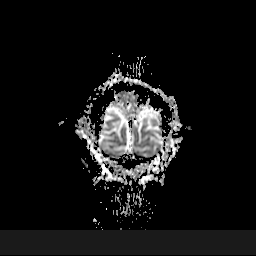
[im 17/33]
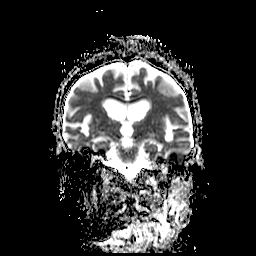
[im 33/33]
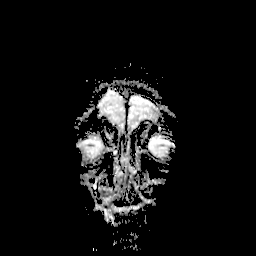

[32 of 48 positions shown; findings below may reference images not displayed]

FINDINGS: Diffuse prominence of the CSF containing spaces is compatible with
generalized age-related cerebral atrophy. Patchy and confluent
T2/FLAIR hyperintensity within the periventricular and deep white
matter both cerebral hemispheres most consistent with chronic small
vessel ischemic changes, mild for patient age. Hippocampi are
symmetric in size and appearance with normal signal intensity
bilaterally.

No abnormal foci of restricted diffusion to suggest acute
intracranial infarct. Gray-white matter differentiation maintained.
Normal intravascular flow voids present. No intracranial hemorrhage.

No mass lesion, midline shift, or mass effect. No abnormal
enhancement on post-contrast sequences. Ventricular prominence
related to global parenchymal volume loss present without
hydrocephalus. No extra-axial fluid collection.

Craniocervical junction within normal limits. Scattered degenerative
changes present within the upper cervical spine.

Pituitary gland normal.  No acute abnormality seen about the orbits.

Scattered mucoperiosteal thickening present within the ethmoidal air
cells bilaterally, right greater than left. Layering fluid signal
intensity within the posterior nasopharynx noted, likely related to
intubation. No mastoid effusion.
IMPRESSION: 1. No acute intracranial process identified. No abnormal enhancement
or intracranial mass lesion.
2. Generalized cerebral atrophy with mild chronic small vessel
ischemic disease.

## 2016-01-16 IMAGING — CR DG CHEST 1V PORT
1 series · 1 of 1 positions shown · non-contrast
Comparison: 12/14/2014

CLINICAL DATA: Central line placement

EXAM:
PORTABLE CHEST - 1 VIEW

[portable]
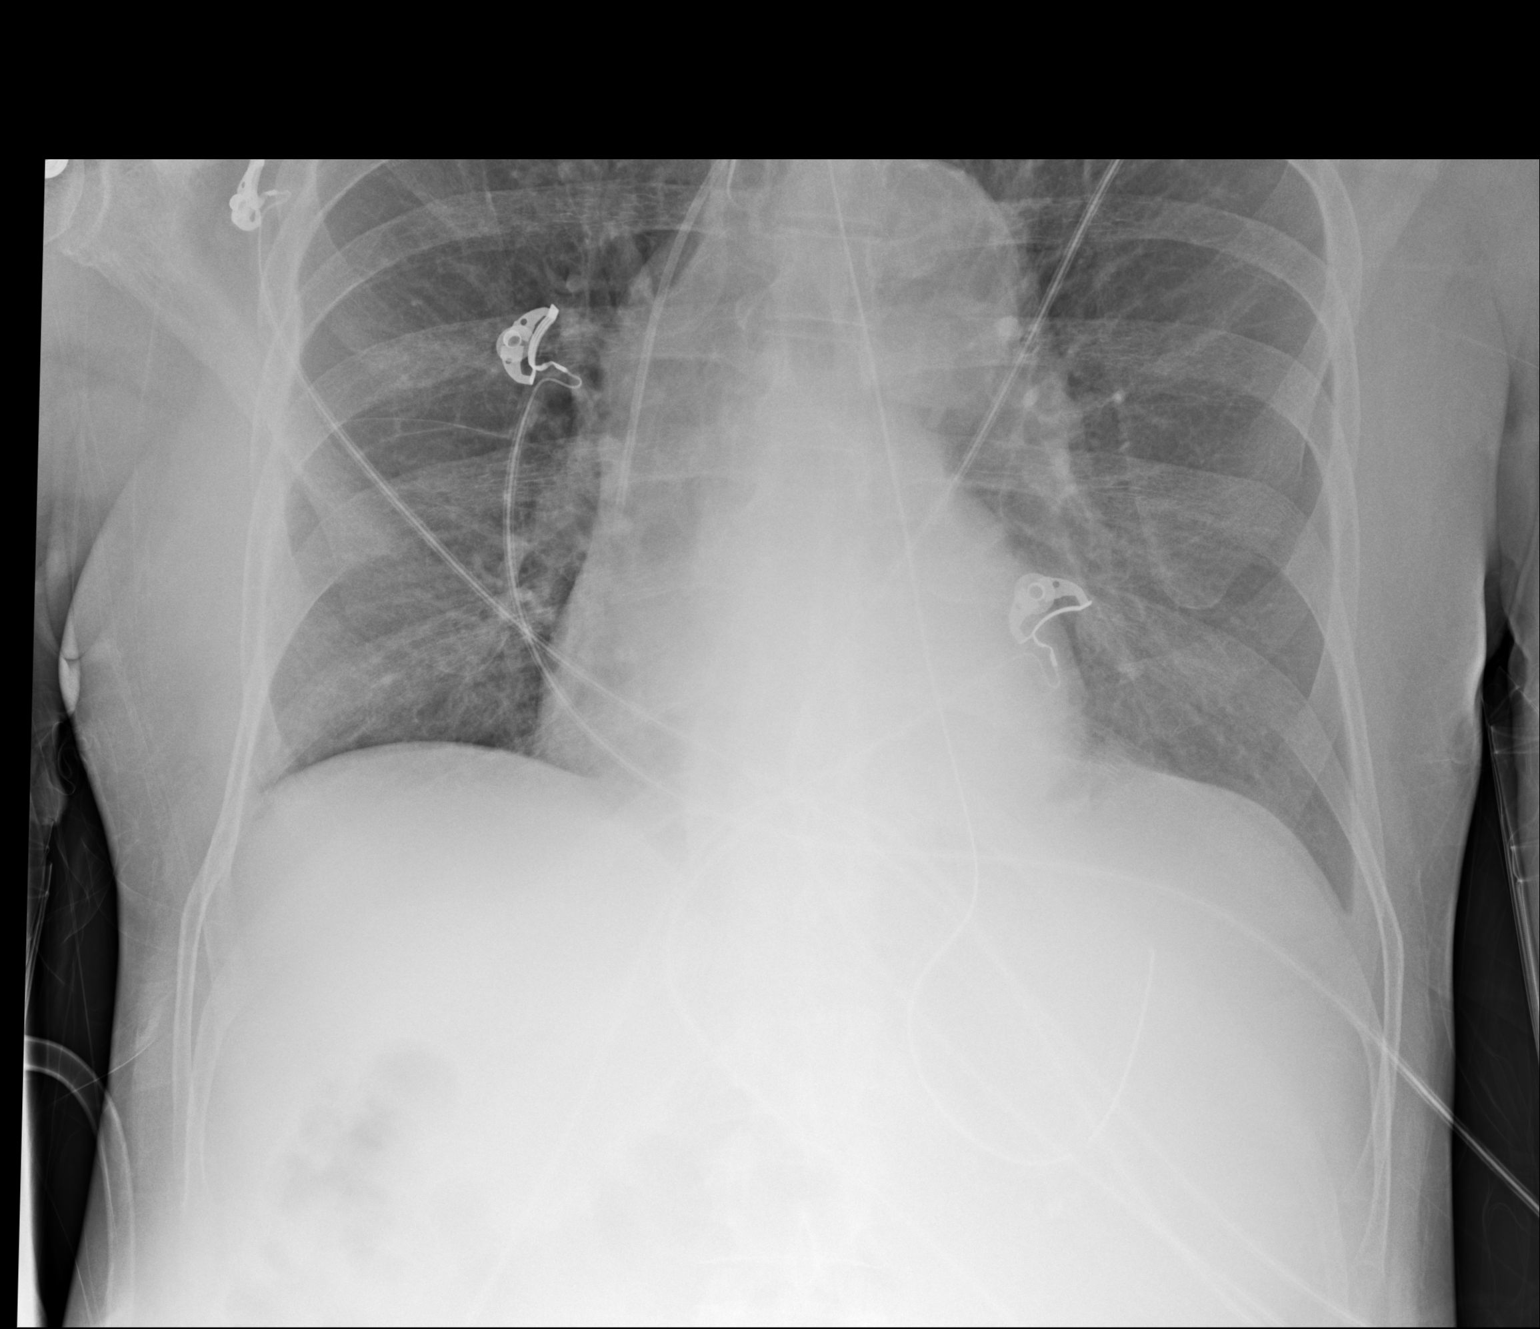

[1 of 1 positions shown; findings below may reference images not displayed]

FINDINGS: Cardiomediastinal silhouette is stable. Endotracheal tube in place
with tip 3.5 cm above the carina. There is a left central line with
tip in SVC right atrium junction. No visible pneumothorax. Please
note a lung apices are not included on the current. Stable NG tube
position
IMPRESSION: Left central line in place. Stable endotracheal and NG tube
position. No visible pneumothorax in included lungs. The lung apices
are not included.

## 2016-01-17 IMAGING — CR DG CHEST 1V PORT
1 series · 1 of 1 positions shown · non-contrast
Comparison: 12/14/2014 and earlier.

CLINICAL DATA: 73-year-old male with respiratory failure. Acute
seizure. Initial encounter.

EXAM:
PORTABLE CHEST - 1 VIEW

[AP]
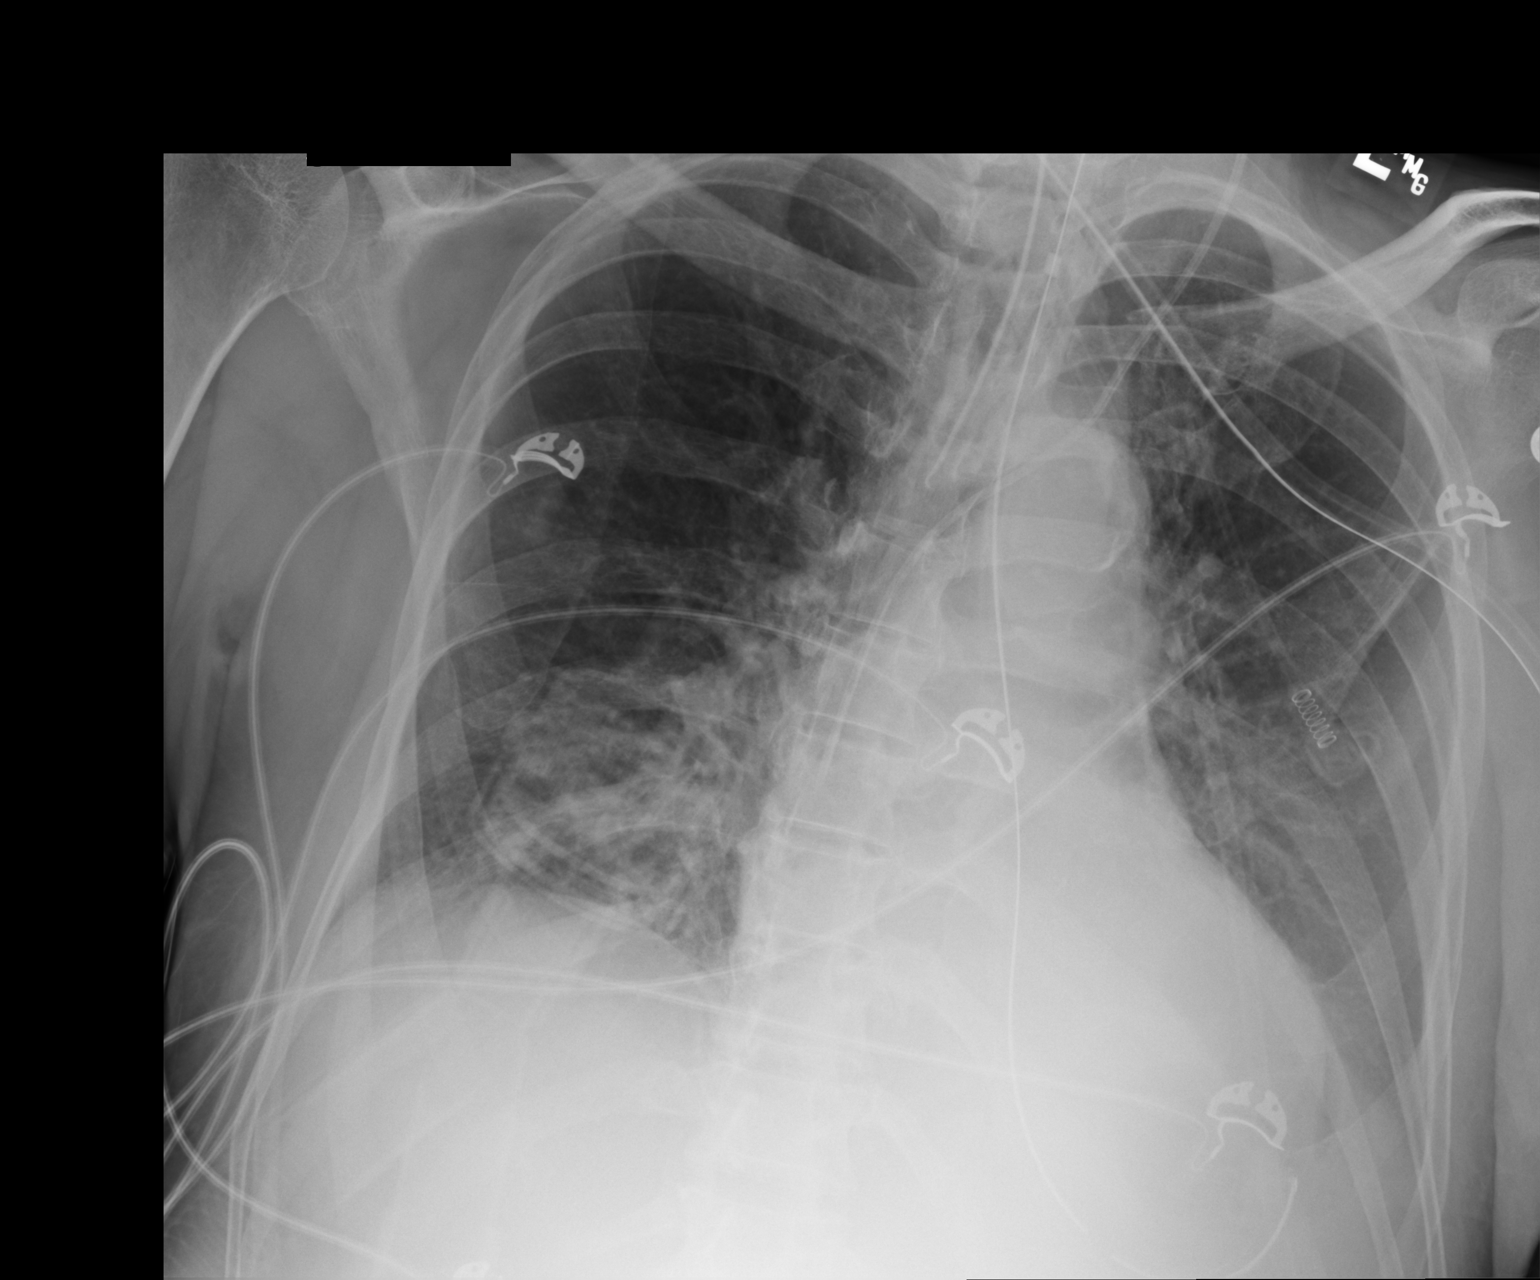

[1 of 1 positions shown; findings below may reference images not displayed]

FINDINGS: Portable AP semi upright view at 8308 hrs. Stable endotracheal tube
tip between the clavicles and carina. Stable left IJ central line.
Stable enteric tube, side hole projects at the proximal stomach.

Continued dense retrocardiac opacity. Increased veiling opacity on
the left with obscuration of the left hemidiaphragm. New patchy and
confluent right medial lung base opacity. No pneumothorax or
pulmonary edema.
IMPRESSION: 1.  Stable lines and tubes.
2. Worsening bibasilar ventilation:
Left side pleural effusion and lower lobe collapse or consolidation
appears increased,
While on the right there is new confluent opacity suspicious for
acute pneumonia.
